# Patient Record
Sex: Female | Born: 1990 | Race: White | Hispanic: No | Marital: Single | Smoking: Former smoker
Health system: Southern US, Community
[De-identification: ages and names within clinical notes are randomized; demographics above are authoritative.]

## PROBLEM LIST (undated history)

## (undated) DIAGNOSIS — G43909 Migraine, unspecified, not intractable, without status migrainosus: Secondary | ICD-10-CM

## (undated) DIAGNOSIS — N939 Abnormal uterine and vaginal bleeding, unspecified: Secondary | ICD-10-CM

## (undated) DIAGNOSIS — R519 Headache, unspecified: Secondary | ICD-10-CM

## (undated) DIAGNOSIS — N200 Calculus of kidney: Secondary | ICD-10-CM

## (undated) DIAGNOSIS — F329 Major depressive disorder, single episode, unspecified: Secondary | ICD-10-CM

## (undated) DIAGNOSIS — G8929 Other chronic pain: Secondary | ICD-10-CM

## (undated) DIAGNOSIS — K219 Gastro-esophageal reflux disease without esophagitis: Secondary | ICD-10-CM

## (undated) DIAGNOSIS — F411 Generalized anxiety disorder: Secondary | ICD-10-CM

## (undated) DIAGNOSIS — K8020 Calculus of gallbladder without cholecystitis without obstruction: Secondary | ICD-10-CM

## (undated) DIAGNOSIS — F32A Depression, unspecified: Secondary | ICD-10-CM

## (undated) DIAGNOSIS — F419 Anxiety disorder, unspecified: Secondary | ICD-10-CM

## (undated) HISTORY — DX: Anxiety disorder, unspecified: F41.9

## (undated) HISTORY — DX: Other chronic pain: G89.29

## (undated) HISTORY — DX: Depression, unspecified: F32.A

## (undated) HISTORY — PX: CHOLECYSTECTOMY: SHX55

## (undated) HISTORY — DX: Calculus of kidney: N20.0

## (undated) HISTORY — DX: Headache, unspecified: R51.9

---

## 2015-02-21 HISTORY — PX: LAPAROSCOPIC CHOLECYSTECTOMY: SUR755

## 2015-02-21 NOTE — L&D Delivery Note (Signed)
Delivery Note At 9:00 PM a viable female was delivered via Vaginal, Spontaneous Delivery (Presentation: Right Occiput Anterior).  APGAR: 9, 9; weight  .   Placenta status: Intact, Spontaneous.  Cord: 3 vessels with the following complications: Short.  Cord pH: n/a  Anesthesia: Epidural  Episiotomy: None Lacerations: None Suture Repair: n/a Est. Blood Loss (mL): 50  Mom to postpartum.  Baby to Couplet care / Skin to Skin.  Catherine Villarreal 08/29/2015, 9:13 PM

## 2015-03-11 LAB — OB RESULTS CONSOLE ABO/RH: RH TYPE: POSITIVE

## 2015-03-11 LAB — OB RESULTS CONSOLE HGB/HCT, BLOOD
HCT: 35 %
Hemoglobin: 11.6 g/dL

## 2015-03-11 LAB — OB RESULTS CONSOLE GC/CHLAMYDIA
Chlamydia: NEGATIVE
Gonorrhea: NEGATIVE

## 2015-03-11 LAB — OB RESULTS CONSOLE RUBELLA ANTIBODY, IGM: Rubella: IMMUNE

## 2015-03-11 LAB — OB RESULTS CONSOLE ANTIBODY SCREEN: ANTIBODY SCREEN: NEGATIVE

## 2015-03-11 LAB — OB RESULTS CONSOLE HEPATITIS B SURFACE ANTIGEN: Hepatitis B Surface Ag: NEGATIVE

## 2015-03-11 LAB — OB RESULTS CONSOLE PLATELET COUNT: Platelets: 249 10*3/uL

## 2015-03-11 LAB — OB RESULTS CONSOLE RPR: RPR: REACTIVE

## 2015-03-11 LAB — OB RESULTS CONSOLE HIV ANTIBODY (ROUTINE TESTING): HIV: NONREACTIVE

## 2015-03-12 LAB — OB RESULTS CONSOLE GC/CHLAMYDIA
CHLAMYDIA, DNA PROBE: NEGATIVE
GC PROBE AMP, GENITAL: NEGATIVE

## 2015-03-12 LAB — OB RESULTS CONSOLE RPR: RPR: NONREACTIVE

## 2015-03-12 LAB — OB RESULTS CONSOLE RUBELLA ANTIBODY, IGM: RUBELLA: IMMUNE

## 2015-03-12 LAB — OB RESULTS CONSOLE HEPATITIS B SURFACE ANTIGEN: Hepatitis B Surface Ag: NEGATIVE

## 2015-03-12 LAB — OB RESULTS CONSOLE HIV ANTIBODY (ROUTINE TESTING): HIV: NONREACTIVE

## 2015-06-02 LAB — OB RESULTS CONSOLE HGB/HCT, BLOOD
HEMATOCRIT: 33 %
Hemoglobin: 10.6 g/dL

## 2015-06-02 LAB — OB RESULTS CONSOLE PLATELET COUNT: PLATELETS: 193 10*3/uL

## 2015-07-29 LAB — OB RESULTS CONSOLE GBS: GBS: NEGATIVE

## 2015-08-25 ENCOUNTER — Encounter (HOSPITAL_COMMUNITY): Payer: Self-pay | Admitting: *Deleted

## 2015-08-25 ENCOUNTER — Inpatient Hospital Stay (HOSPITAL_COMMUNITY)
Admission: AD | Admit: 2015-08-25 | Discharge: 2015-08-25 | Disposition: A | Discharge status: Home / Self Care | Payer: Medicaid Other | Source: Ambulatory Visit | Attending: Obstetrics and Gynecology | Admitting: Obstetrics and Gynecology

## 2015-08-25 DIAGNOSIS — N898 Other specified noninflammatory disorders of vagina: Secondary | ICD-10-CM | POA: Diagnosis not present

## 2015-08-25 DIAGNOSIS — O368130 Decreased fetal movements, third trimester, not applicable or unspecified: Secondary | ICD-10-CM

## 2015-08-25 DIAGNOSIS — O99333 Smoking (tobacco) complicating pregnancy, third trimester: Secondary | ICD-10-CM | POA: Diagnosis present

## 2015-08-25 DIAGNOSIS — O26893 Other specified pregnancy related conditions, third trimester: Secondary | ICD-10-CM | POA: Insufficient documentation

## 2015-08-25 HISTORY — DX: Calculus of gallbladder without cholecystitis without obstruction: K80.20

## 2015-08-25 HISTORY — DX: Migraine, unspecified, not intractable, without status migrainosus: G43.909

## 2015-08-25 NOTE — Discharge Instructions (Signed)
Fetal Movement Counts  Patient Name: __________________________________________________ Patient Due Date: ____________________  Performing a fetal movement count is highly recommended in high-risk pregnancies, but it is good for every pregnant woman to do. Your health care provider may ask you to start counting fetal movements at 28 weeks of the pregnancy. Fetal movements often increase:  · After eating a full meal.  · After physical activity.  · After eating or drinking something sweet or cold.  · At rest.  Pay attention to when you feel the baby is most active. This will help you notice a pattern of your baby's sleep and wake cycles and what factors contribute to an increase in fetal movement. It is important to perform a fetal movement count at the same time each day when your baby is normally most active.   HOW TO COUNT FETAL MOVEMENTS  1. Find a quiet and comfortable area to sit or lie down on your left side. Lying on your left side provides the best blood and oxygen circulation to your baby.  2. Write down the day and time on a sheet of paper or in a journal.  3. Start counting kicks, flutters, swishes, rolls, or jabs in a 2-hour period. You should feel at least 10 movements within 2 hours.  4. If you do not feel 10 movements in 2 hours, wait 2-3 hours and count again. Look for a change in the pattern or not enough counts in 2 hours.  SEEK MEDICAL CARE IF:  · You feel less than 10 counts in 2 hours, tried twice.  · There is no movement in over an hour.  · The pattern is changing or taking longer each day to reach 10 counts in 2 hours.  · You feel the baby is not moving as he or she usually does.  Date: ____________ Movements: ____________ Start time: ____________ Finish time: ____________   Date: ____________ Movements: ____________ Start time: ____________ Finish time: ____________  Date: ____________ Movements: ____________ Start time: ____________ Finish time: ____________  Date: ____________ Movements:  ____________ Start time: ____________ Finish time: ____________  Date: ____________ Movements: ____________ Start time: ____________ Finish time: ____________  Date: ____________ Movements: ____________ Start time: ____________ Finish time: ____________  Date: ____________ Movements: ____________ Start time: ____________ Finish time: ____________  Date: ____________ Movements: ____________ Start time: ____________ Finish time: ____________   Date: ____________ Movements: ____________ Start time: ____________ Finish time: ____________  Date: ____________ Movements: ____________ Start time: ____________ Finish time: ____________  Date: ____________ Movements: ____________ Start time: ____________ Finish time: ____________  Date: ____________ Movements: ____________ Start time: ____________ Finish time: ____________  Date: ____________ Movements: ____________ Start time: ____________ Finish time: ____________  Date: ____________ Movements: ____________ Start time: ____________ Finish time: ____________  Date: ____________ Movements: ____________ Start time: ____________ Finish time: ____________   Date: ____________ Movements: ____________ Start time: ____________ Finish time: ____________  Date: ____________ Movements: ____________ Start time: ____________ Finish time: ____________  Date: ____________ Movements: ____________ Start time: ____________ Finish time: ____________  Date: ____________ Movements: ____________ Start time: ____________ Finish time: ____________  Date: ____________ Movements: ____________ Start time: ____________ Finish time: ____________  Date: ____________ Movements: ____________ Start time: ____________ Finish time: ____________  Date: ____________ Movements: ____________ Start time: ____________ Finish time: ____________   Date: ____________ Movements: ____________ Start time: ____________ Finish time: ____________  Date: ____________ Movements: ____________ Start time: ____________ Finish  time: ____________  Date: ____________ Movements: ____________ Start time: ____________ Finish time: ____________  Date: ____________ Movements: ____________ Start time:   ____________ Finish time: ____________  Date: ____________ Movements: ____________ Start time: ____________ Finish time: ____________  Date: ____________ Movements: ____________ Start time: ____________ Finish time: ____________  Date: ____________ Movements: ____________ Start time: ____________ Finish time: ____________   Date: ____________ Movements: ____________ Start time: ____________ Finish time: ____________  Date: ____________ Movements: ____________ Start time: ____________ Finish time: ____________  Date: ____________ Movements: ____________ Start time: ____________ Finish time: ____________  Date: ____________ Movements: ____________ Start time: ____________ Finish time: ____________  Date: ____________ Movements: ____________ Start time: ____________ Finish time: ____________  Date: ____________ Movements: ____________ Start time: ____________ Finish time: ____________  Date: ____________ Movements: ____________ Start time: ____________ Finish time: ____________   Date: ____________ Movements: ____________ Start time: ____________ Finish time: ____________  Date: ____________ Movements: ____________ Start time: ____________ Finish time: ____________  Date: ____________ Movements: ____________ Start time: ____________ Finish time: ____________  Date: ____________ Movements: ____________ Start time: ____________ Finish time: ____________  Date: ____________ Movements: ____________ Start time: ____________ Finish time: ____________  Date: ____________ Movements: ____________ Start time: ____________ Finish time: ____________  Date: ____________ Movements: ____________ Start time: ____________ Finish time: ____________   Date: ____________ Movements: ____________ Start time: ____________ Finish time: ____________  Date: ____________  Movements: ____________ Start time: ____________ Finish time: ____________  Date: ____________ Movements: ____________ Start time: ____________ Finish time: ____________  Date: ____________ Movements: ____________ Start time: ____________ Finish time: ____________  Date: ____________ Movements: ____________ Start time: ____________ Finish time: ____________  Date: ____________ Movements: ____________ Start time: ____________ Finish time: ____________  Date: ____________ Movements: ____________ Start time: ____________ Finish time: ____________   Date: ____________ Movements: ____________ Start time: ____________ Finish time: ____________  Date: ____________ Movements: ____________ Start time: ____________ Finish time: ____________  Date: ____________ Movements: ____________ Start time: ____________ Finish time: ____________  Date: ____________ Movements: ____________ Start time: ____________ Finish time: ____________  Date: ____________ Movements: ____________ Start time: ____________ Finish time: ____________  Date: ____________ Movements: ____________ Start time: ____________ Finish time: ____________     This information is not intended to replace advice given to you by your health care provider. Make sure you discuss any questions you have with your health care provider.     Document Released: 03/08/2006 Document Revised: 02/27/2014 Document Reviewed: 12/04/2011  Elsevier Interactive Patient Education ©2016 [employer]

## 2015-08-25 NOTE — Progress Notes (Signed)
Prenatal records requested from [facility] in [place]

## 2015-08-25 NOTE — MAU Provider Note (Signed)
  History   Ms. Catherine Villarreal presented with concern for decreased fetal movement starting this morning. She has previously received care in [place], and just moved here. She is not sure whether she may have broken her water today. She denies any complications with this pregnancy. Reports a normal anatomy scan showing a female fetus and denies undergoing any genetic screening.  CSN: [id number]  Arrival date and time: 08/25/15 1657    No chief complaint on file.  HPI  OB History    Gravida Para Term Preterm AB TAB SAB Ectopic Multiple Living   1 0              Past Medical History  Diagnosis Date  . Migraine   . Gallstones     History reviewed. No pertinent past surgical history.  History reviewed. No pertinent family history.  Social History  Substance Use Topics  . Smoking status: Current Every Day Smoker -- 0.25 packs/day    Types: Cigarettes  . Smokeless tobacco: None  . Alcohol Use: No    Allergies: Not on File  No prescriptions prior to admission    Review of Systems  Eyes: Negative for blurred vision.  Respiratory: Negative for shortness of breath.   Neurological: Positive for headaches.   Physical Exam   Blood pressure 120/71, pulse 79, temperature 98.4 F (36.9 C), temperature source Oral, resp. rate 18, height 5\' 7"  (1.702 m), weight 69.854 kg (154 lb).  Physical Exam  Constitutional: She is oriented to person, place, and time. She appears well-nourished. No distress.  Cardiovascular: Normal rate and regular rhythm.   GI: Soft.  Genitourinary: Vagina normal.  Neurological: She is alert and oriented to person, place, and time. She has normal reflexes.    MAU Course  Procedures  MDM Pt presents for decreased FM, but NST is reactive. She reports FM since arrival. Pt reports an uncomplicated pregnancy. Will have her follow up in clinic on 08/31/15.   Assessment and Plan  1. Decreased fetal movement - NST reactive.   2. Concern for LOF - spec exam  negative for pooling, negative ferning.  Loni MuseKate Timberlake 08/25/2015, 5:41 PM   Midwife attestation:  I have seen and examined this patient; I agree with above documentation in the resident's note.   Catherine Villarreal is a 25 y.o. G1P0 reporting decreased FM and ?LOF.  Denies VB, contractions, & vaginal discharge.  PE: BP 120/71 mmHg  Pulse 79  Temp(Src) 98.4 F (36.9 C) (Oral)  Resp 18  Ht 5\' 7"  (1.702 m)  Wt 154 lb (69.854 kg)  BMI 24.11 kg/m2 Gen: calm comfortable, NAD Resp: normal effort, no distress Abd: gravid  ROS, labs, PMH reviewed NST reactive  A/P: [redacted] weeks gestation Reactive NST Leukorrhea Follow up [facility] on 08/31/15 Release signed today for records  Donette LarryMelanie Katara Griner, [place]  7:14 PM

## 2015-08-25 NOTE — Progress Notes (Signed)
Pt being triage for decreased fetal movement and uterine contractions last time that got progressively less painful around 0100--pt states she has had some thick white discharge and some watery discharge

## 2015-08-27 ENCOUNTER — Encounter: Payer: Self-pay | Admitting: *Deleted

## 2015-08-29 ENCOUNTER — Inpatient Hospital Stay (HOSPITAL_COMMUNITY)
Admission: AD | Admit: 2015-08-29 | Discharge: 2015-08-31 | DRG: 775 | Disposition: A | Discharge status: Home / Self Care | Payer: Medicaid Other | Source: Ambulatory Visit | Attending: Obstetrics & Gynecology | Admitting: Obstetrics & Gynecology

## 2015-08-29 ENCOUNTER — Inpatient Hospital Stay (HOSPITAL_COMMUNITY): Payer: Medicaid Other | Admitting: Anesthesiology

## 2015-08-29 ENCOUNTER — Encounter (HOSPITAL_COMMUNITY): Payer: Self-pay | Admitting: Certified Nurse Midwife

## 2015-08-29 DIAGNOSIS — F17210 Nicotine dependence, cigarettes, uncomplicated: Secondary | ICD-10-CM | POA: Diagnosis present

## 2015-08-29 DIAGNOSIS — M419 Scoliosis, unspecified: Secondary | ICD-10-CM | POA: Diagnosis present

## 2015-08-29 DIAGNOSIS — O9989 Other specified diseases and conditions complicating pregnancy, childbirth and the puerperium: Secondary | ICD-10-CM | POA: Diagnosis present

## 2015-08-29 DIAGNOSIS — O99334 Smoking (tobacco) complicating childbirth: Secondary | ICD-10-CM | POA: Diagnosis present

## 2015-08-29 DIAGNOSIS — K219 Gastro-esophageal reflux disease without esophagitis: Secondary | ICD-10-CM | POA: Diagnosis present

## 2015-08-29 DIAGNOSIS — O9962 Diseases of the digestive system complicating childbirth: Secondary | ICD-10-CM | POA: Diagnosis present

## 2015-08-29 DIAGNOSIS — IMO0001 Reserved for inherently not codable concepts without codable children: Secondary | ICD-10-CM

## 2015-08-29 DIAGNOSIS — Z3A40 40 weeks gestation of pregnancy: Secondary | ICD-10-CM | POA: Diagnosis not present

## 2015-08-29 DIAGNOSIS — H7293 Unspecified perforation of tympanic membrane, bilateral: Secondary | ICD-10-CM | POA: Diagnosis present

## 2015-08-29 DIAGNOSIS — O4202 Full-term premature rupture of membranes, onset of labor within 24 hours of rupture: Secondary | ICD-10-CM | POA: Diagnosis present

## 2015-08-29 DIAGNOSIS — Z8719 Personal history of other diseases of the digestive system: Secondary | ICD-10-CM

## 2015-08-29 LAB — CBC
HEMATOCRIT: 31.8 % — AB (ref 36.0–46.0)
Hemoglobin: 11.2 g/dL — ABNORMAL LOW (ref 12.0–15.0)
MCH: 30.5 pg (ref 26.0–34.0)
MCHC: 35.2 g/dL (ref 30.0–36.0)
MCV: 86.6 fL (ref 78.0–100.0)
Platelets: 203 10*3/uL (ref 150–400)
RBC: 3.67 MIL/uL — AB (ref 3.87–5.11)
RDW: 14.2 % (ref 11.5–15.5)
WBC: 12 10*3/uL — AB (ref 4.0–10.5)

## 2015-08-29 LAB — TYPE AND SCREEN
ABO/RH(D): O POS
ANTIBODY SCREEN: NEGATIVE

## 2015-08-29 LAB — ABO/RH: ABO/RH(D): O POS

## 2015-08-29 LAB — RAPID URINE DRUG SCREEN, HOSP PERFORMED
AMPHETAMINES: NOT DETECTED
BARBITURATES: NOT DETECTED
Benzodiazepines: NOT DETECTED
Cocaine: NOT DETECTED
OPIATES: NOT DETECTED
TETRAHYDROCANNABINOL: NOT DETECTED

## 2015-08-29 MED ORDER — ONDANSETRON HCL 4 MG/2ML IJ SOLN: 4.0000 mg | Freq: Four times a day (QID) | INTRAMUSCULAR | Status: DC | PRN

## 2015-08-29 MED ORDER — OXYCODONE-ACETAMINOPHEN 5-325 MG PO TABS: 2.0000 | ORAL_TABLET | ORAL | Status: DC | PRN

## 2015-08-29 MED ORDER — SOD CITRATE-CITRIC ACID 500-334 MG/5ML PO SOLN: 30.0000 mL | ORAL | Status: DC | PRN

## 2015-08-29 MED ORDER — FENTANYL CITRATE (PF) 100 MCG/2ML IJ SOLN: 50.0000 ug | INTRAMUSCULAR | Status: DC | PRN

## 2015-08-29 MED ORDER — EPHEDRINE 5 MG/ML INJ
10.0000 mg | INTRAVENOUS | Status: DC | PRN
  Filled 2015-08-29: qty 2

## 2015-08-29 MED ORDER — TERBUTALINE SULFATE 1 MG/ML IJ SOLN
0.2500 mg | Freq: Once | INTRAMUSCULAR | Status: DC | PRN
Start: 2015-08-29 — End: 2015-08-31
  Filled 2015-08-29: qty 1

## 2015-08-29 MED ORDER — BUTALBITAL-APAP-CAFFEINE 50-325-40 MG PO TABS
1.0000 | ORAL_TABLET | Freq: Three times a day (TID) | ORAL | Status: DC | PRN
  Administered 2015-08-29: 1 via ORAL
  Filled 2015-08-29: qty 1

## 2015-08-29 MED ORDER — LACTATED RINGERS IV SOLN: 500.0000 mL | INTRAVENOUS | Status: DC | PRN

## 2015-08-29 MED ORDER — BENZOCAINE-MENTHOL 20-0.5 % EX AERO
1.0000 | INHALATION_SPRAY | CUTANEOUS | Status: DC | PRN
Start: 2015-08-29 — End: 2015-08-31
  Administered 2015-08-30: 1 via TOPICAL
  Filled 2015-08-29: qty 56

## 2015-08-29 MED ORDER — ONDANSETRON HCL 4 MG/2ML IJ SOLN
4.0000 mg | Freq: Four times a day (QID) | INTRAMUSCULAR | Status: DC | PRN
  Administered 2015-08-29: 4 mg via INTRAVENOUS
  Filled 2015-08-29: qty 2

## 2015-08-29 MED ORDER — SODIUM CHLORIDE 0.9% FLUSH: 3.0000 mL | Freq: Two times a day (BID) | INTRAVENOUS | Status: DC

## 2015-08-29 MED ORDER — DIPHENHYDRAMINE HCL 25 MG PO CAPS: 25.0000 mg | ORAL_CAPSULE | Freq: Four times a day (QID) | ORAL | Status: DC | PRN

## 2015-08-29 MED ORDER — OXYTOCIN 40 UNITS IN LACTATED RINGERS INFUSION - SIMPLE MED: 2.5000 [IU]/h | INTRAVENOUS | Status: DC

## 2015-08-29 MED ORDER — LIDOCAINE HCL (PF) 1 % IJ SOLN
30.0000 mL | INTRAMUSCULAR | Status: DC | PRN
  Filled 2015-08-29: qty 30

## 2015-08-29 MED ORDER — LACTATED RINGERS IV SOLN: INTRAVENOUS | Status: DC

## 2015-08-29 MED ORDER — OXYCODONE-ACETAMINOPHEN 5-325 MG PO TABS: 1.0000 | ORAL_TABLET | ORAL | Status: DC | PRN

## 2015-08-29 MED ORDER — SIMETHICONE 80 MG PO CHEW: 80.0000 mg | CHEWABLE_TABLET | ORAL | Status: DC | PRN

## 2015-08-29 MED ORDER — PHENYLEPHRINE 40 MCG/ML (10ML) SYRINGE FOR IV PUSH (FOR BLOOD PRESSURE SUPPORT)
80.0000 ug | PREFILLED_SYRINGE | INTRAVENOUS | Status: DC | PRN
  Filled 2015-08-29: qty 5

## 2015-08-29 MED ORDER — OXYTOCIN 10 UNIT/ML IJ SOLN: 10.0000 [IU] | Freq: Once | INTRAMUSCULAR | Status: DC

## 2015-08-29 MED ORDER — ACETAMINOPHEN 325 MG PO TABS: 650.0000 mg | ORAL_TABLET | ORAL | Status: DC | PRN

## 2015-08-29 MED ORDER — TETANUS-DIPHTH-ACELL PERTUSSIS 5-2.5-18.5 LF-MCG/0.5 IM SUSP
0.5000 mL | Freq: Once | INTRAMUSCULAR | Status: AC
  Administered 2015-08-31: 0.5 mL via INTRAMUSCULAR
  Filled 2015-08-29: qty 0.5

## 2015-08-29 MED ORDER — LIDOCAINE HCL (PF) 1 % IJ SOLN: 30.0000 mL | INTRAMUSCULAR | Status: DC | PRN

## 2015-08-29 MED ORDER — OXYTOCIN BOLUS FROM INFUSION
500.0000 mL | INTRAVENOUS | Status: DC
  Administered 2015-08-29: 500 mL via INTRAVENOUS

## 2015-08-29 MED ORDER — COCONUT OIL OIL
1.0000 | TOPICAL_OIL | Status: DC | PRN
Start: 2015-08-29 — End: 2015-08-31
  Administered 2015-08-31: 1 via TOPICAL
  Filled 2015-08-29: qty 120

## 2015-08-29 MED ORDER — SODIUM CHLORIDE 0.9% FLUSH: 3.0000 mL | INTRAVENOUS | Status: DC | PRN

## 2015-08-29 MED ORDER — LACTATED RINGERS IV SOLN: 500.0000 mL | Freq: Once | INTRAVENOUS | Status: DC

## 2015-08-29 MED ORDER — ZOLPIDEM TARTRATE 5 MG PO TABS: 5.0000 mg | ORAL_TABLET | Freq: Every evening | ORAL | Status: DC | PRN

## 2015-08-29 MED ORDER — PRENATAL MULTIVITAMIN CH
1.0000 | ORAL_TABLET | Freq: Every day | ORAL | Status: DC
  Administered 2015-08-30 – 2015-08-31 (×2): 1 via ORAL
  Filled 2015-08-29 (×2): qty 1

## 2015-08-29 MED ORDER — SODIUM CHLORIDE 0.9 % IV SOLN: 250.0000 mL | INTRAVENOUS | Status: DC | PRN

## 2015-08-29 MED ORDER — LACTATED RINGERS IV SOLN
INTRAVENOUS | Status: DC
  Administered 2015-08-29: 12:00:00 via INTRAVENOUS

## 2015-08-29 MED ORDER — OXYTOCIN 40 UNITS IN LACTATED RINGERS INFUSION - SIMPLE MED
2.5000 [IU]/h | INTRAVENOUS | Status: DC
Start: 2015-08-29 — End: 2015-08-31

## 2015-08-29 MED ORDER — OXYTOCIN BOLUS FROM INFUSION: 500.0000 mL | INTRAVENOUS | Status: DC

## 2015-08-29 MED ORDER — ONDANSETRON HCL 4 MG/2ML IJ SOLN: 4.0000 mg | INTRAMUSCULAR | Status: DC | PRN

## 2015-08-29 MED ORDER — FENTANYL CITRATE (PF) 100 MCG/2ML IJ SOLN
100.0000 ug | INTRAMUSCULAR | Status: DC | PRN
  Administered 2015-08-29 (×2): 100 ug via INTRAVENOUS
  Filled 2015-08-29 (×2): qty 2

## 2015-08-29 MED ORDER — ACETAMINOPHEN 325 MG PO TABS
650.0000 mg | ORAL_TABLET | ORAL | Status: DC | PRN
  Administered 2015-08-31: 650 mg via ORAL
  Filled 2015-08-29: qty 2

## 2015-08-29 MED ORDER — OXYTOCIN 40 UNITS IN LACTATED RINGERS INFUSION - SIMPLE MED
1.0000 m[IU]/min | INTRAVENOUS | Status: DC
  Administered 2015-08-29: 2 m[IU]/min via INTRAVENOUS
  Filled 2015-08-29: qty 1000

## 2015-08-29 MED ORDER — SENNOSIDES-DOCUSATE SODIUM 8.6-50 MG PO TABS
2.0000 | ORAL_TABLET | ORAL | Status: DC
  Administered 2015-08-29 – 2015-08-30 (×2): 2 via ORAL
  Filled 2015-08-29 (×2): qty 2

## 2015-08-29 MED ORDER — HYDROXYZINE HCL 50 MG PO TABS
50.0000 mg | ORAL_TABLET | Freq: Four times a day (QID) | ORAL | Status: DC | PRN
  Filled 2015-08-29: qty 1

## 2015-08-29 MED ORDER — LIDOCAINE HCL (PF) 1 % IJ SOLN
INTRAMUSCULAR | Status: DC | PRN
  Administered 2015-08-29 (×2): 4 mL via EPIDURAL

## 2015-08-29 MED ORDER — ONDANSETRON HCL 4 MG PO TABS: 4.0000 mg | ORAL_TABLET | ORAL | Status: DC | PRN

## 2015-08-29 MED ORDER — PHENYLEPHRINE 40 MCG/ML (10ML) SYRINGE FOR IV PUSH (FOR BLOOD PRESSURE SUPPORT)
80.0000 ug | PREFILLED_SYRINGE | INTRAVENOUS | Status: DC | PRN
Start: 2015-08-29 — End: 2015-08-31
  Filled 2015-08-29: qty 10
  Filled 2015-08-29: qty 5

## 2015-08-29 MED ORDER — IBUPROFEN 600 MG PO TABS
600.0000 mg | ORAL_TABLET | Freq: Four times a day (QID) | ORAL | Status: DC
  Administered 2015-08-29 – 2015-08-31 (×6): 600 mg via ORAL
  Filled 2015-08-29 (×5): qty 1

## 2015-08-29 MED ORDER — DIPHENHYDRAMINE HCL 50 MG/ML IJ SOLN: 12.5000 mg | INTRAMUSCULAR | Status: DC | PRN

## 2015-08-29 MED ORDER — FENTANYL 2.5 MCG/ML BUPIVACAINE 1/10 % EPIDURAL INFUSION (WH - ANES)
14.0000 mL/h | INTRAMUSCULAR | Status: DC | PRN
  Administered 2015-08-29: 14 mL/h via EPIDURAL
  Filled 2015-08-29: qty 125

## 2015-08-29 MED ORDER — MEASLES, MUMPS & RUBELLA VAC ~~LOC~~ INJ
0.5000 mL | INJECTION | Freq: Once | SUBCUTANEOUS | Status: DC
  Filled 2015-08-29: qty 0.5

## 2015-08-29 NOTE — Anesthesia Procedure Notes (Signed)
Epidural Patient location during procedure: OB Start time: 08/29/2015 4:38 PM  Staffing Anesthesiologist: Mal AmabileFOSTER, Izela Altier Performed by: anesthesiologist   Preanesthetic Checklist Completed: patient identified, site marked, surgical consent, pre-op evaluation, timeout performed, IV checked, risks and benefits discussed and monitors and equipment checked  Epidural Patient position: sitting Prep: site prepped and draped and DuraPrep Patient monitoring: continuous pulse ox and blood pressure Approach: midline Location: L3-L4 Injection technique: LOR air  Needle:  Needle type: Tuohy  Needle gauge: 17 G Needle length: 9 cm and 9 Needle insertion depth: 4 cm Catheter type: closed end flexible Catheter size: 19 Gauge Catheter at skin depth: 9 cm Test dose: negative and Other  Assessment Events: blood not aspirated, injection not painful, no injection resistance, negative IV test and no paresthesia  Additional Notes Patient identified. Risks and benefits discussed including failed block, incomplete  Pain control, post dural puncture headache, nerve damage, paralysis, blood pressure Changes, nausea, vomiting, reactions to medications-both toxic and allergic and post Partum back pain. All questions were answered. Patient expressed understanding and wished to proceed. Sterile technique was used throughout procedure. Epidural site was Dressed with sterile barrier dressing. No paresthesias, signs of intravascular injection Or signs of intrathecal spread were encountered.  Patient was more comfortable after the epidural was dosed. Please see RN's note for documentation of vital signs and FHR which are stable.

## 2015-08-29 NOTE — H&P (Signed)
Catherine Villarreal is a 25 y.o. female presenting for SROM at 0730 fluid was clear. Benign medical and surgical history Maternal Medical History:  Reason for admission: Rupture of membranes.   Contractions: Onset was 3-5 hours ago.   Frequency: irregular.   Perceived severity is mild.    Fetal activity: Perceived fetal activity is normal.   Last perceived fetal movement was within the past hour.    Prenatal complications: no prenatal complications Prenatal Complications - Diabetes: none.    OB History    Gravida Para Term Preterm AB TAB SAB Ectopic Multiple Living   2 0             Past Medical History  Diagnosis Date  . Migraine   . Gallstones    History reviewed. No pertinent past surgical history. Family History: family history is not on file. Social History:  reports that she has been smoking Cigarettes.  She has been smoking about 0.25 packs per day. She does not have any smokeless tobacco history on file. She reports that she does not drink alcohol or use illicit drugs.   Prenatal Transfer Tool  Maternal Diabetes: No Genetic Screening: Normal Maternal Ultrasounds/Referrals: Normal Fetal Ultrasounds or other Referrals:  None Maternal Substance Abuse:  No Significant Maternal Medications:  None Significant Maternal Lab Results:  None Other Comments:  None  Review of Systems  Constitutional: Negative.   HENT: Negative.   Eyes: Negative.   Respiratory: Negative.   Cardiovascular: Negative.   Gastrointestinal: Positive for abdominal pain.  Genitourinary: Negative.   Musculoskeletal: Negative.   Skin: Negative.   Neurological: Negative.   Endo/Heme/Allergies: Negative.   Psychiatric/Behavioral: Negative.     Dilation: 1 Effacement (%): 90 Station: -2 Exam by:: A. Jones RN Blood pressure 114/68, pulse 79, resp. rate 18. Maternal Exam:  Uterine Assessment: Contraction strength is mild.  Contraction frequency is irregular.   Abdomen: Patient reports no abdominal  tenderness. Fetal presentation: vertex  Introitus: Normal vulva. Pelvis: adequate for delivery.   Cervix: Cervix evaluated by digital exam.     Fetal Exam Fetal Monitor Review: Mode: ultrasound.   Variability: moderate (6-25 bpm).   Pattern: accelerations present.    Fetal State Assessment: Category II - tracings are indeterminate.     Physical Exam  Constitutional: She is oriented to person, place, and time. She appears well-developed and well-nourished.  HENT:  Head: Normocephalic.  Eyes: Pupils are equal, round, and reactive to light.  Neck: Normal range of motion.  Cardiovascular: Normal rate, regular rhythm, normal heart sounds and intact distal pulses.   Respiratory: Effort normal and breath sounds normal.  GI: Soft. Bowel sounds are normal.  Genitourinary: Vagina normal and uterus normal.  Musculoskeletal: Normal range of motion.  Neurological: She is alert and oriented to person, place, and time. She has normal reflexes.  Skin: Skin is warm and dry.  Psychiatric: She has a normal mood and affect. Her behavior is normal. Judgment and thought content normal.    Prenatal labs: ABO, Rh:   Antibody:   Rubella:   RPR:    HBsAg:    HIV:    GBS: Negative (06/08 0000)   Assessment/Plan: SROM @ 40 wks, GBS neg, sparse prenatal care. Admit. SVE  1/90/-2  Catherine Villarreal 08/29/2015, 11:25 AM

## 2015-08-29 NOTE — Anesthesia Pain Management Evaluation Note (Signed)
  CRNA Pain Management Visit Note  Patient: Catherine Villarreal, 25 y.o., female  "Hello I am a member of the anesthesia team at [facility]. We have an anesthesia team available at all times to provide care throughout the hospital, including epidural management and anesthesia for C-section. I don't know your plan for the delivery whether it a natural birth, water birth, IV sedation, nitrous supplementation, doula or epidural, but we want to meet your pain goals."   1.Was your pain managed to your expectations on prior hospitalizations?     2.What is your expectation for pain management during this hospitalization?     3.How can we help you reach that goal?   Record the patient's initial score and the patient's pain goal.   Pain: 4  Pain Goal: 5 [facility] wants you to be able to say your pain was always managed very well.  Laban EmperorMalinova,Catherine Villarreal 08/29/2015

## 2015-08-29 NOTE — Anesthesia Preprocedure Evaluation (Addendum)
Anesthesia Evaluation  Patient identified by MRN, date of birth, ID band Patient awake    Reviewed: Allergy & Precautions, Patient's Chart, lab work & pertinent test results  Airway Mallampati: III  TM Distance: >3 FB Neck ROM: Full    Dental no notable dental hx. (+) Teeth Intact   Pulmonary Current Smoker,    Pulmonary exam normal breath sounds clear to auscultation       Cardiovascular negative cardio ROS Normal cardiovascular exam Rhythm:Regular Rate:Normal     Neuro/Psych  Headaches, negative psych ROS   GI/Hepatic Neg liver ROS, GERD  ,Cholelithiasis   Endo/Other  negative endocrine ROS  Renal/GU negative Renal ROS  negative genitourinary   Musculoskeletal Scoliosis   Abdominal   Peds  Hematology  (+) anemia ,   Anesthesia Other Findings   Reproductive/Obstetrics (+) Pregnancy                            Anesthesia Physical Anesthesia Plan  ASA: II  Anesthesia Plan: Epidural   Post-op Pain Management:    Induction:   Airway Management Planned: Natural Airway  Additional Equipment:   Intra-op Plan:   Post-operative Plan:   Informed Consent: I have reviewed the patients History and Physical, chart, labs and discussed the procedure including the risks, benefits and alternatives for the proposed anesthesia with the patient or authorized representative who has indicated his/her understanding and acceptance.   Dental advisory given  Plan Discussed with: Anesthesiologist  Anesthesia Plan Comments:         Anesthesia Quick Evaluation

## 2015-08-29 NOTE — MAU Note (Signed)
Water broke about 3 hours ago now having contractions

## 2015-08-30 DIAGNOSIS — Z590 Homelessness: Secondary | ICD-10-CM

## 2015-08-30 DIAGNOSIS — Z8719 Personal history of other diseases of the digestive system: Secondary | ICD-10-CM

## 2015-08-30 DIAGNOSIS — O99334 Smoking (tobacco) complicating childbirth: Secondary | ICD-10-CM

## 2015-08-30 DIAGNOSIS — Z3A33 33 weeks gestation of pregnancy: Secondary | ICD-10-CM

## 2015-08-30 DIAGNOSIS — O9962 Diseases of the digestive system complicating childbirth: Secondary | ICD-10-CM

## 2015-08-30 DIAGNOSIS — O9989 Other specified diseases and conditions complicating pregnancy, childbirth and the puerperium: Secondary | ICD-10-CM

## 2015-08-30 DIAGNOSIS — O4202 Full-term premature rupture of membranes, onset of labor within 24 hours of rupture: Secondary | ICD-10-CM

## 2015-08-30 LAB — RPR: RPR Ser Ql: NONREACTIVE

## 2015-08-30 MED ORDER — DIBUCAINE 1 % RE OINT
TOPICAL_OINTMENT | RECTAL | Status: DC | PRN
  Administered 2015-08-30: 21:00:00 via RECTAL
  Filled 2015-08-30: qty 28

## 2015-08-30 NOTE — Progress Notes (Signed)
Post Partum Day 1 Subjective: no complaints, up ad lib, voiding and tolerating PO  Objective: Blood pressure 103/53, pulse 84, temperature 98.5 F (36.9 C), temperature source Oral, resp. rate 18, height 5\' 7"  (1.702 m), weight 155 lb (70.308 kg), SpO2 100 %, unknown if currently breastfeeding.  Physical Exam:  General: alert, cooperative, appears stated age and no distress Lochia: appropriate Uterine Fundus: firm Incision: n/a DVT Evaluation: No evidence of DVT seen on physical exam. Negative Homan's sign. No cords or calf tenderness.   Recent Labs  08/29/15 1155  HGB 11.2*  HCT 31.8*    Assessment/Plan: Plan for discharge tomorrow   LOS: 1 day   Wyvonnia DuskyMarie Brady Plant 08/30/2015, 5:48 AM

## 2015-08-30 NOTE — Progress Notes (Signed)
UR chart review completed.  

## 2015-08-30 NOTE — Anesthesia Postprocedure Evaluation (Signed)
Anesthesia Post Note  Patient: Catherine Villarreal  Procedure(s) Performed: * No procedures listed *  Patient location during evaluation: Mother Baby Anesthesia Type: Epidural Level of consciousness: awake and alert Pain management: pain level controlled Vital Signs Assessment: post-procedure vital signs reviewed and stable Respiratory status: spontaneous breathing, nonlabored ventilation and respiratory function stable Cardiovascular status: stable Postop Assessment: no headache, no backache and epidural receding Anesthetic complications: no     Last Vitals:  Filed Vitals:   08/30/15 0340 08/30/15 0609  BP: 116/61 95/49  Pulse: 72 77  Temp: 36.8 C   Resp: 18 18    Last Pain:  Filed Vitals:   08/30/15 0703  PainSc: 3    Pain Goal: Patients Stated Pain Goal: 4 (08/30/15 0700)               Junious SilkGILBERT,Kahealani Yankovich

## 2015-08-30 NOTE — Progress Notes (Signed)
Pt has a large cluster of hemorrhoids on assessment. Pt reports discomfort from hemorrhoids unrelieved with use of dermaplast spray and tucks pad. Dr. Ottie GlazierGunadasa called and order for dibucaine obtained.

## 2015-08-30 NOTE — Lactation Note (Signed)
This note was copied from a baby's chart. Lactation Consultation Note New mom w/flat nipples, tiny nipples. Has full feeling breast, RN gave shells, hand expressed glistening of colostrum. RN fitted mom w/#16 NS. Good fit. Mom states she has put the baby to bare breast after wearing NS a few hours. Breast not compressible. Encouraged to wear NS, Referred to Baby and Me Book in Breastfeeding section Pg. 22-23 for position options and Proper latch demonstration. Mom encouraged to feed baby 8-12 times/24 hours and with feeding cues.  Educated about newborn behavior, STS, I&O, cluster feeding, supply and demand. Referred to Baby and Me Book in Breastfeeding section Pg. 22-23 for position options and Proper latch demonstration. Patient Name: Catherine Villarreal [id number]'s Date: 08/30/2015 Reason for consult: Initial assessment   Maternal Data Has patient been taught Hand Expression?: Yes Does the patient have breastfeeding experience prior to this delivery?: No  Feeding Feeding Type: Breast Fed Length of feed: 10 min  LATCH Score/Interventions       Type of Nipple: Flat Intervention(s): Shells  Comfort (Breast/Nipple): Soft / non-tender     Intervention(s): Breastfeeding basics reviewed;Support Pillows;Position options;Skin to skin     Lactation Tools Discussed/Used Tools: Shells;Nipple Dorris CarnesShields;Pump Nipple shield size: 16 Shell Type: Inverted WIC Program: Yes   Consult Status Consult Status: Follow-up Date: 08/31/15 Follow-up type: In-patient    Charyl DancerCARVER, Barak Bialecki G 08/30/2015, 7:35 AM

## 2015-08-31 ENCOUNTER — Encounter: Payer: Self-pay | Admitting: Advanced Practice Midwife

## 2015-08-31 MED ORDER — DOCUSATE SODIUM 100 MG PO CAPS: 100.0000 mg | ORAL_CAPSULE | Freq: Two times a day (BID) | ORAL | Status: DC

## 2015-08-31 MED ORDER — IBUPROFEN 600 MG PO TABS: 600.0000 mg | ORAL_TABLET | Freq: Four times a day (QID) | ORAL | Status: DC | PRN

## 2015-08-31 NOTE — Discharge Instructions (Signed)
Breastfeeding °Deciding to breastfeed is one of the best choices you can make for you and your baby. A change in hormones during pregnancy causes your breast tissue to grow and increases the number and size of your milk ducts. These hormones also allow proteins, sugars, and fats from your blood supply to make breast milk in your milk-producing glands. Hormones prevent breast milk from being released before your baby is born as well as prompt milk flow after birth. Once breastfeeding has begun, thoughts of your baby, as well as his or her sucking or crying, can stimulate the release of milk from your milk-producing glands.  °BENEFITS OF BREASTFEEDING °For Your Baby °· Your first milk (colostrum) helps your baby's digestive system function better. °· There are antibodies in your milk that help your baby fight off infections. °· Your baby has a lower incidence of asthma, allergies, and sudden infant death syndrome. °· The nutrients in breast milk are better for your baby than infant formulas and are designed uniquely for your baby's needs. °· Breast milk improves your baby's brain development. °· Your baby is less likely to develop other conditions, such as childhood obesity, asthma, or type 2 diabetes mellitus. °For You °· Breastfeeding helps to create a very special bond between you and your baby. °· Breastfeeding is convenient. Breast milk is always available at the correct temperature and costs nothing. °· Breastfeeding helps to burn calories and helps you lose the weight gained during pregnancy. °· Breastfeeding makes your uterus contract to its prepregnancy size faster and slows bleeding (lochia) after you give birth.   °· Breastfeeding helps to lower your risk of developing type 2 diabetes mellitus, osteoporosis, and breast or ovarian cancer later in life. °SIGNS THAT YOUR BABY IS HUNGRY °Early Signs of Hunger °· Increased alertness or activity. °· Stretching. °· Movement of the head from side to  side. °· Movement of the head and opening of the mouth when the corner of the mouth or cheek is stroked (rooting). °· Increased sucking sounds, smacking lips, cooing, sighing, or squeaking. °· Hand-to-mouth movements. °· Increased sucking of fingers or hands. °Late Signs of Hunger °· Fussing. °· Intermittent crying. °Extreme Signs of Hunger °Signs of extreme hunger will require calming and consoling before your baby will be able to breastfeed successfully. Do not wait for the following signs of extreme hunger to occur before you initiate breastfeeding: °· Restlessness. °· A loud, strong cry. °· Screaming. °BREASTFEEDING BASICS °Breastfeeding Initiation °· Find a comfortable place to sit or lie down, with your neck and back well supported. °· Place a pillow or rolled up blanket under your baby to bring him or her to the level of your breast (if you are seated). Nursing pillows are specially designed to help support your arms and your baby while you breastfeed. °· Make sure that your baby's abdomen is facing your abdomen. °· Gently massage your breast. With your fingertips, massage from your chest wall toward your nipple in a circular motion. This encourages milk flow. You may need to continue this action during the feeding if your milk flows slowly. °· Support your breast with 4 fingers underneath and your thumb above your nipple. Make sure your fingers are well away from your nipple and your baby's mouth. °· Stroke your baby's lips gently with your finger or nipple. °· When your baby's mouth is open wide enough, quickly bring your baby to your breast, placing your entire nipple and as much of the colored area around your nipple (  areola) as possible into your baby's mouth. °¨ More areola should be visible above your baby's upper lip than below the lower lip. °¨ Your baby's tongue should be between his or her lower gum and your breast. °· Ensure that your baby's mouth is correctly positioned around your nipple  (latched). Your baby's lips should create a seal on your breast and be turned out (everted). °· It is common for your baby to suck about 2-3 minutes in order to start the flow of breast milk. °Latching °Teaching your baby how to latch on to your breast properly is very important. An improper latch can cause nipple pain and decreased milk supply for you and poor weight gain in your baby. Also, if your baby is not latched onto your nipple properly, he or she may swallow some air during feeding. This can make your baby fussy. Burping your baby when you switch breasts during the feeding can help to get rid of the air. However, teaching your baby to latch on properly is still the best way to prevent fussiness from swallowing air while breastfeeding. °Signs that your baby has successfully latched on to your nipple: °· Silent tugging or silent sucking, without causing you pain. °· Swallowing heard between every 3-4 sucks. °· Muscle movement above and in front of his or her ears while sucking. °Signs that your baby has not successfully latched on to nipple: °· Sucking sounds or smacking sounds from your baby while breastfeeding. °· Nipple pain. °If you think your baby has not latched on correctly, slip your finger into the corner of your baby's mouth to break the suction and place it between your baby's gums. Attempt breastfeeding initiation again. °Signs of Successful Breastfeeding °Signs from your baby: °· A gradual decrease in the number of sucks or complete cessation of sucking. °· Falling asleep. °· Relaxation of his or her body. °· Retention of a small amount of milk in his or her mouth. °· Letting go of your breast by himself or herself. °Signs from you: °· Breasts that have increased in firmness, weight, and size 1-3 hours after feeding. °· Breasts that are softer immediately after breastfeeding. °· Increased milk volume, as well as a change in milk consistency and color by the fifth day of breastfeeding. °· Nipples  that are not sore, cracked, or bleeding. °Signs That Your Baby is Getting Enough Milk °· Wetting at least 3 diapers in a 24-hour period. The urine should be clear and pale yellow by age 5 days. °· At least 3 stools in a 24-hour period by age 5 days. The stool should be soft and yellow. °· At least 3 stools in a 24-hour period by age 7 days. The stool should be seedy and yellow. °· No loss of weight greater than 10% of birth weight during the first 3 days of age. °· Average weight gain of 4-7 ounces (113-198 g) per week after age 4 days. °· Consistent daily weight gain by age 5 days, without weight loss after the age of 2 weeks. °After a feeding, your baby may spit up a small amount. This is common. °BREASTFEEDING FREQUENCY AND DURATION °Frequent feeding will help you make more milk and can prevent sore nipples and breast engorgement. Breastfeed when you feel the need to reduce the fullness of your breasts or when your baby shows signs of hunger. This is called "breastfeeding on demand." Avoid introducing a pacifier to your baby while you are working to establish breastfeeding (the first 4-6 weeks   after your baby is born). After this time you may choose to use a pacifier. Research has shown that pacifier use during the first year of a baby's life decreases the risk of sudden infant death syndrome (SIDS). °Allow your baby to feed on each breast as long as he or she wants. Breastfeed until your baby is finished feeding. When your baby unlatches or falls asleep while feeding from the first breast, offer the second breast. Because newborns are often sleepy in the first few weeks of life, you may need to awaken your baby to get him or her to feed. °Breastfeeding times will vary from baby to baby. However, the following rules can serve as a guide to help you ensure that your baby is properly fed: °· Newborns (babies 4 weeks of age or younger) may breastfeed every 1-3 hours. °· Newborns should not go longer than 3 hours  during the day or 5 hours during the night without breastfeeding. °· You should breastfeed your baby a minimum of 8 times in a 24-hour period until you begin to introduce solid foods to your baby at around 6 months of age. °BREAST MILK PUMPING °Pumping and storing breast milk allows you to ensure that your baby is exclusively fed your breast milk, even at times when you are unable to breastfeed. This is especially important if you are going back to work while you are still breastfeeding or when you are not able to be present during feedings. Your lactation consultant can give you guidelines on how long it is safe to store breast milk. °A breast pump is a machine that allows you to pump milk from your breast into a sterile bottle. The pumped breast milk can then be stored in a refrigerator or freezer. Some breast pumps are operated by hand, while others use electricity. Ask your lactation consultant which type will work best for you. Breast pumps can be purchased, but some hospitals and breastfeeding support groups lease breast pumps on a monthly basis. A lactation consultant can teach you how to hand express breast milk, if you prefer not to use a pump. °CARING FOR YOUR BREASTS WHILE YOU BREASTFEED °Nipples can become dry, cracked, and sore while breastfeeding. The following recommendations can help keep your breasts moisturized and healthy: °· Avoid using soap on your nipples. °· Wear a supportive bra. Although not required, special nursing bras and tank tops are designed to allow access to your breasts for breastfeeding without taking off your entire bra or top. Avoid wearing underwire-style bras or extremely tight bras. °· Air dry your nipples for 3-4 minutes after each feeding. °· Use only cotton bra pads to absorb leaked breast milk. Leaking of breast milk between feedings is normal. °· Use lanolin on your nipples after breastfeeding. Lanolin helps to maintain your skin's normal moisture barrier. If you use  pure lanolin, you do not need to wash it off before feeding your baby again. Pure lanolin is not toxic to your baby. You may also hand express a few drops of breast milk and gently massage that milk into your nipples and allow the milk to air dry. °In the first few weeks after giving birth, some women experience extremely full breasts (engorgement). Engorgement can make your breasts feel heavy, warm, and tender to the touch. Engorgement peaks within 3-5 days after you give birth. The following recommendations can help ease engorgement: °· Completely empty your breasts while breastfeeding or pumping. You may want to start by applying warm, moist heat (in   the shower or with warm water-soaked hand towels) just before feeding or pumping. This increases circulation and helps the milk flow. If your baby does not completely empty your breasts while breastfeeding, pump any extra milk after he or she is finished. °· Wear a snug bra (nursing or regular) or tank top for 1-2 days to signal your body to slightly decrease milk production. °· Apply ice packs to your breasts, unless this is too uncomfortable for you. °· Make sure that your baby is latched on and positioned properly while breastfeeding. °If engorgement persists after 48 hours of following these recommendations, contact your health care provider or a lactation consultant. °OVERALL HEALTH CARE RECOMMENDATIONS WHILE BREASTFEEDING °· Eat healthy foods. Alternate between meals and snacks, eating 3 of each per day. Because what you eat affects your breast milk, some of the foods may make your baby more irritable than usual. Avoid eating these foods if you are sure that they are negatively affecting your baby. °· Drink milk, fruit juice, and water to satisfy your thirst (about 10 glasses a day). °· Rest often, relax, and continue to take your prenatal vitamins to prevent fatigue, stress, and anemia. °· Continue breast self-awareness checks. °· Avoid chewing and smoking  tobacco. Chemicals from cigarettes that pass into breast milk and exposure to secondhand smoke may harm your baby. °· Avoid alcohol and drug use, including marijuana. °Some medicines that may be harmful to your baby can pass through breast milk. It is important to ask your health care provider before taking any medicine, including all over-the-counter and prescription medicine as well as vitamin and herbal supplements. °It is possible to become pregnant while breastfeeding. If birth control is desired, ask your health care provider about options that will be safe for your baby. °SEEK MEDICAL CARE IF: °· You feel like you want to stop breastfeeding or have become frustrated with breastfeeding. °· You have painful breasts or nipples. °· Your nipples are cracked or bleeding. °· Your breasts are red, tender, or warm. °· You have a swollen area on either breast. °· You have a fever or chills. °· You have nausea or vomiting. °· You have drainage other than breast milk from your nipples. °· Your breasts do not become full before feedings by the fifth day after you give birth. °· You feel sad and depressed. °· Your baby is too sleepy to eat well. °· Your baby is having trouble sleeping.   °· Your baby is wetting less than 3 diapers in a 24-hour period. °· Your baby has less than 3 stools in a 24-hour period. °· Your baby's skin or the white part of his or her eyes becomes yellow.   °· Your baby is not gaining weight by 5 days of age. °SEEK IMMEDIATE MEDICAL CARE IF: °· Your baby is overly tired (lethargic) and does not want to wake up and feed. °· Your baby develops an unexplained fever. °  °This information is not intended to replace advice given to you by your health care provider. Make sure you discuss any questions you have with your health care provider. °  °Document Released: 02/06/2005 Document Revised: 10/28/2014 Document Reviewed: 07/31/2012 °Elsevier Interactive Patient Education ©2016 [employer] °Vaginal  Delivery, Care After °Refer to this sheet in the next few weeks. These discharge instructions provide you with information on caring for yourself after delivery. Your health care provider may also give you specific instructions. Your treatment has been planned according to the most current medical practices available, but   problems sometimes occur. Call your health care provider if you have any problems or questions after you go home. °HOME CARE INSTRUCTIONS °· Take over-the-counter or prescription medicines only as directed by your health care provider or pharmacist. °· Do not drink alcohol, especially if you are breastfeeding or taking medicine to relieve pain. °· Do not chew or smoke tobacco. °· Do not use illegal drugs. °· Continue to use good perineal care. Good perineal care includes: °· Wiping your perineum from front to back. °· Keeping your perineum clean. °· Do not use tampons or douche until your health care provider says it is okay. °· Shower, wash your hair, and take tub baths as directed by your health care provider. °· Wear a well-fitting bra that provides breast support. °· Eat healthy foods. °· Drink enough fluids to keep your urine clear or pale yellow. °· Eat high-fiber foods such as whole grain cereals and breads, brown rice, beans, and fresh fruits and vegetables every day. These foods may help prevent or relieve constipation. °· Follow your health care provider's recommendations regarding resumption of activities such as climbing stairs, driving, lifting, exercising, or traveling. °· Talk to your health care provider about resuming sexual activities. Resumption of sexual activities is dependent upon your risk of infection, your rate of healing, and your comfort and desire to resume sexual activity. °· Try to have someone help you with your household activities and your newborn for at least a few days after you leave the hospital. °· Rest as much as possible. Try to rest or take a nap when your  newborn is sleeping. °· Increase your activities gradually. °· Keep all of your scheduled postpartum appointments. It is very important to keep your scheduled follow-up appointments. At these appointments, your health care provider will be checking to make sure that you are healing physically and emotionally. °SEEK MEDICAL CARE IF:  °· You are passing large clots from your vagina. Save any clots to show your health care provider. °· You have a foul smelling discharge from your vagina. °· You have trouble urinating. °· You are urinating frequently. °· You have pain when you urinate. °· You have a change in your bowel movements. °· You have increasing redness, pain, or swelling near your vaginal incision (episiotomy) or vaginal tear. °· You have pus draining from your episiotomy or vaginal tear. °· Your episiotomy or vaginal tear is separating. °· You have painful, hard, or reddened breasts. °· You have a severe headache. °· You have blurred vision or see spots. °· You feel sad or depressed. °· You have thoughts of hurting yourself or your newborn. °· You have questions about your care, the care of your newborn, or medicines. °· You are dizzy or light-headed. °· You have a rash. °· You have nausea or vomiting. °· You were breastfeeding and have not had a menstrual period within 12 weeks after you stopped breastfeeding. °· You are not breastfeeding and have not had a menstrual period by the 12th week after delivery. °· You have a fever. °SEEK IMMEDIATE MEDICAL CARE IF:  °· You have persistent pain. °· You have chest pain. °· You have shortness of breath. °· You faint. °· You have leg pain. °· You have stomach pain. °· Your vaginal bleeding saturates two or more sanitary pads in 1 hour. °  °This information is not intended to replace advice given to you by your health care provider. Make sure you discuss any questions you have with your health   care provider. °  °Document Released: 02/04/2000 Document Revised: 10/28/2014  Document Reviewed: 10/04/2011 °Elsevier Interactive Patient Education ©2016 [employer] ° °

## 2015-08-31 NOTE — Discharge Summary (Signed)
OB Discharge Summary     Patient Name: Catherine Villarreal DOB: [birth year] MRN: [id number]  Date of admission: 08/29/2015 Delivering MD: Wyvonnia DuskyLAWSON, MARIE D   Date of discharge: 08/31/2015  Admitting diagnosis: 39 WEEKS TODAY IS HER DUE DATE, WATER BROKE Intrauterine pregnancy: 4054w0d     Secondary diagnosis:  Active Problems:   Rupture of both tympanic membranes   Active labor  Additional problems: none     Discharge diagnosis: Term Pregnancy Delivered                                                                                                Post partum procedures:none  Augmentation: none  Complications: None  Hospital course:  Onset of Labor With Vaginal Delivery     25 y.o. yo G2P1001 at 7554w0d was admitted in Active Labor on 08/29/2015. Patient had an uncomplicated labor course as follows:  Membrane Rupture Time/Date: 7:00 AM ,08/29/2015   Intrapartum Procedures: Episiotomy: None [1]                                         Lacerations:  None [1]  Patient had a delivery of a Viable infant. 08/29/2015  Information for the patient's newborn:  Quinn PlowmanDavis, Boy Tannia [[id number]][[id number]]  Delivery Method: Vaginal, Spontaneous Delivery (Filed from Delivery Summary)    Pateint had an uncomplicated postpartum course.  She is ambulating, tolerating a regular diet, passing flatus, and urinating well. Patient is discharged home in stable condition on 08/31/2015.    Physical exam  Filed Vitals:   08/30/15 0609 08/30/15 0841 08/30/15 1709 08/31/15 0545  BP: 95/49 101/61 100/56 110/74  Pulse: 77 59 73 69  Temp:  98.8 F (37.1 C) 98.6 F (37 C) 98.3 F (36.8 C)  TempSrc:  Oral Oral   Resp: 18 18 17 18   Height:      Weight:      SpO2:       General: alert, cooperative and no distress Lochia: appropriate Uterine Fundus: firm Incision: N/A DVT Evaluation: No evidence of DVT seen on physical exam. No significant calf/ankle edema. Labs: Lab Results  Component Value Date   WBC 12.0* 08/29/2015   HGB  11.2* 08/29/2015   HCT 31.8* 08/29/2015   MCV 86.6 08/29/2015   PLT 203 08/29/2015   No flowsheet data found.  Discharge instruction: per After Visit Summary and "Baby and Me Booklet".  After visit meds:    Medication List    ASK your doctor about these medications        acetaminophen 500 MG tablet  Commonly known as:  TYLENOL  Take 1,000 mg by mouth every 6 (six) hours as needed for mild pain.     butalbital-aspirin-caffeine 50-325-40 MG capsule  Commonly known as:  FIORINAL  Take 1 capsule by mouth every 6 (six) hours as needed for headache.     calcium carbonate 500 MG chewable tablet  Commonly known as:  TUMS - dosed in mg elemental calcium  Chew 2 tablets by  mouth 3 (three) times daily as needed for indigestion or heartburn.        Diet: routine diet  Activity: Advance as tolerated. Pelvic rest for 6 weeks.   Outpatient follow up:6 weeks Follow up Appt:No future appointments. Follow up Visit:No Follow-up on file.  Postpartum contraception: Nexplanon  Newborn Data: Live born female  Birth Weight: 7 lb 13.8 oz (3565 g) APGAR: 9, 9  Baby Feeding: Breast Disposition:home with mother   08/31/2015 Loni Muse, MD  OB FELLOW DISCHARGE ATTESTATION  I have seen and examined this patient and agree with above documentation in the resident's note.   Silvano Bilis, MD 3:55 PM

## 2015-08-31 NOTE — Lactation Note (Signed)
This note was copied from a baby's chart. Lactation Consultation Note  Patient Name: Catherine Villarreal: 08/31/2015    Follow up visit with mom prior to discharge.  Spoke to [employer] RN and she feels 16 mm nipple shield may be too tight.  Mom c/o sore nipples.  Small positional stripe noted on left nipple.  Baby has been cluster feeding this AM.  Parents also started formula supplementation so baby would be more satisfied.  Discussed milk coming to volume and cautioned on the possibility of formula decreasing supply.  Baby is currently showing feeding cues but mom does not want to put baby to breast due to soreness.  FOB will supplement baby.  20 mm nipple shield left to try with next breastfeeding.  Comfort gels given with instructions.  Lactation outpatient services and support reviewed and encouraged.   Maternal Data    Feeding Feeding Type: Breast Fed Length of feed: 15 min  LATCH Score/Interventions                      Lactation Tools Discussed/Used     Consult Status      Huston FoleyMOULDEN, Labrisha Wuellner S 08/31/2015, 9:42 AM

## 2015-09-01 ENCOUNTER — Encounter: Payer: Self-pay | Admitting: *Deleted

## 2015-10-13 ENCOUNTER — Ambulatory Visit (INDEPENDENT_AMBULATORY_CARE_PROVIDER_SITE_OTHER): Payer: Medicaid Other | Admitting: Advanced Practice Midwife

## 2015-10-13 ENCOUNTER — Encounter: Payer: Self-pay | Admitting: Advanced Practice Midwife

## 2015-10-13 DIAGNOSIS — Z3009 Encounter for other general counseling and advice on contraception: Secondary | ICD-10-CM

## 2015-10-13 DIAGNOSIS — Z309 Encounter for contraceptive management, unspecified: Secondary | ICD-10-CM

## 2015-10-13 DIAGNOSIS — K8020 Calculus of gallbladder without cholecystitis without obstruction: Secondary | ICD-10-CM

## 2015-10-13 NOTE — Patient Instructions (Addendum)
[facility]  [phone] [address] [place], [place]  Etonogestrel implant What is this medicine? ETONOGESTREL (et oh noe JES trel) is a contraceptive (birth control) device. It is used to prevent pregnancy. It can be used for up to 3 years. This medicine may be used for other purposes; ask your health care provider or pharmacist if you have questions. What should I tell my health care provider before I take this medicine? They need to know if you have any of these conditions: -abnormal vaginal bleeding -blood vessel disease or blood clots -cancer of the breast, cervix, or liver -depression -diabetes -gallbladder disease -headaches -heart disease or recent heart attack -high blood pressure -high cholesterol -kidney disease -liver disease -renal disease -seizures -tobacco smoker -an unusual or allergic reaction to etonogestrel, other hormones, anesthetics or antiseptics, medicines, foods, dyes, or preservatives -pregnant or trying to get pregnant -breast-feeding How should I use this medicine? This device is inserted just under the skin on the inner side of your upper arm by a health care professional. Talk to your pediatrician regarding the use of this medicine in children. Special care may be needed. Overdosage: If you think you have taken too much of this medicine contact a poison control center or emergency room at once. NOTE: This medicine is only for you. Do not share this medicine with others. What if I miss a dose? This does not apply. What may interact with this medicine? Do not take this medicine with any of the following medications: -amprenavir -bosentan -fosamprenavir This medicine may also interact with the following medications: -barbiturate medicines for inducing sleep or treating seizures -certain medicines for fungal infections like ketoconazole and itraconazole -griseofulvin -medicines to treat seizures like carbamazepine,  felbamate, oxcarbazepine, phenytoin, topiramate -modafinil -phenylbutazone -rifampin -some medicines to treat HIV infection like atazanavir, indinavir, lopinavir, nelfinavir, tipranavir, ritonavir -St. John's wort This list may not describe all possible interactions. Give your health care provider a list of all the medicines, herbs, non-prescription drugs, or dietary supplements you use. Also tell them if you smoke, drink alcohol, or use illegal drugs. Some items may interact with your medicine. What should I watch for while using this medicine? This product does not protect you against HIV infection (AIDS) or other sexually transmitted diseases. You should be able to feel the implant by pressing your fingertips over the skin where it was inserted. Contact your doctor if you cannot feel the implant, and use a non-hormonal birth control method (such as condoms) until your doctor confirms that the implant is in place. If you feel that the implant may have broken or become bent while in your arm, contact your healthcare provider. What side effects may I notice from receiving this medicine? Side effects that you should report to your doctor or health care professional as soon as possible: -allergic reactions like skin rash, itching or hives, swelling of the face, lips, or tongue -breast lumps -changes in emotions or moods -depressed mood -heavy or prolonged menstrual bleeding -pain, irritation, swelling, or bruising at the insertion site -scar at site of insertion -signs of infection at the insertion site such as fever, and skin redness, pain or discharge -signs of pregnancy -signs and symptoms of a blood clot such as breathing problems; changes in vision; chest pain; severe, sudden headache; pain, swelling, warmth in the leg; trouble speaking; sudden numbness or weakness of the face, arm or leg -signs and symptoms of liver injury like dark yellow or brown urine; general ill  feeling or flu-like  symptoms; light-colored stools; loss of appetite; nausea; right upper belly pain; unusually weak or tired; yellowing of the eyes or skin -unusual vaginal bleeding, discharge -signs and symptoms of a stroke like changes in vision; confusion; trouble speaking or understanding; severe headaches; sudden numbness or weakness of the face, arm or leg; trouble walking; dizziness; loss of balance or coordination Side effects that usually do not require medical attention (Report these to your doctor or health care professional if they continue or are bothersome.): -acne -back pain -breast pain -changes in weight -dizziness -general ill feeling or flu-like symptoms -headache -irregular menstrual bleeding -nausea -sore throat -vaginal irritation or inflammation This list may not describe all possible side effects. Call your doctor for medical advice about side effects. You may report side effects to FDA at 1-800-FDA-1088. Where should I keep my medicine? This drug is given in a hospital or clinic and will not be stored at home. NOTE: This sheet is a summary. It may not cover all possible information. If you have questions about this medicine, talk to your doctor, pharmacist, or health care provider.    2016, Elsevier/Gold Standard. (2013-11-21 14:07:06)

## 2015-10-13 NOTE — Progress Notes (Signed)
Subjective:     Catherine Villarreal is a 25 y.o. female who presents for a postpartum visit. She is 6 weeks postpartum following a spontaneous vaginal delivery. I have fully reviewed the prenatal and intrapartum course. The delivery was at 40 gestational weeks. Outcome: spontaneous vaginal delivery. Anesthesia: epidural. Postpartum course has been unremarkable. Baby's course has been unremarkable. Baby is feeding by bottle - Similac Advance. Bleeding staining only. Bowel function is normal. Bladder function is normal. Patient is sexually active. Contraception method is condoms. Last IC last night. Did not use condoms. Patient is interested in OCPs & Nexplanon. Postpartum depression screening: negative.  The following portions of the patient's history were reviewed and updated as appropriate: allergies, current medications, past family history, past medical history, past social history, past surgical history and problem list.  Last pap 02/2015 normal.  Hx symptomatic Gall Stones. Wants Nexplanon, but read that it can exacerbate Gall bladder problems. CNM did not find same info. Informed pt, but she still wants to wait until after Cholecystectomy.   Review of Systems Pertinent items are noted in HPI.   Objective:    Breastfeeding? Unknown   General:  alert, cooperative, appears stated age and no distress   Breasts:  declined  Lungs: clear to auscultation bilaterally  Heart:  regular rate and rhythm, S1, S2 normal, no murmur, click, rub or gallop  Abdomen: soft, non-tender; bowel sounds normal; no masses,  no organomegaly   Pelvic:  declined  Rectal Exam: Not performed.        Assessment:     Normal postpartum exam. Pap smear not done at today's visit.   Plan:    1. Contraception: wants OCPs and then Nexplanon after Cholecystectomy 2. Abstain or condoms for next 2 weeks. UTP in 2 weeks. Rx OCPs if neg.

## 2015-10-16 ENCOUNTER — Encounter (HOSPITAL_COMMUNITY): Payer: Self-pay | Admitting: Emergency Medicine

## 2015-10-16 ENCOUNTER — Emergency Department (HOSPITAL_COMMUNITY): Payer: Medicaid Other

## 2015-10-16 ENCOUNTER — Emergency Department (HOSPITAL_COMMUNITY)
Admission: EM | Admit: 2015-10-16 | Discharge: 2015-10-16 | Disposition: A | Discharge status: Home / Self Care | Payer: Medicaid Other | Attending: Emergency Medicine | Admitting: Emergency Medicine

## 2015-10-16 DIAGNOSIS — F17210 Nicotine dependence, cigarettes, uncomplicated: Secondary | ICD-10-CM | POA: Insufficient documentation

## 2015-10-16 DIAGNOSIS — Y939 Activity, unspecified: Secondary | ICD-10-CM | POA: Insufficient documentation

## 2015-10-16 DIAGNOSIS — S99911A Unspecified injury of right ankle, initial encounter: Secondary | ICD-10-CM | POA: Diagnosis present

## 2015-10-16 DIAGNOSIS — S93401A Sprain of unspecified ligament of right ankle, initial encounter: Secondary | ICD-10-CM | POA: Diagnosis not present

## 2015-10-16 DIAGNOSIS — Y999 Unspecified external cause status: Secondary | ICD-10-CM | POA: Insufficient documentation

## 2015-10-16 DIAGNOSIS — Y929 Unspecified place or not applicable: Secondary | ICD-10-CM | POA: Diagnosis not present

## 2015-10-16 DIAGNOSIS — W228XXA Striking against or struck by other objects, initial encounter: Secondary | ICD-10-CM | POA: Diagnosis not present

## 2015-10-16 NOTE — ED Notes (Signed)
Pt returned from xray

## 2015-10-16 NOTE — Discharge Instructions (Signed)
Elevate and apply ice packs on/off to your ankle.  Minimal walking and standing.  Ibuprofen 600 mg 3 times a day with food.  Follow-up with the orthopedic doctor listed in one week if not improving.

## 2015-10-16 NOTE — ED Provider Notes (Signed)
[facility] DEPT Provider Note   CSN: [id number] Arrival date & time: 10/16/15  1309     History   Chief Complaint Chief Complaint  Patient presents with  . Ankle Pain    HPI Zariana Strub is a 25 y.o. female.  HPI   Kathie Posa is a 25 y.o. female who presents to the Emergency Department complaining of right ankle pain and swelling for 3 days.  She reports a twisting injury to her ankle after stepping off a concrete pad.  She reports increased swelling and bruising to her lateral ankle that is worse with weight bearing.  She denies calf pain, numbness and open wounds. She has tried wrapping her ankle and OTC pain relievers without improvement.     Past Medical History:  Diagnosis Date  . Gallstones   . Migraine     There are no active problems to display for this patient.   History reviewed. No pertinent surgical history.  OB History    Gravida Para Term Preterm AB Living   3 1 1     1    SAB TAB Ectopic Multiple Live Births         0 1       Home Medications    Prior to Admission medications   Medication Sig Start Date End Date Taking? Authorizing Provider  butalbital-acetaminophen-caffeine (FIORICET, ESGIC) 50-325-40 MG tablet Take 1 tablet by mouth 2 (two) times daily as needed for headache.    Historical Provider, MD  oxyCODONE-acetaminophen (PERCOCET/ROXICET) 5-325 MG tablet Take 1 tablet by mouth every 4 (four) hours as needed for severe pain.    Historical Provider, MD    Family History History reviewed. No pertinent family history.  Social History Social History  Substance Use Topics  . Smoking status: Current Every Day Smoker    Packs/day: 0.25    Types: Cigarettes  . Smokeless tobacco: Never Used  . Alcohol use No     Allergies   Review of patient's allergies indicates no known allergies.   Review of Systems Review of Systems  Constitutional: Negative for chills and fever.  Gastrointestinal: Negative for nausea and vomiting.    Genitourinary: Negative for difficulty urinating and dysuria.  Musculoskeletal: Positive for arthralgias (right ankle pain and swelling) and joint swelling.  Skin: Negative for wound.  Neurological: Negative for weakness and numbness.  All other systems reviewed and are negative.    Physical Exam Updated Vital Signs BP 110/84 (BP Location: Left Arm)   Pulse (!) 55   Temp 98.5 F (36.9 C) (Oral)   Resp 14   Ht 5\' 7"  (1.702 m)   Wt 62.6 kg   LMP 09/29/2015 (Within Days)   SpO2 99%   Breastfeeding? No   BMI 21.61 kg/m   Physical Exam  Constitutional: She is oriented to person, place, and time. She appears well-developed and well-nourished. No distress.  HENT:  Head: Normocephalic and atraumatic.  Cardiovascular: Normal rate, regular rhythm, normal heart sounds and intact distal pulses.   Pulmonary/Chest: Effort normal and breath sounds normal.  Musculoskeletal: She exhibits edema and tenderness. She exhibits no deformity.  ttp of the lateral right ankle with moderate bruising and mild edema.  DP pulse is brisk,distal sensation intact.  No erythema, abrasion, or bony deformity.  No proximal tenderness. Compartments soft.  Neurological: She is alert and oriented to person, place, and time. She exhibits normal muscle tone. Coordination normal.  Skin: Skin is warm and dry.  Nursing note and vitals reviewed.  ED Treatments / Results  Labs (all labs ordered are listed, but only abnormal results are displayed) Labs Reviewed - No data to display  EKG  EKG Interpretation None       Radiology Dg Ankle Complete Right  Result Date: 10/16/2015 CLINICAL DATA:  Twisted right ankle Thursday night with ankle swelling and pain. EXAM: RIGHT ANKLE - COMPLETE 3+ VIEW COMPARISON:  None. FINDINGS: There is no evidence of fracture, dislocation, or joint effusion. There is no evidence of arthropathy or other focal bone abnormality. Soft tissues are unremarkable. IMPRESSION: No acute  fracture or dislocation. Electronically Signed   By: Sherian ReinWei-Chen  Lin M.D.   On: 10/16/2015 13:40    Procedures Procedures (including critical care time)  Medications Ordered in ED Medications - No data to display   Initial Impression / Assessment and Plan / ED Course  I have reviewed the triage vital signs and the nursing notes.  Pertinent labs & imaging results that were available during my care of the patient were reviewed by me and considered in my medical decision making (see chart for details).  Clinical Course    Pt with ankle sprain.  NV intact. Compartments soft.    ASO applied, pain improved. Agrees to RICE therapy, ibuprofen and ortho follow-up in one week if not improving  Final Clinical Impressions(s) / ED Diagnoses   Final diagnoses:  Ankle sprain, right, initial encounter    New Prescriptions New Prescriptions   No medications on file     Pauline Ausammy Elaynah [place], Cordelia Poche-C 10/16/15 1428    Marily MemosJason Mesner, MD 10/16/15 1432

## 2015-10-16 NOTE — ED Triage Notes (Signed)
Pt comes in to ED w/ c/o R. Sided ankle pain. Per pt "stepped off of concrete pad in backyard" x 3 days ago states she thought she rolled it but Pain and redness have become progressively worse. Confirms numbness/tingling. Pt has tried OTC pain reliever, compression, ice and elevation w/ no relief. Pain 2/10.

## 2015-10-16 NOTE — ED Notes (Signed)
Patient transported to X-ray 

## 2015-10-16 NOTE — ED Notes (Signed)
Swelling and bruising noted to right ankle since she twisted it about three days ago, pt has been taking ibuprofen and tylenol at home.

## 2015-10-26 ENCOUNTER — Ambulatory Visit (INDEPENDENT_AMBULATORY_CARE_PROVIDER_SITE_OTHER): Payer: Medicaid Other | Admitting: General Practice

## 2015-10-26 DIAGNOSIS — Z30011 Encounter for initial prescription of contraceptive pills: Secondary | ICD-10-CM

## 2015-10-26 DIAGNOSIS — Z3202 Encounter for pregnancy test, result negative: Secondary | ICD-10-CM | POA: Diagnosis present

## 2015-10-26 LAB — POCT PREGNANCY, URINE: PREG TEST UR: NEGATIVE

## 2015-10-26 MED ORDER — NORGESTIMATE-ETH ESTRADIOL 0.25-35 MG-MCG PO TABS: 1.0000 | ORAL_TABLET | Freq: Every day | ORAL | 11 refills | Status: DC

## 2015-10-26 NOTE — Progress Notes (Signed)
Patient here for upt to obtain OCPs. UPT -. Will send OCPs to pharmacy per [place]'s note. Instructed patient to take the medication same time every day and to use back up protection for 2 weeks following. Patient verbalized understanding.

## 2015-11-24 ENCOUNTER — Telehealth: Payer: Self-pay | Admitting: General Practice

## 2015-11-24 NOTE — Telephone Encounter (Signed)
Patient called and left message requesting call back in regards to a referral to the surgical center & she also has a question about her birth control.

## 2015-12-02 NOTE — Telephone Encounter (Signed)
Called patient, no answer- left message to call us back at the office if you still need assistance.

## 2015-12-20 ENCOUNTER — Telehealth: Payer: Self-pay | Admitting: General Practice

## 2015-12-20 DIAGNOSIS — Z30011 Encounter for initial prescription of contraceptive pills: Secondary | ICD-10-CM

## 2015-12-20 MED ORDER — NORGESTIMATE-ETH ESTRADIOL 0.25-35 MG-MCG PO TABS: 1.0000 | ORAL_TABLET | Freq: Every day | ORAL | 11 refills | Status: DC

## 2015-12-20 NOTE — Telephone Encounter (Signed)
Patient called and left message stating she needs another provider to write her prescription because the one that did is not covered by medicaid. Patient also wants to check on the status of her referral to [facility]. Per chart review, no record of confirmed gallstones- just self report from patient. Reordered med per Judeth HornErin Lawrence. Also discussed referral. Per Denny PeonErin, patient needs primary care and then referral to gen surgeon if appropriate. Called patient, no answer- left message stating we are trying to reach you to return your phone call. We did receive your message and sent the prescription in. If you have other questions you may call us back

## 2016-01-10 ENCOUNTER — Telehealth: Payer: Self-pay | Admitting: *Deleted

## 2016-01-10 DIAGNOSIS — K8020 Calculus of gallbladder without cholecystitis without obstruction: Secondary | ICD-10-CM

## 2016-01-10 NOTE — Telephone Encounter (Signed)
I called Huntley DecSara and she states all the issues she had called about previously have been addressed except still needs referral to [facility]. States she called them and they had not gotten the referral . She states [place], CNM ordered it- veriied this per chart review of Virginia's notes. I informed her I will put in referral order and follow [facility].   I placed the order and called [facility]. They requested demographics be sent.

## 2016-01-10 NOTE — Telephone Encounter (Signed)
Huntley DecSara left a message 12/07/15 am - message was unclear due to poor reception.

## 2016-01-17 ENCOUNTER — Encounter (HOSPITAL_COMMUNITY): Payer: Self-pay | Admitting: Emergency Medicine

## 2016-01-17 ENCOUNTER — Emergency Department (HOSPITAL_COMMUNITY)
Admission: EM | Admit: 2016-01-17 | Discharge: 2016-01-17 | Disposition: A | Discharge status: Home / Self Care | Payer: Medicaid Other | Attending: Emergency Medicine | Admitting: Emergency Medicine

## 2016-01-17 ENCOUNTER — Emergency Department (HOSPITAL_COMMUNITY): Payer: Medicaid Other

## 2016-01-17 DIAGNOSIS — F17210 Nicotine dependence, cigarettes, uncomplicated: Secondary | ICD-10-CM | POA: Diagnosis not present

## 2016-01-17 DIAGNOSIS — Z79899 Other long term (current) drug therapy: Secondary | ICD-10-CM | POA: Diagnosis not present

## 2016-01-17 DIAGNOSIS — R1013 Epigastric pain: Secondary | ICD-10-CM | POA: Insufficient documentation

## 2016-01-17 DIAGNOSIS — R110 Nausea: Secondary | ICD-10-CM | POA: Diagnosis not present

## 2016-01-17 LAB — CBC
HEMATOCRIT: 41.7 % (ref 36.0–46.0)
Hemoglobin: 14.5 g/dL (ref 12.0–15.0)
MCH: 30.8 pg (ref 26.0–34.0)
MCHC: 34.8 g/dL (ref 30.0–36.0)
MCV: 88.5 fL (ref 78.0–100.0)
Platelets: 316 10*3/uL (ref 150–400)
RBC: 4.71 MIL/uL (ref 3.87–5.11)
RDW: 13.1 % (ref 11.5–15.5)
WBC: 21.7 10*3/uL — AB (ref 4.0–10.5)

## 2016-01-17 LAB — COMPREHENSIVE METABOLIC PANEL
ALBUMIN: 4.4 g/dL (ref 3.5–5.0)
ALT: 18 U/L (ref 14–54)
AST: 55 U/L — AB (ref 15–41)
Alkaline Phosphatase: 75 U/L (ref 38–126)
Anion gap: 9 (ref 5–15)
BUN: 16 mg/dL (ref 6–20)
CHLORIDE: 102 mmol/L (ref 101–111)
CO2: 26 mmol/L (ref 22–32)
CREATININE: 0.79 mg/dL (ref 0.44–1.00)
Calcium: 9.5 mg/dL (ref 8.9–10.3)
GFR calc Af Amer: >60 mL/min (ref >60)
Glucose, Bld: 135 mg/dL — ABNORMAL HIGH (ref 65–99)
POTASSIUM: 3.3 mmol/L — AB (ref 3.5–5.1)
SODIUM: 137 mmol/L (ref 135–145)
Total Bilirubin: 1 mg/dL (ref 0.3–1.2)
Total Protein: 7.9 g/dL (ref 6.5–8.1)

## 2016-01-17 LAB — URINALYSIS, ROUTINE W REFLEX MICROSCOPIC
Bilirubin Urine: NEGATIVE
GLUCOSE, UA: NEGATIVE mg/dL
HGB URINE DIPSTICK: NEGATIVE
LEUKOCYTES UA: NEGATIVE
Nitrite: NEGATIVE
PH: 6.5 (ref 5.0–8.0)
Protein, ur: NEGATIVE mg/dL
Specific Gravity, Urine: 1.01 (ref 1.005–1.030)

## 2016-01-17 LAB — LIPASE, BLOOD: LIPASE: 59 U/L — AB (ref 11–51)

## 2016-01-17 LAB — PREGNANCY, URINE: Preg Test, Ur: NEGATIVE

## 2016-01-17 MED ORDER — ONDANSETRON HCL 4 MG/2ML IJ SOLN
4.0000 mg | Freq: Once | INTRAMUSCULAR | Status: AC
  Administered 2016-01-17: 4 mg via INTRAVENOUS
  Filled 2016-01-17: qty 2

## 2016-01-17 MED ORDER — HYDROCODONE-ACETAMINOPHEN 5-325 MG PO TABS: 1.0000 | ORAL_TABLET | Freq: Four times a day (QID) | ORAL | 0 refills | Status: DC | PRN

## 2016-01-17 MED ORDER — SODIUM CHLORIDE 0.9 % IV SOLN
INTRAVENOUS | Status: DC
  Administered 2016-01-17: 19:00:00 via INTRAVENOUS

## 2016-01-17 MED ORDER — SODIUM CHLORIDE 0.9 % IV BOLUS (SEPSIS)
500.0000 mL | Freq: Once | INTRAVENOUS | Status: AC
  Administered 2016-01-17: 500 mL via INTRAVENOUS

## 2016-01-17 MED ORDER — IOPAMIDOL (ISOVUE-300) INJECTION 61%
100.0000 mL | Freq: Once | INTRAVENOUS | Status: AC | PRN
  Administered 2016-01-17: 100 mL via INTRAVENOUS

## 2016-01-17 MED ORDER — IOPAMIDOL (ISOVUE-370) INJECTION 76%
INTRAVENOUS | Status: AC
  Filled 2016-01-17: qty 50

## 2016-01-17 MED ORDER — HYDROMORPHONE HCL 1 MG/ML IJ SOLN
1.0000 mg | Freq: Once | INTRAMUSCULAR | Status: AC
  Administered 2016-01-17: 1 mg via INTRAVENOUS
  Filled 2016-01-17: qty 1

## 2016-01-17 MED ORDER — IOPAMIDOL (ISOVUE-300) INJECTION 61%
INTRAVENOUS | Status: AC
  Administered 2016-01-17: 30 mL via ORAL
  Filled 2016-01-17: qty 30

## 2016-01-17 MED ORDER — PROMETHAZINE HCL 25 MG PO TABS: 25.0000 mg | ORAL_TABLET | Freq: Four times a day (QID) | ORAL | 1 refills | Status: DC | PRN

## 2016-01-17 NOTE — ED Provider Notes (Signed)
[facility] Provider Note   CSN: [id number] Arrival date & time: 01/17/16  1510     History   Chief Complaint Chief Complaint  Patient presents with  . Abdominal Pain    HPI Catherine Villarreal is a 25 y.o. female.  Patient with 2 year history of gallbladder problems and history of gallstones. Patient has follow-up with [facility]. Patient with recent pregnancy and delivery so further evaluation for definitive treatment for the gallstones was delayed. Today at 2:00 in the afternoon acute onset of epigastric abdominal pain more right-sided than left. Pain was 7 out of 10. Associated with nausea but no vomiting no diarrhea no fevers. Reminds patient of her past attacks related to the gallbladder.      Past Medical History:  Diagnosis Date  . Gallstones   . Migraine     There are no active problems to display for this patient.   History reviewed. No pertinent surgical history.  OB History    Gravida Para Term Preterm AB Living   3 1 1     1    SAB TAB Ectopic Multiple Live Births         0 1       Home Medications    Prior to Admission medications   Medication Sig Start Date End Date Taking? Authorizing Provider  acetaminophen (TYLENOL) 500 MG tablet Take 500 mg by mouth every 6 (six) hours as needed for mild pain or moderate pain.   Yes Historical Provider, MD  butalbital-acetaminophen-caffeine (FIORICET, ESGIC) 50-325-40 MG tablet Take 1 tablet by mouth 2 (two) times daily as needed for headache.   Yes Historical Provider, MD  norgestimate-ethinyl estradiol (ORTHO-CYCLEN,SPRINTEC,PREVIFEM) 0.25-35 MG-MCG tablet Take 1 tablet by mouth daily. 12/20/15  Yes Judeth HornErin Lawrence, NP  HYDROcodone-acetaminophen (NORCO/VICODIN) 5-325 MG tablet Take 1-2 tablets by mouth every 6 (six) hours as needed. 01/17/16   Vanetta MuldersScott Tanay Massiah, MD  promethazine (PHENERGAN) 25 MG tablet Take 1 tablet (25 mg total) by mouth every 6 (six) hours as needed. 01/17/16   Vanetta MuldersScott Edward Trevino, MD      Family History Family History  Problem Relation Age of Onset  . Cancer Mother     Social History Social History  Substance Use Topics  . Smoking status: Current Every Day Smoker    Packs/day: 0.25    Types: Cigarettes  . Smokeless tobacco: Never Used  . Alcohol use Yes     Comment: occas     Allergies   Patient has no known allergies.   Review of Systems Review of Systems  Constitutional: Negative for fever.  HENT: Negative for congestion.   Eyes: Negative for redness.  Respiratory: Negative for shortness of breath.   Cardiovascular: Negative for chest pain.  Gastrointestinal: Positive for abdominal pain and nausea. Negative for diarrhea and vomiting.  Genitourinary: Negative for dysuria.  Musculoskeletal: Negative for back pain.  Skin: Negative for rash.  Neurological: Negative for headaches.  Hematological: Does not bruise/bleed easily.  Psychiatric/Behavioral: Negative for confusion.     Physical Exam Updated Vital Signs BP 109/60 (BP Location: Left Arm)   Pulse 63   Temp 97.7 F (36.5 C) (Oral)   Resp 17   Ht 5\' 7"  (1.702 m)   Wt 62.6 kg   LMP 01/10/2016   SpO2 100%   BMI 21.61 kg/m   Physical Exam  Constitutional: She is oriented to person, place, and time. She appears well-developed and well-nourished. No distress.  HENT:  Head: Normocephalic and atraumatic.  Mouth/Throat:  Oropharynx is clear and moist.  Eyes: EOM are normal. Pupils are equal, round, and reactive to light.  Neck: Normal range of motion. Neck supple.  Cardiovascular: Normal rate, regular rhythm and normal heart sounds.   Pulmonary/Chest: Effort normal and breath sounds normal. No respiratory distress.  Abdominal: Soft. Bowel sounds are normal. There is tenderness. There is no guarding.  Tenderness epigastric right upper quadrant  Musculoskeletal: Normal range of motion. She exhibits no edema.  Neurological: She is alert and oriented to person, place, and time. No cranial  nerve deficit or sensory deficit. She exhibits normal muscle tone. Coordination normal.  Skin: Skin is warm. No rash noted.     ED Treatments / Results  Labs (all labs ordered are listed, but only abnormal results are displayed) Labs Reviewed  LIPASE, BLOOD - Abnormal; Notable for the following:       Result Value   Lipase 59 (*)    All other components within normal limits  COMPREHENSIVE METABOLIC PANEL - Abnormal; Notable for the following:    Potassium 3.3 (*)    Glucose, Bld 135 (*)    AST 55 (*)    All other components within normal limits  CBC - Abnormal; Notable for the following:    WBC 21.7 (*)    All other components within normal limits  URINALYSIS, ROUTINE W REFLEX MICROSCOPIC (NOT AT [facility]) - Abnormal; Notable for the following:    Ketones, ur TRACE (*)    All other components within normal limits  PREGNANCY, URINE    EKG  EKG Interpretation None       Radiology Ct Abdomen Pelvis W Contrast  Result Date: 01/17/2016 CLINICAL DATA:  Right upper quadrant pain. History of cholelithiasis. EXAM: CT ABDOMEN AND PELVIS WITH CONTRAST TECHNIQUE: Multidetector CT imaging of the abdomen and pelvis was performed using the standard protocol following bolus administration of intravenous contrast. CONTRAST:  30 mL Isovue-300 IV COMPARISON:  None. FINDINGS: Lower chest: No pulmonary nodules. No visible pleural or pericardial effusion. Hepatobiliary: Normal hepatic size and contours without focal liver lesion. No perihepatic ascites. No intra- or extrahepatic biliary dilatation. Normal gallbladder. Pancreas: Normal pancreatic contours and enhancement. No peripancreatic fluid collection or pancreatic ductal dilatation. Spleen: Normal. Adrenals/Urinary Tract: Normal adrenal glands. No hydronephrosis or solid renal mass. Stomach/Bowel: No abnormal bowel dilatation. No bowel wall thickening or adjacent fat stranding to indicate acute inflammation. No abdominal fluid collection. Normal  appendix. Vascular/Lymphatic: Normal course and caliber of the major abdominal vessels. No abdominal or pelvic adenopathy. Reproductive: Normal uterus and ovaries. Musculoskeletal: No lytic or blastic osseous lesion. Normal visualized extrathoracic and extraperitoneal soft tissues. Other: No contributory non-categorized findings. IMPRESSION: No acute abnormality of the abdomen or pelvis. No radiopaque gallstones or other evidence of acute cholecystitis. Electronically Signed   By: Deatra Robinson M.D.   On: 01/17/2016 22:14    Procedures Procedures (including critical care time)  Medications Ordered in ED Medications  0.9 %  sodium chloride infusion ( Intravenous New Bag/Given 01/17/16 1832)  sodium chloride 0.9 % bolus 500 mL (0 mLs Intravenous Stopped 01/17/16 2010)  ondansetron (ZOFRAN) injection 4 mg (4 mg Intravenous Given 01/17/16 1834)  HYDROmorphone (DILAUDID) injection 1 mg (1 mg Intravenous Given 01/17/16 1834)  iopamidol (ISOVUE-300) 61 % injection (30 mLs Oral Contrast Given 01/17/16 2015)  ondansetron (ZOFRAN) injection 4 mg (4 mg Intravenous Given 01/17/16 2026)  iopamidol (ISOVUE-300) 61 % injection 100 mL (100 mLs Intravenous Contrast Given 01/17/16 2143)     Initial Impression /  Assessment and Plan / ED Course  I have reviewed the triage vital signs and the nursing notes.  Pertinent labs & imaging results that were available during my care of the patient were reviewed by me and considered in my medical decision making (see chart for details).  Clinical Course     Patient reportedly with history of gallstones for 2 years. Recent pregnancy so nothing was done during the pregnancy. Patient now has follow-up arranged with [facility]. Patient with acute onset of epigastric abdominal pain. Occurred at 2:00 this afternoon. Associated with nausea but no vomiting.  Workup lab-wise liver function tests without any significant abnormalities. Marked leukocytosis and mild  elevation in the lipase suggestive maybe of pancreatitis. Based on this CT of the abdomen was done. Without any acute findings. Patient is now nontender in the epigastric and right upper quadrant area. Symptoms could be consistent with mild pancreatitis. Recommend clear liquid diet. Keep her appointment with [facility]. We'll treat her with the pain medication and Phenergan for nausea and vomiting. Patient will return for any new or worse symptoms. Patient overall feels much better.  Final Clinical Impressions(s) / ED Diagnoses   Final diagnoses:  Epigastric pain    New Prescriptions New Prescriptions   HYDROCODONE-ACETAMINOPHEN (NORCO/VICODIN) 5-325 MG TABLET    Take 1-2 tablets by mouth every 6 (six) hours as needed.   PROMETHAZINE (PHENERGAN) 25 MG TABLET    Take 1 tablet (25 mg total) by mouth every 6 (six) hours as needed.     Vanetta MuldersScott Savilla Turbyfill, MD 01/17/16 2234

## 2016-01-17 NOTE — Discharge Instructions (Signed)
Keep your appointment with [facility]. Today's CT did not show any significant problems around the gallbladder. Your lipase which is excreted by the pancreas was slightly elevated. This may be some mild pancreatitis. Recommend clear liquid diet for 24 hours. Take the pain medicine as needed take the Phenergan as needed for nausea and vomiting. Return for any new or worse symptoms which include worse pain or fever or persistent vomiting. Suspected [facility] will one another ultrasound.

## 2016-01-17 NOTE — ED Triage Notes (Signed)
Pt reports hx of gallstones, was unable to have sx due to recent childbirth. Pt states she started having upper abdominal and RUQ pain approx 1 hour ago.

## 2016-01-24 ENCOUNTER — Ambulatory Visit: Payer: Self-pay | Admitting: Surgery

## 2016-01-24 NOTE — H&P (Signed)
Catherine Villarreal 01/24/2016 8:46 AM Location: [facility] Patient #: [id number] DOB: 30-Jan-1991 Separated / Language: [place] / Race: White Female  History of Present Illness Catherine Sportsman MD; 01/24/2016 10:53 AM) The patient is a 25 year old female who presents for evaluation of gall stones. Note for "Gall stones": Patient sent for surgical consultation at the request of [place], CNM. Abdominal pain and history of gallstones.  Pleasant young female. Has had intermittent episodes of epigastric pain most intensely on right side and right back with nausea and bloating. Not gunmetal point of emesis. Years ago when she lived in [place] outside of [place]. Is getting an ultrasound that showed gallstones. Told that because she did not have emesis, surgery was not needed. Recently she had worsening attacks with the most recent pregnancy. Had a vaginal delivery month ago. She smokes about half pack a day. Usually moves her bowels about every other day. She's never had any abdominal surgery. With the attacks, over-the-counter medication sometimes are not enough. Not consistent with heartburn or reflux. Is not having any food allergies. She's had to go to the emergency room a few times requiring chronic pain control. Worse attack was always with heavy greasy foods. She can go months without attack. Worse wound was after having a couple bites of a [place] cheese steak.  No personal nor family history of GI/colon cancer, inflammatory bowel disease, irritable bowel syndrome, allergy such as Celiac Sprue, dietary/dairy problems, colitis, ulcers nor gastritis. No recent sick contacts/gastroenteritis. No travel outside the country. No changes in diet. No dysphagia to solids or liquids. No significant heartburn or reflux. No hematochezia, hematemesis, coffee ground emesis. No evidence of prior gastric/peptic ulceration.   Other Problems Catherine Clack, RN, BSN; 01/24/2016  8:46 AM) Back Pain Chest pain Cholelithiasis Gastroesophageal Reflux Disease Hemorrhoids Migraine Headache  Past Surgical History Catherine Clack, RN, BSN; 01/24/2016 8:46 AM) Oral Surgery  Diagnostic Studies History (Catherine Clack, RN, BSN; 01/24/2016 8:46 AM) Colonoscopy never Mammogram never Pap Smear 1-5 years ago  Allergies Catherine Clack, RN, BSN; 01/24/2016 8:47 AM) No Known Drug Allergies 01/24/2016  Medication History (Catherine Clack, RN, BSN; 01/24/2016 8:48 AM) Hydrocodone-Acetaminophen (5-325MG  Tablet, Oral as needed) Active. Sprintec 28 (0.25-35MG -MCG Tablet, Oral daily) Active. Tylenol (325MG  Tablet, Oral as needed) Active. Medications Reconciled  Social History (Catherine Clack, RN, BSN; 01/24/2016 8:46 AM) Alcohol use Occasional alcohol use. Caffeine use Carbonated beverages, Coffee. No drug use Tobacco use Current every day smoker.  Family History (Catherine Clack, RN, BSN; 01/24/2016 8:46 AM) Alcohol Abuse Family Members In General. Arthritis Family Members In General. Breast Cancer Mother. Cerebrovascular Accident Father. Colon Cancer Family Members In General. Diabetes Mellitus Family Members In General. Heart Disease Family Members In General. Hypertension Family Members In General, Mother. Migraine Headache Mother. Thyroid problems Family Members In General.  Pregnancy / Birth History (Catherine Clack, RN, BSN; 01/24/2016 8:46 AM) Age at menarche 12 years. Contraceptive History Oral contraceptives. Gravida 2 Irregular periods Maternal age 36-20 Para 1     Review of Systems Glass blower/designer, BSN; 01/24/2016 8:46 AM) General Not Present- Appetite Loss, Chills, Fatigue, Fever, Night Sweats, Weight Gain and Weight Loss. Skin Present- Dryness. Not Present- Change in Wart/Mole, Hives, Jaundice, New Lesions, Non-Healing Wounds, Rash and Ulcer. HEENT Not Present- Earache, Hearing Loss, Hoarseness, Nose Bleed,  Oral Ulcers, Ringing in the Ears, Seasonal Allergies, Sinus Pain, Sore Throat, Visual Disturbances, Wears glasses/contact lenses and Yellow Eyes. Respiratory Not Present- Bloody sputum, Chronic Cough, Difficulty Breathing, Snoring and Wheezing.  Breast Not Present- Breast Mass, Breast Pain, Nipple Discharge and Skin Changes. Cardiovascular Not Present- Chest Pain, Difficulty Breathing Lying Down, Leg Cramps, Palpitations, Rapid Heart Rate, Shortness of Breath and Swelling of Extremities. Gastrointestinal Present- Abdominal Pain, Bloating, Hemorrhoids and Indigestion. Not Present- Bloody Stool, Change in Bowel Habits, Chronic diarrhea, Constipation, Difficulty Swallowing, Excessive gas, Gets full quickly at meals, Nausea, Rectal Pain and Vomiting. Female Genitourinary Not Present- Frequency, Nocturia, Painful Urination, Pelvic Pain and Urgency. Musculoskeletal Present- Back Pain. Not Present- Joint Pain, Joint Stiffness, Muscle Pain, Muscle Weakness and Swelling of Extremities. Neurological Present- Headaches. Not Present- Decreased Memory, Fainting, Numbness, Seizures, Tingling, Tremor, Trouble walking and Weakness. Psychiatric Not Present- Anxiety, Bipolar, Change in Sleep Pattern, Depression, Fearful and Frequent crying. Endocrine Not Present- Cold Intolerance, Excessive Hunger, Hair Changes, Heat Intolerance, Hot flashes and New Diabetes. Hematology Not Present- Blood Thinners, Easy Bruising, Excessive bleeding, Gland problems, HIV and Persistent Infections.  Vitals Glass blower/designer, BSN; 01/24/2016 8:47 AM) 01/24/2016 8:46 AM Weight: 138 lb Height: 67in Body Surface Area: 1.73 m Body Mass Index: 21.61 kg/m  Temp.: 98.76F(Oral)  Pulse: 76 (Regular)  BP: 100/64 (Sitting, Left Arm, Standard)      Physical Exam Catherine Sportsman MD; 01/24/2016 10:53 AM)  General Mental Status-Alert. General Appearance-Not in acute distress, Not Sickly. Orientation-Oriented  X3. Hydration-Well hydrated. Voice-Normal.  Integumentary Global Assessment Upon inspection and palpation of skin surfaces of the - Axillae: non-tender, no inflammation or ulceration, no drainage. and Distribution of scalp and body hair is normal. General Characteristics Temperature - normal warmth is noted.  Head and Neck Head-normocephalic, atraumatic with no lesions or palpable masses. Face Global Assessment - atraumatic, no absence of expression. Neck Global Assessment - no abnormal movements, no bruit auscultated on the right, no bruit auscultated on the left, no decreased range of motion, non-tender. Trachea-midline. Thyroid Gland Characteristics - non-tender.  Eye Eyeball - Left-Extraocular movements intact, No Nystagmus. Eyeball - Right-Extraocular movements intact, No Nystagmus. Cornea - Left-No Hazy. Cornea - Right-No Hazy. Sclera/Conjunctiva - Left-No scleral icterus, No Discharge. Sclera/Conjunctiva - Right-No scleral icterus, No Discharge. Pupil - Left-Direct reaction to light normal. Pupil - Right-Direct reaction to light normal.  ENMT Ears Pinna - Left - no drainage observed, no generalized tenderness observed. Right - no drainage observed, no generalized tenderness observed. Nose and Sinuses External Inspection of the Nose - no destructive lesion observed. Inspection of the nares - Left - quiet respiration. Right - quiet respiration. Mouth and Throat Lips - Upper Lip - no fissures observed, no pallor noted. Lower Lip - no fissures observed, no pallor noted. Nasopharynx - no discharge present. Oral Cavity/Oropharynx - Tongue - no dryness observed. Oral Mucosa - no cyanosis observed. Hypopharynx - no evidence of airway distress observed.  Chest and Lung Exam Inspection Movements - Normal and Symmetrical. Accessory muscles - No use of accessory muscles in breathing. Palpation Palpation of the chest reveals -  Non-tender. Auscultation Breath sounds - Normal and Clear.  Cardiovascular Auscultation Rhythm - Regular. Murmurs & Other Heart Sounds - Auscultation of the heart reveals - No Murmurs and No Systolic Clicks.  Abdomen Inspection Inspection of the abdomen reveals - No Visible peristalsis and No Abnormal pulsations. Umbilicus - No Bleeding, No Urine drainage. Palpation/Percussion Palpation and Percussion of the abdomen reveal - Soft, Non Tender, No Rebound tenderness, No Rigidity (guarding) and No Cutaneous hyperesthesia. Note: Mild epigastric and right upper quadrant discomfort but no Murphy sign. No diastases. Abdomen soft. Otherwise nontender, nondistended. No guarding. No  umbilical no other hernias  Female Genitourinary Sexual Maturity Tanner 5 - Adult hair pattern. Note: No vaginal bleeding nor discharge  Peripheral Vascular Upper Extremity Inspection - Left - No Cyanotic nailbeds, Not Ischemic. Right - No Cyanotic nailbeds, Not Ischemic.  Neurologic Neurologic evaluation reveals -normal attention span and ability to concentrate, able to name objects and repeat phrases. Appropriate fund of knowledge , normal sensation and normal coordination. Mental Status Affect - not angry, not paranoid. Cranial Nerves-Normal Bilaterally. Gait-Normal.  Neuropsychiatric Mental status exam performed with findings of-able to articulate well with normal speech/language, rate, volume and coherence, thought content normal with ability to perform basic computations and apply abstract reasoning and no evidence of hallucinations, delusions, obsessions or homicidal/suicidal ideation.  Musculoskeletal Global Assessment Spine, Ribs and Pelvis - no instability, subluxation or laxity. Right Upper Extremity - no instability, subluxation or laxity.  Lymphatic Head & Neck  General Head & Neck Lymphatics: Bilateral - Description - No Localized lymphadenopathy. Axillary  General Axillary  Region: Bilateral - Description - No Localized lymphadenopathy. Femoral & Inguinal  Generalized Femoral & Inguinal Lymphatics: Left - Description - No Localized lymphadenopathy. Right - Description - No Localized lymphadenopathy.    Assessment & Plan Catherine Sportsman MD; 01/24/2016 10:53 AM)  CHRONIC CHOLECYSTITIS WITH CALCULUS (K80.10) Impression: Classic story biliary colic with rest of the differential diagnosis seems unlikely.  I would like to make sure that she can bring in the ultrasound report. She thinks it was done just outside of [place], [place] ([place] when she lived up there. She thinks she has records at home. If that confirms gallstones, proceed with elective cholecystectomy to break the cycle attacks. If she cannot find a reporter is negative, consider ultrasound and a HIDA scan to document objectively an abnormal gallbladder.  Quit smoking  Current Plans You are being scheduled for surgery - Our schedulers will call you.  You should hear from our office's scheduling department within 5 working days about the location, date, and time of surgery. We try to make accommodations for patient's preferences in scheduling surgery, but sometimes the OR schedule or the surgeon's schedule prevents [place] from making those accommodations.  If you have not heard from our office [phone]) in 5 working days, call the office and ask for your surgeon's nurse.  If you have other questions about your diagnosis, plan, or surgery, call the office and ask for your surgeon's nurse.  The anatomy & physiology of hepatobiliary & pancreatic function was discussed. The pathophysiology of gallbladder dysfunction was discussed. Natural history risks without surgery was discussed. I feel the risks of no intervention will lead to serious problems that outweigh the operative risks; therefore, I recommended cholecystectomy to remove the pathology. I explained laparoscopic techniques with possible  need for an open approach. Probable cholangiogram to evaluate the bilary tract was explained as well.  Risks such as bleeding, infection, abscess, leak, injury to other organs, need for further treatment, heart attack, death, and other risks were discussed. I noted a good likelihood this will help address the problem. Possibility that this will not correct all abdominal symptoms was explained. Goals of post-operative recovery were discussed as well. We will work to minimize complications. An educational handout further explaining the pathology and treatment options was given as well. Questions were answered. The patient expresses understanding & wishes to proceed with surgery.  Pt Education - Pamphlet Given - Laparoscopic Gallbladder Surgery: discussed with patient and provided information. Written instructions provided Pt Education - [facility] Laparosopic Post  Op HCI (Dewaun Kinzler) Pt Education - [facility] Good Bowel Health (Ellean Firman) Pt Education - Laparoscopic Cholecystectomy: gallbladder TOBACCO ABUSE (Z72.0)  Current Plans Pt Education - [facility] STOP SMOKING!  Catherine Villarreal, M.D., F.A.C.S. Gastrointestinal and Minimally Invasive Surgery [facility] [address], [address] [place], [place] [address] [phone] Main / Paging

## 2016-02-21 HISTORY — PX: CHOLECYSTECTOMY: SHX55

## 2016-05-03 ENCOUNTER — Ambulatory Visit: Payer: Medicaid Other | Admitting: Advanced Practice Midwife

## 2016-05-31 ENCOUNTER — Encounter: Payer: Self-pay | Admitting: Obstetrics & Gynecology

## 2016-05-31 ENCOUNTER — Ambulatory Visit (INDEPENDENT_AMBULATORY_CARE_PROVIDER_SITE_OTHER): Payer: Medicaid Other | Admitting: Obstetrics & Gynecology

## 2016-05-31 VITALS — BP 126/73 | HR 81 | Wt 140.0 lb

## 2016-05-31 DIAGNOSIS — Z3202 Encounter for pregnancy test, result negative: Secondary | ICD-10-CM | POA: Diagnosis not present

## 2016-05-31 DIAGNOSIS — Z30013 Encounter for initial prescription of injectable contraceptive: Secondary | ICD-10-CM

## 2016-05-31 LAB — POCT PREGNANCY, URINE: PREG TEST UR: NEGATIVE

## 2016-05-31 NOTE — Progress Notes (Signed)
   Subjective:    Patient ID: Catherine Villarreal, female    DOB: February 13, 1991, 26 y.o.   MRN: [id number]  HPI 26 yo SW P69 (56 month old son) here today to discuss contraception. She has used OCPs in the past, started 10/17 and stopped 2/18 due to irregular bleeding. She is bottle feeding. She is interested in Nexplanon. She is not using any contraception now and has unprotected IC in the last 2 weeks.   Review of Systems She reports a normal pap smear in [place] within the last year.    Objective:   Physical Exam WNWHWFNAD Breathing, conversing, and ambulating normally Abd- benign       Assessment & Plan:  Contraception- discussed options. She has been made aware that irregular bleeding, even daily for weeks or months with Nexplanon is common and not a reason to remove it. She declines the IUD, feels that it is risky. She is considering the depo provera shot. RTC prn

## 2016-06-16 ENCOUNTER — Telehealth: Payer: Self-pay | Admitting: *Deleted

## 2016-06-16 ENCOUNTER — Ambulatory Visit: Payer: Medicaid Other | Admitting: Obstetrics & Gynecology

## 2016-06-16 NOTE — Telephone Encounter (Signed)
Pt husband came to office door at 11:30 He stated that his wife had an appointment today at 11:30. Advised him that the appt was at 10:40 and all the md's are gone. He said that he has a paper at home that proves the appt is at 11:40. I gave my apologies and offered to call them back to reschedule, he stated no thank you and left.

## 2016-07-27 ENCOUNTER — Ambulatory Visit (INDEPENDENT_AMBULATORY_CARE_PROVIDER_SITE_OTHER): Payer: Medicaid Other | Admitting: Obstetrics & Gynecology

## 2016-07-27 ENCOUNTER — Encounter: Payer: Self-pay | Admitting: Obstetrics & Gynecology

## 2016-07-27 VITALS — BP 112/76 | HR 67 | Wt 145.5 lb

## 2016-07-27 DIAGNOSIS — Z3049 Encounter for surveillance of other contraceptives: Secondary | ICD-10-CM

## 2016-07-27 DIAGNOSIS — Z3202 Encounter for pregnancy test, result negative: Secondary | ICD-10-CM

## 2016-07-27 DIAGNOSIS — Z30017 Encounter for initial prescription of implantable subdermal contraceptive: Secondary | ICD-10-CM

## 2016-07-27 LAB — POCT PREGNANCY, URINE: Preg Test, Ur: NEGATIVE

## 2016-07-27 MED ORDER — ETONOGESTREL 68 MG ~~LOC~~ IMPL
68.0000 mg | DRUG_IMPLANT | Freq: Once | SUBCUTANEOUS | Status: AC
  Administered 2016-07-27: 68 mg via SUBCUTANEOUS

## 2016-07-27 NOTE — Progress Notes (Signed)
   Subjective:    Patient ID: Catherine Villarreal, female    DOB: April 08, 1990, 26 y.o.   MRN: [id number]  HPI 26 yo SW G0 here for Nexplanon insertion. She has an 7811 month old at home along with her fiance's 3 son's, all under the age of 108. She is aware that irregular bleeding is a side effect and NOT a reason for removal.  Review of Systems     Objective:   Physical Exam WNWHWFNAD Breathing, conversing, and ambulating normally UPT was negative. Consent was signed. Time out procedure was done. Her left arm was prepped with betadine and infiltrated with 3 cc of 1% lidocaine. After adequate anesthesia was assured, the Nexplanon device was placed according to standard of care. Her arm was hemostatic and was bandaged. She tolerated the procedure well.        Assessment & Plan:  Contraception- Nexplanon Rec back up for 2 weeks RTC 3 months for follow up and annual exam

## 2016-07-27 NOTE — Addendum Note (Signed)
Addended by: Cheree DittoGRAHAM, Cassandra Mcmanaman on: 07/27/2016 09:30 AM   Modules accepted: Orders

## 2016-08-21 ENCOUNTER — Telehealth: Payer: Self-pay | Admitting: Lab

## 2016-08-21 NOTE — Telephone Encounter (Signed)
Patient called today regarding her Nexplanon that she had inserted 07/27/2016 by Dr. Marice Potterove. Patient states she has been having the side affects that she read on the internet  chest pain, headaches,loss appetite,stomach pains,nausea diarrhea, mood swings. I advised the patient to immediately go to the Emergency room if the chest pain worsen. Also I told her I will have one of our secretaries call her to schedule an a appointment to get her in here as soon as possible with Dr.Dove.

## 2016-08-30 ENCOUNTER — Encounter: Payer: Self-pay | Admitting: Obstetrics and Gynecology

## 2016-08-30 ENCOUNTER — Ambulatory Visit (INDEPENDENT_AMBULATORY_CARE_PROVIDER_SITE_OTHER): Payer: Medicaid Other | Admitting: Obstetrics and Gynecology

## 2016-08-30 VITALS — BP 122/69 | HR 83 | Wt 135.0 lb

## 2016-08-30 DIAGNOSIS — Z3049 Encounter for surveillance of other contraceptives: Secondary | ICD-10-CM | POA: Diagnosis present

## 2016-08-30 DIAGNOSIS — Z30011 Encounter for initial prescription of contraceptive pills: Secondary | ICD-10-CM | POA: Diagnosis not present

## 2016-08-30 DIAGNOSIS — Z3046 Encounter for surveillance of implantable subdermal contraceptive: Secondary | ICD-10-CM

## 2016-08-30 MED ORDER — NORGESTIMATE-ETH ESTRADIOL 0.25-35 MG-MCG PO TABS: 1.0000 | ORAL_TABLET | Freq: Every day | ORAL | 3 refills | Status: DC

## 2016-08-30 NOTE — Procedures (Signed)
Nexplanon Removal Procedure Note Prior to the procedure being performed, the patient (or guardian) was asked to state their full name, date of birth, type of procedure being performed and the exact location of the operative site. This information was then checked against the documentation in the patient's chart. Prior to the procedure being performed, a "time out" was performed by the physician that confirmed the correct patient, procedure and site.  After informed consent was obtained, the LUE was examined and the nexplanon was easily palpable at the old insertion site. The area was swabbed with alcohol and then injected with 3mL lidocaine with epi. It was then cleaned with betadine and sterile gloves put on. An 11 blade made an incision at the old site and the nexplanon was easily brought up and removed. A steri strip and 2x2 gauze and bandage was then applied.   The patient tolerated the procedure well.  Cornelia Copaharlie Aithan Farrelly, Jr MD Attending [facility] Midwife(Faculty Practice)

## 2016-08-30 NOTE — Progress Notes (Signed)
Obstetrics and Gynecology Return Patient Evaluation  Appointment Date: 08/30/2016  OB[facility]N Clinic: [facility]  Primary Care Provider: Patient, No Pcp Per  Chief Complaint:  Chief Complaint  Patient presents with  . Contraception    History of Present Illness: Catherine Villarreal is a 26 y.o. Caucasian G3P1 (No LMP recorded. Patient has had an implant.), seen for the above chief complaint.   Since getting the Nexplanon a month ago she states she has occasional chest pain, moodiness, diarrhea, constipation and HAs. She denies a h/o migraines, even though it is in her PMHx. She states she was doing fine on OCPs but wanted to switch to a LARC b/c it lasts longer, isn't a qday med, and if she ever lost insurance coverage; her note when it was placed by Dr. Marice Potterove states it was due to AUB.  Patient states she doesn't have a PCP. She is unable to really characterize the chest pain but isn't having it currently. No VB currently  Review of Systems: as noted in the History of Present Illness.   Past Medical History:  Past Medical History:  Diagnosis Date  . Gallstones   . Migraine     Past Surgical History:  No past surgical history on file.  Past Obstetrical History:  OB History  Gravida Para Term Preterm AB Living  3 1 1     1   SAB TAB Ectopic Multiple Live Births        0 1    # Outcome Date GA Lbr Len/2nd Weight Sex Delivery Anes PTL Lv  3 Gravida           2 Term 08/29/15 [id number] 11:03 / 02:12 7 lb 13.8 oz (3.565 kg) M Vag-Spont EPI  LIV  1 Gravida               Past Gynecological History: As per HPI.  Social History:  Social History   Social History  . Marital status: Legally Separated    Spouse name: N/A  . Number of children: N/A  . Years of education: N/A   Occupational History  . Not on file.   Social History Main Topics  . Smoking status: Current Every Day Smoker    Packs/day: 0.25    Types: Cigarettes  . Smokeless tobacco: Never Used  .  Alcohol use Yes     Comment: occas  . Drug use: No  . Sexual activity: Yes    Birth control/ protection: Pill   Other Topics Concern  . Not on file   Social History Narrative  . No narrative on file    Family History:  Family History  Problem Relation Age of Onset  . Cancer Mother    Medications Catherine Villarreal had no medications administered during this visit. Current Outpatient Prescriptions  Medication Sig Dispense Refill  . acetaminophen (TYLENOL) 500 MG tablet Take 500 mg by mouth every 6 (six) hours as needed for mild pain or moderate pain.    Marland Kitchen. etonogestrel (NEXPLANON) 68 MG IMPL implant 1 each by Subdermal route once.     No current facility-administered medications for this visit.     Allergies Patient has no known allergies.   Physical Exam:  BP 122/69   Pulse 83   Wt 135 lb (61.2 kg)   BMI 21.14 kg/m  Body mass index is 21.14 kg/m. General appearance: Well nourished, well developed female in no acute distress.   Laboratory: none  Radiology: none  Assessment: pt doing well s/p  nexplanon removal   Plan:  D/w pt that SE could be nexplanon but is always possible when starting a new BC method. I strongly encouraged her to try it for another month and establish care with a PCP but she strongly wants it out.  Pt told to make a PC appointment or come to the hospital if s/s persist after nexplanon is removed, with all of the hormone should be out of her system in about  3-7d. Doesn't want hormone break and would like to start sprintec she was on before. Told pt to overlap x 7d and if s/s persist to call us and let us know.   RTC PRN  Cornelia Copa MD Attending [facility] Midwife)

## 2017-01-02 ENCOUNTER — Other Ambulatory Visit: Payer: Self-pay

## 2017-01-02 ENCOUNTER — Emergency Department (HOSPITAL_COMMUNITY)
Admission: EM | Admit: 2017-01-02 | Discharge: 2017-01-02 | Disposition: A | Discharge status: Home / Self Care | Payer: Medicaid Other | Attending: Emergency Medicine | Admitting: Emergency Medicine

## 2017-01-02 ENCOUNTER — Encounter (HOSPITAL_COMMUNITY): Payer: Self-pay | Admitting: *Deleted

## 2017-01-02 ENCOUNTER — Emergency Department (HOSPITAL_COMMUNITY): Payer: Medicaid Other

## 2017-01-02 DIAGNOSIS — F17210 Nicotine dependence, cigarettes, uncomplicated: Secondary | ICD-10-CM | POA: Diagnosis not present

## 2017-01-02 DIAGNOSIS — R1011 Right upper quadrant pain: Secondary | ICD-10-CM | POA: Diagnosis present

## 2017-01-02 DIAGNOSIS — J189 Pneumonia, unspecified organism: Secondary | ICD-10-CM

## 2017-01-02 DIAGNOSIS — J181 Lobar pneumonia, unspecified organism: Secondary | ICD-10-CM | POA: Diagnosis not present

## 2017-01-02 DIAGNOSIS — Z79899 Other long term (current) drug therapy: Secondary | ICD-10-CM | POA: Diagnosis not present

## 2017-01-02 LAB — COMPREHENSIVE METABOLIC PANEL
ALBUMIN: 3.9 g/dL (ref 3.5–5.0)
ALK PHOS: 77 U/L (ref 38–126)
ALT: 10 U/L — ABNORMAL LOW (ref 14–54)
ANION GAP: 10 (ref 5–15)
AST: 20 U/L (ref 15–41)
BUN: 8 mg/dL (ref 6–20)
CO2: 26 mmol/L (ref 22–32)
Calcium: 9 mg/dL (ref 8.9–10.3)
Chloride: 98 mmol/L — ABNORMAL LOW (ref 101–111)
Creatinine, Ser: 0.75 mg/dL (ref 0.44–1.00)
GFR calc Af Amer: >60 mL/min (ref >60)
GFR calc non Af Amer: >60 mL/min (ref >60)
GLUCOSE: 106 mg/dL — AB (ref 65–99)
POTASSIUM: 3.6 mmol/L (ref 3.5–5.1)
Sodium: 134 mmol/L — ABNORMAL LOW (ref 135–145)
Total Bilirubin: 1.1 mg/dL (ref 0.3–1.2)
Total Protein: 7.3 g/dL (ref 6.5–8.1)

## 2017-01-02 LAB — CBC WITH DIFFERENTIAL/PLATELET
BASOS ABS: 0 10*3/uL (ref 0.0–0.1)
BASOS PCT: 0 %
EOS ABS: 0 10*3/uL (ref 0.0–0.7)
Eosinophils Relative: 0 %
HCT: 42 % (ref 36.0–46.0)
HEMOGLOBIN: 14.2 g/dL (ref 12.0–15.0)
Lymphocytes Relative: 5 %
Lymphs Abs: 1 10*3/uL (ref 0.7–4.0)
MCH: 30.2 pg (ref 26.0–34.0)
MCHC: 33.8 g/dL (ref 30.0–36.0)
MCV: 89.4 fL (ref 78.0–100.0)
MONOS PCT: 8 %
Monocytes Absolute: 1.7 10*3/uL — ABNORMAL HIGH (ref 0.1–1.0)
NEUTROS ABS: 19.7 10*3/uL — AB (ref 1.7–7.7)
NEUTROS PCT: 87 %
Platelets: 232 10*3/uL (ref 150–400)
RBC: 4.7 MIL/uL (ref 3.87–5.11)
RDW: 12.2 % (ref 11.5–15.5)
WBC: 22.5 10*3/uL — ABNORMAL HIGH (ref 4.0–10.5)

## 2017-01-02 LAB — URINALYSIS, ROUTINE W REFLEX MICROSCOPIC
BILIRUBIN URINE: NEGATIVE
Glucose, UA: NEGATIVE mg/dL
KETONES UR: 20 mg/dL — AB
NITRITE: POSITIVE — AB
PROTEIN: 30 mg/dL — AB
Specific Gravity, Urine: 1.025 (ref 1.005–1.030)
pH: 6 (ref 5.0–8.0)

## 2017-01-02 LAB — LIPASE, BLOOD: Lipase: 18 U/L (ref 11–51)

## 2017-01-02 MED ORDER — SODIUM CHLORIDE 0.9 % IV BOLUS (SEPSIS)
1000.0000 mL | Freq: Once | INTRAVENOUS | Status: AC
  Administered 2017-01-02: 1000 mL via INTRAVENOUS

## 2017-01-02 MED ORDER — AZITHROMYCIN 250 MG PO TABS: ORAL_TABLET | ORAL | 0 refills | Status: DC

## 2017-01-02 MED ORDER — AZITHROMYCIN 500 MG IV SOLR
500.0000 mg | INTRAVENOUS | Status: DC
  Administered 2017-01-02: 500 mg via INTRAVENOUS
  Filled 2017-01-02: qty 500

## 2017-01-02 MED ORDER — ACETAMINOPHEN 500 MG PO TABS
1000.0000 mg | ORAL_TABLET | Freq: Once | ORAL | Status: AC
  Administered 2017-01-02: 1000 mg via ORAL
  Filled 2017-01-02: qty 2

## 2017-01-02 MED ORDER — DEXTROSE 5 % IV SOLN
1.0000 g | Freq: Once | INTRAVENOUS | Status: AC
  Administered 2017-01-02: 1 g via INTRAVENOUS
  Filled 2017-01-02: qty 10

## 2017-01-02 MED ORDER — KETOROLAC TROMETHAMINE 30 MG/ML IJ SOLN
30.0000 mg | Freq: Once | INTRAMUSCULAR | Status: AC
  Administered 2017-01-02: 30 mg via INTRAVENOUS
  Filled 2017-01-02: qty 1

## 2017-01-02 MED ORDER — FENTANYL CITRATE (PF) 100 MCG/2ML IJ SOLN
50.0000 ug | Freq: Once | INTRAMUSCULAR | Status: AC
  Administered 2017-01-02: 50 ug via INTRAVENOUS
  Filled 2017-01-02: qty 2

## 2017-01-02 MED ORDER — HYDROCODONE-ACETAMINOPHEN 5-325 MG PO TABS: 1.0000 | ORAL_TABLET | ORAL | 0 refills | Status: DC | PRN

## 2017-01-02 NOTE — ED Notes (Signed)
Patient back from  X-ray 

## 2017-01-02 NOTE — ED Notes (Signed)
Patient transported to X-ray 

## 2017-01-02 NOTE — ED Provider Notes (Signed)
[facility] EMERGENCY DEPARTMENT Provider Note   CSN: [id number] Arrival date & time: 01/02/17  [id number]     History   Chief Complaint Chief Complaint  Patient presents with  . Abdominal Pain    HPI Catherine Villarreal is a 26 y.o. female.  Cough, right upper quadrant pain with radiation to the back for the past 24 hours.  Recent upper respiratory infection with associated yellow-green sputum.  Status post cholecystectomy earlier this year.  No fever, sweats, chills, dyspnea, rusty sputum.  Pain is worse with cough.  She is normally healthy.  Severity is moderate.      Past Medical History:  Diagnosis Date  . Gallstones   . Migraine     There are no active problems to display for this patient.   Past Surgical History:  Procedure Laterality Date  . CHOLECYSTECTOMY      OB History    Gravida Para Term Preterm AB Living   3 1 1     1    SAB TAB Ectopic Multiple Live Births         0 1       Home Medications    Prior to Admission medications   Medication Sig Start Date End Date Taking? Authorizing Provider  acetaminophen (TYLENOL) 500 MG tablet Take 500 mg by mouth every 6 (six) hours as needed for mild pain or moderate pain.   Yes [provider]  norgestimate-ethinyl estradiol (ORTHO-CYCLEN,SPRINTEC,PREVIFEM) 0.25-35 MG-MCG tablet Take 1 tablet by mouth daily. 08/30/16  Yes Almena BingPickens, Charlie, MD  azithromycin (ZITHROMAX Z-PAK) 250 MG tablet As prescribed 01/02/17   Donnetta Hutchingook, Emonni Depasquale, MD  HYDROcodone-acetaminophen (NORCO/VICODIN) 5-325 MG tablet Take 1 tablet every 4 (four) hours as needed by mouth. 01/02/17   Donnetta Hutchingook, Aluel Schwarz, MD    Family History Family History  Problem Relation Age of Onset  . Cancer Mother     Social History Social History   Tobacco Use  . Smoking status: Current Every Day Smoker    Packs/day: 0.25    Types: Cigarettes  . Smokeless tobacco: Never Used  Substance Use Topics  . Alcohol use: Yes    Comment: occas  . Drug use: No      Allergies   Dilaudid [hydromorphone hcl] and Adhesive [tape]   Review of Systems Review of Systems  All other systems reviewed and are negative.    Physical Exam Updated Vital Signs BP (!) 85/49   Pulse (!) 106   Temp 98.3 F (36.8 C) (Oral)   Resp 19   Ht 5\' 7"  (1.702 m)   Wt 62.6 kg (138 lb)   LMP 12/21/2016   SpO2 99%   BMI 21.61 kg/m   Physical Exam  Constitutional: She is oriented to person, place, and time. She appears well-developed and well-nourished.  HENT:  Head: Normocephalic and atraumatic.  Eyes: Conjunctivae are normal.  Neck: Neck supple.  Cardiovascular: Normal rate and regular rhythm.  Pulmonary/Chest: Effort normal and breath sounds normal.  Abdominal: Soft. Bowel sounds are normal.  Tender right upper quadrant.  Musculoskeletal: Normal range of motion.  Neurological: She is alert and oriented to person, place, and time.  Skin: Skin is warm and dry.  Psychiatric: She has a normal mood and affect. Her behavior is normal.  Nursing note and vitals reviewed.    ED Treatments / Results  Labs (all labs ordered are listed, but only abnormal results are displayed) Labs Reviewed  CBC WITH DIFFERENTIAL/PLATELET - Abnormal; Notable for the following components:  Result Value   WBC 22.5 (*)    Neutro Abs 19.7 (*)    Monocytes Absolute 1.7 (*)    All other components within normal limits  COMPREHENSIVE METABOLIC PANEL - Abnormal; Notable for the following components:   Sodium 134 (*)    Chloride 98 (*)    Glucose, Bld 106 (*)    ALT 10 (*)    All other components within normal limits  URINALYSIS, ROUTINE W REFLEX MICROSCOPIC - Abnormal; Notable for the following components:   Color, Urine AMBER (*)    APPearance CLOUDY (*)    Hgb urine dipstick MODERATE (*)    Ketones, ur 20 (*)    Protein, ur 30 (*)    Nitrite POSITIVE (*)    Leukocytes, UA TRACE (*)    Bacteria, UA FEW (*)    Squamous Epithelial / LPF 6-30 (*)    All other  components within normal limits  LIPASE, BLOOD    EKG  EKG Interpretation  Date/Time:  Tuesday January 02 2017 09:30:00 EST Ventricular Rate:  113 PR Interval:    QRS Duration: 63 QT Interval:  295 QTC Calculation: 405 R Axis:   102 Text Interpretation:  Right and left arm electrode reversal, interpretation assumes no reversal Sinus tachycardia Consider left atrial enlargement Borderline right axis deviation Confirmed by Donnetta Hutching ([id number]) on 01/02/2017 10:05:06 AM       Radiology Dg Chest 2 View  Result Date: 01/02/2017 CLINICAL DATA:  Right upper quadrant pain EXAM: CHEST  2 VIEW COMPARISON:  None. FINDINGS: Airspace opacity in the right lung base concerning for pneumonia. No effusions. Left lung is clear. Heart is normal size. IMPRESSION: Right basilar airspace disease concerning for pneumonia. Electronically Signed   By: Charlett Nose M.D.   On: 01/02/2017 10:43    Procedures Procedures (including critical care time)  Medications Ordered in ED Medications  azithromycin (ZITHROMAX) 500 mg in dextrose 5 % 250 mL IVPB (0 mg Intravenous Stopped 01/02/17 1332)  sodium chloride 0.9 % bolus 1,000 mL (0 mLs Intravenous Stopped 01/02/17 1139)  fentaNYL (SUBLIMAZE) injection 50 mcg (50 mcg Intravenous Given 01/02/17 1018)  sodium chloride 0.9 % bolus 1,000 mL (0 mLs Intravenous Stopped 01/02/17 1335)  cefTRIAXone (ROCEPHIN) 1 g in dextrose 5 % 50 mL IVPB (0 g Intravenous Stopped 01/02/17 1232)  acetaminophen (TYLENOL) tablet 1,000 mg (1,000 mg Oral Given 01/02/17 1138)  ketorolac (TORADOL) 30 MG/ML injection 30 mg (30 mg Intravenous Given 01/02/17 1317)  sodium chloride 0.9 % bolus 1,000 mL (1,000 mLs Intravenous New Bag/Given 01/02/17 1414)     Initial Impression / Assessment and Plan / ED Course  I have reviewed the triage vital signs and the nursing notes.  Pertinent labs & imaging results that were available during my care of the patient were reviewed by me and considered  in my medical decision making (see chart for details).     Patient presents with right upper quadrant pain and cough.  She is slightly hypotensive and tachycardic.  White count 22K.  Chest x-ray reveals right basilar airspace disease.  Patient was given 3 L of IV fluids, IV Rocephin, IV Zithromax.  She is still slightly hypotensive, but her pulse is improved.  She feels stable to go home.  Discussed findings with the patient and her husband.  Discharge medications Zithromax and Vicodin.  Final Clinical Impressions(s) / ED Diagnoses   Final diagnoses:  Community acquired pneumonia of right lower lobe of lung [facility])    ED  Discharge Orders        Ordered    azithromycin (ZITHROMAX Z-PAK) 250 MG tablet     01/02/17 1455    HYDROcodone-acetaminophen (NORCO/VICODIN) 5-325 MG tablet  Every 4 hours PRN     01/02/17 1455       Donnetta Hutchingook, Ladasia Sircy, MD 01/02/17 1500

## 2017-01-02 NOTE — Discharge Instructions (Signed)
You have pneumonia in the base of your right lung.  Increase fluids.  Tylenol or ibuprofen for pain.  I have given you a prescription for pain medicine, but be careful.  Prescription for antibiotic to start tomorrow.  Return if worse.

## 2017-01-02 NOTE — ED Notes (Signed)
ED Provider at bedside. 

## 2017-01-02 NOTE — ED Triage Notes (Signed)
Pt c/o right side pain that radiates around to mid abdomen and also radiates to back. Denies injury. Pt reports vomiting last night, only nausea this morning. Denies diarrhea and urinary symptoms. Pain worse with movement and deep breathing. Pt reports recent cold symptoms such as yellow/green productive cough and nasal drainage x month and a half.

## 2017-06-30 ENCOUNTER — Other Ambulatory Visit: Payer: Self-pay | Admitting: Student

## 2017-06-30 DIAGNOSIS — Z30011 Encounter for initial prescription of contraceptive pills: Secondary | ICD-10-CM

## 2017-07-02 ENCOUNTER — Telehealth: Payer: Self-pay | Admitting: *Deleted

## 2017-07-02 NOTE — Telephone Encounter (Signed)
Asha Grumbine called front desk and states she has ran out of her bcps 2 days ago and needs a refill. Per chart should have years worth. Called [place] and she states pharmacy states she does not have any refills .  States she ran out , now would be 3 days. Has had unprotected intercourse 3 days ago. Discussed with Steward Drone, CNM - informed patient she must abstain from intercourse or must use condoms and come in 2 weeks to get upt- if negative we can refill 2 months of pills to last until July- needs annual exam in July. She agreed to come in 5/24 /19 for upt. She voices understanding.

## 2017-07-13 ENCOUNTER — Ambulatory Visit (INDEPENDENT_AMBULATORY_CARE_PROVIDER_SITE_OTHER): Payer: Medicaid Other

## 2017-07-13 VITALS — Wt 139.0 lb

## 2017-07-13 DIAGNOSIS — Z3202 Encounter for pregnancy test, result negative: Secondary | ICD-10-CM

## 2017-07-13 DIAGNOSIS — Z30011 Encounter for initial prescription of contraceptive pills: Secondary | ICD-10-CM

## 2017-07-13 LAB — POCT URINE PREGNANCY: PREG TEST UR: NEGATIVE

## 2017-07-13 MED ORDER — NORGESTIMATE-ETH ESTRADIOL 0.25-35 MG-MCG PO TABS: 1.0000 | ORAL_TABLET | Freq: Every day | ORAL | 0 refills | Status: DC

## 2017-07-13 NOTE — Progress Notes (Signed)
Patient here for birth control refill.  She has missed some pills so urine pregnancy test done today.  Test negative.  Sprintec sent to pharmacy.  Patient aware.  She is going to make an appointment to do discuss other birth control options.

## 2017-07-13 NOTE — Patient Instructions (Signed)
Contraception Choices Contraception, also called birth control, refers to methods or devices that prevent pregnancy. Hormonal methods Contraceptive implant A contraceptive implant is a thin, plastic tube that contains a hormone. It is inserted into the upper part of the arm. It can remain in place for up to 3 years. Progestin-only injections Progestin-only injections are injections of progestin, a synthetic form of the hormone progesterone. They are given every 3 months by a health care provider. Birth control pills Birth control pills are pills that contain hormones that prevent pregnancy. They must be taken once a day, preferably at the same time each day. Birth control patch The birth control patch contains hormones that prevent pregnancy. It is placed on the skin and must be changed once a week for three weeks and removed on the fourth week. A prescription is needed to use this method of contraception. Vaginal ring A vaginal ring contains hormones that prevent pregnancy. It is placed in the vagina for three weeks and removed on the fourth week. After that, the process is repeated with a new ring. A prescription is needed to use this method of contraception. Emergency contraceptive Emergency contraceptives prevent pregnancy after unprotected sex. They come in pill form and can be taken up to 5 days after sex. They work best the sooner they are taken after having sex. Most emergency contraceptives are available without a prescription. This method should not be used as your only form of birth control. Barrier methods Female condom A female condom is a thin sheath that is worn over the penis during sex. Condoms keep sperm from going inside a woman's body. They can be used with a spermicide to increase their effectiveness. They should be disposed after a single use. Female condom A female condom is a soft, loose-fitting sheath that is put into the vagina before sex. The condom keeps sperm from going  inside a woman's body. They should be disposed after a single use. Diaphragm A diaphragm is a soft, dome-shaped barrier. It is inserted into the vagina before sex, along with a spermicide. The diaphragm blocks sperm from entering the uterus, and the spermicide kills sperm. A diaphragm should be left in the vagina for 6-8 hours after sex and removed within 24 hours. A diaphragm is prescribed and fitted by a health care provider. A diaphragm should be replaced every 1-2 years, after giving birth, after gaining more than 15 lb (6.8 kg), and after pelvic surgery. Cervical cap A cervical cap is a round, soft latex or plastic cup that fits over the cervix. It is inserted into the vagina before sex, along with spermicide. It blocks sperm from entering the uterus. The cap should be left in place for 6-8 hours after sex and removed within 48 hours. A cervical cap must be prescribed and fitted by a health care provider. It should be replaced every 2 years. Sponge A sponge is a soft, circular piece of polyurethane foam with spermicide on it. The sponge helps block sperm from entering the uterus, and the spermicide kills sperm. To use it, you make it wet and then insert it into the vagina. It should be inserted before sex, left in for at least 6 hours after sex, and removed and thrown away within 30 hours. Spermicides Spermicides are chemicals that kill or block sperm from entering the cervix and uterus. They can come as a cream, jelly, suppository, foam, or tablet. A spermicide should be inserted into the vagina with an applicator at least 10-15 minutes before   sex to allow time for it to work. The process must be repeated every time you have sex. Spermicides do not require a prescription. Intrauterine contraception Intrauterine device (IUD) An IUD is a T-shaped device that is put in a woman's uterus. There are two types:  Hormone IUD.This type contains progestin, a synthetic form of the hormone progesterone. This  type can stay in place for 3-5 years.  Copper IUD.This type is wrapped in copper wire. It can stay in place for 10 years.  Permanent methods of contraception Female tubal ligation In this method, a woman's fallopian tubes are sealed, tied, or blocked during surgery to prevent eggs from traveling to the uterus. Hysteroscopic sterilization In this method, a small, flexible insert is placed into each fallopian tube. The inserts cause scar tissue to form in the fallopian tubes and block them, so sperm cannot reach an egg. The procedure takes about 3 months to be effective. Another form of birth control must be used during those 3 months. Female sterilization This is a procedure to tie off the tubes that carry sperm (vasectomy). After the procedure, the man can still ejaculate fluid (semen). Natural planning methods Natural family planning In this method, a couple does not have sex on days when the woman could become pregnant. Calendar method This means keeping track of the length of each menstrual cycle, identifying the days when pregnancy can happen, and not having sex on those days. Ovulation method In this method, a couple avoids sex during ovulation. Symptothermal method This method involves not having sex during ovulation. The woman typically checks for ovulation by watching changes in her temperature and in the consistency of cervical mucus. Post-ovulation method In this method, a couple waits to have sex until after ovulation. Summary  Contraception, also called birth control, means methods or devices that prevent pregnancy.  Hormonal methods of contraception include implants, injections, pills, patches, vaginal rings, and emergency contraceptives.  Barrier methods of contraception can include female condoms, female condoms, diaphragms, cervical caps, sponges, and spermicides.  There are two types of IUDs (intrauterine devices). An IUD can be put in a woman's uterus to prevent pregnancy  for 3-5 years.  Permanent sterilization can be done through a procedure for males, females, or both.  Natural family planning methods involve not having sex on days when the woman could become pregnant. This information is not intended to replace advice given to you by your health care provider. Make sure you discuss any questions you have with your health care provider. Document Released: 02/06/2005 Document Revised: 03/11/2016 Document Reviewed: 03/11/2016 Elsevier Interactive Patient Education  2018 [employer]  

## 2017-07-19 DIAGNOSIS — J018 Other acute sinusitis: Secondary | ICD-10-CM | POA: Diagnosis not present

## 2017-08-17 ENCOUNTER — Other Ambulatory Visit (HOSPITAL_COMMUNITY)
Admission: RE | Admit: 2017-08-17 | Discharge: 2017-08-17 | Disposition: A | Discharge status: Home / Self Care | Payer: Medicaid Other | Source: Ambulatory Visit | Attending: Family Medicine | Admitting: Family Medicine

## 2017-08-17 ENCOUNTER — Encounter: Payer: Self-pay | Admitting: Nurse Practitioner

## 2017-08-17 ENCOUNTER — Ambulatory Visit (INDEPENDENT_AMBULATORY_CARE_PROVIDER_SITE_OTHER): Payer: Medicaid Other | Admitting: Nurse Practitioner

## 2017-08-17 VITALS — BP 104/75 | HR 80 | Wt 141.3 lb

## 2017-08-17 DIAGNOSIS — Z30430 Encounter for insertion of intrauterine contraceptive device: Secondary | ICD-10-CM | POA: Diagnosis not present

## 2017-08-17 DIAGNOSIS — Z3202 Encounter for pregnancy test, result negative: Secondary | ICD-10-CM | POA: Diagnosis not present

## 2017-08-17 DIAGNOSIS — Z01419 Encounter for gynecological examination (general) (routine) without abnormal findings: Secondary | ICD-10-CM | POA: Insufficient documentation

## 2017-08-17 LAB — POCT PREGNANCY, URINE: Preg Test, Ur: NEGATIVE

## 2017-08-17 MED ORDER — IBUPROFEN 200 MG PO TABS
600.0000 mg | ORAL_TABLET | Freq: Once | ORAL | Status: AC
  Administered 2017-08-17: 600 mg via ORAL

## 2017-08-17 MED ORDER — PARAGARD INTRAUTERINE COPPER IU IUD
1.0000 | INTRAUTERINE_SYSTEM | Freq: Once | INTRAUTERINE | Status: AC
  Administered 2017-08-17: 1 via INTRAUTERINE

## 2017-08-17 NOTE — Progress Notes (Signed)
Here for annual exam and would like to get paragard. Is on sprintec now and on antibiotics .

## 2017-08-17 NOTE — Patient Instructions (Signed)
Call if you are having any problems

## 2017-08-17 NOTE — Progress Notes (Signed)
GYNECOLOGY ANNUAL PREVENTATIVE CARE ENCOUNTER NOTE  Subjective:   Catherine Villarreal is a 27 y.o. G24P1001 female here for a routine annual gynecologic exam.  Current complaints: None.   Denies abnormal vaginal bleeding, discharge, pelvic pain, problems with intercourse or other gynecologic concerns. Currently on birth control pills and taking them daily.  Wants paragard IUD as she has difficulty with hormones.  When on Nexplanon had severe mood issues and does not want any problems with emotional instability.  Normal periods are 7 days with cramping.  Discussed with her the usual course of bleeding with a Paragard IUD would be heavier and longer bleeding with more cramping.  Client tearful as she has researched contraceptive methods and is convinced that she would be better without hormones.  She does not want to be pregnant.  Made a plan with her to use ibuprofen around the clock for the next couple of days and with her menses to avert problems with cramping.  Additionally will use birth control pills as back up for the next 4 weeks.   Gynecologic History Patient's last menstrual period was 08/03/2017. Contraception: OCP (estrogen/progesterone)  Obstetric History OB History  Gravida Para Term Preterm AB Living  3 1 1     1   SAB TAB Ectopic Multiple Live Births        0 1    # Outcome Date GA Lbr Len/2nd Weight Sex Delivery Anes PTL Lv  3 Gravida           2 Term 08/29/15 [redacted]w[redacted]d 11:03 / 02:12 7 lb 13.8 oz (3.565 kg) M Vag-Spont EPI  LIV  1 Gravida             Past Medical History:  Diagnosis Date  . Gallstones   . Migraine     Past Surgical History:  Procedure Laterality Date  . CHOLECYSTECTOMY      Current Outpatient Medications on File Prior to Visit  Medication Sig Dispense Refill  . acetaminophen-codeine (TYLENOL #3) 300-30 MG tablet TAKE 1TAB EVERY 6 TO 8 HOURS AS NEEDED  0  . amoxicillin (AMOXIL) 500 MG tablet TAKE 1 TABLET BY MOUTH FOUR TIMES A DAY UNTIL GONE  0  .  ibuprofen (ADVIL,MOTRIN) 800 MG tablet Take 800 mg by mouth every 8 (eight) hours as needed.  0  . norgestimate-ethinyl estradiol (ORTHO-CYCLEN,SPRINTEC,PREVIFEM) 0.25-35 MG-MCG tablet Take 1 tablet by mouth daily. 3 Package 0  . norgestimate-ethinyl estradiol (SPRINTEC 28) 0.25-35 MG-MCG tablet Take by mouth.     No current facility-administered medications on file prior to visit.     Allergies  Allergen Reactions  . Dilaudid [Hydromorphone Hcl]     migraine  . Adhesive [Tape] Rash    Social History   Socioeconomic History  . Marital status: Divorced    Spouse name: Not on file  . Number of children: Not on file  . Years of education: Not on file  . Highest education level: Not on file  Occupational History  . Not on file  Social Needs  . Financial resource strain: Not on file  . Food insecurity:    Worry: Not on file    Inability: Not on file  . Transportation needs:    Medical: Not on file    Non-medical: Not on file  Tobacco Use  . Smoking status: Current Every Day Smoker    Packs/day: 0.50    Types: Cigarettes  . Smokeless tobacco: Never Used  Substance and Sexual Activity  . Alcohol  use: Yes    Comment: occas  . Drug use: No  . Sexual activity: Yes    Birth control/protection: Pill  Lifestyle  . Physical activity:    Days per week: Not on file    Minutes per session: Not on file  . Stress: Not on file  Relationships  . Social connections:    Talks on phone: Not on file    Gets together: Not on file    Attends religious service: Not on file    Active member of club or organization: Not on file    Attends meetings of clubs or organizations: Not on file    Relationship status: Not on file  . Intimate partner violence:    Fear of current or ex partner: Not on file    Emotionally abused: Not on file    Physically abused: Not on file    Forced sexual activity: Not on file  Other Topics Concern  . Not on file  Social History Narrative  . Not on file     Family History  Problem Relation Age of Onset  . Cancer Mother   . Breast cancer Mother   . Thyroid cancer Mother     The following portions of the patient's history were reviewed and updated as appropriate: allergies, current medications, past family history, past medical history, past social history, past surgical history and problem list.  Review of Systems Pertinent items noted in HPI and remainder of comprehensive ROS otherwise negative.   Objective:  BP 104/75   Pulse 80   Wt 141 lb 4.8 oz (64.1 kg)   LMP 08/03/2017   BMI 22.13 kg/m  CONSTITUTIONAL: Well-developed, well-nourished female in no acute distress.  HENT:  Normocephalic, atraumatic, External right and left ear normal. EYES: Conjunctivae and EOM are normal. Pupils are equal, round. No scleral icterus.  NECK: Normal range of motion, supple, no masses.  Normal thyroid.  SKIN: Skin is warm and dry. No rash noted. Not diaphoretic. No erythema. No pallor. NEUROLOGIC: Alert and oriented to person, place, and time. Normal reflexes, muscle tone coordination. No cranial nerve deficit noted. PSYCHIATRIC: Normal mood and affect. Normal behavior. Normal judgment and thought content. CARDIOVASCULAR: Normal heart rate noted, regular rhythm RESPIRATORY: Clear to auscultation bilaterally. Effort and breath sounds normal, no problems with respiration noted. BREASTS: Symmetric in size. No masses, skin changes, nipple drainage, or lymphadenopathy. ABDOMEN: Soft, no distention noted.  No tenderness, rebound or guarding.  PELVIC: Normal appearing external genitalia; normal appearing vaginal mucosa and cervix.  No abnormal discharge noted.  Pap smear obtained.  Normal uterine size, no other palpable masses, no uterine or adnexal tenderness. MUSCULOSKELETAL: Normal range of motion. No tenderness.  No cyanosis, clubbing, or edema.  2+ distal pulses.  IUD Insertion Procedure Note Patient identified, informed consent performed, consent  signed.   Discussed risks of irregular bleeding, cramping, infection, malpositioning or misplacement of the IUD outside the uterus which may require further procedure such as laparoscopy. Time out was performed.  Urine pregnancy test negative.  Speculum placed in the vagina.  Cervix visualized.  Cleaned with Betadine x 2.  Hurricaine spray used.  Grasped anteriorly with a single tooth tenaculum.  Uterus sounded to 7.5 cm.  Paragard IUD placed per manufacturer's recommendations.  Strings trimmed to 3 cm. Tenaculum was removed, good hemostasis noted.  Patient tolerated procedure well.   Patient was given post-procedure instructions.  She was advised to have backup contraception for one week.  Patient was also asked  to check IUD strings periodically and follow up in 4 weeks for IUD check.    Assessment and Plan:  1. Well woman exam - Cytology - PAP Encouraged to continue the process for smoking cessation.  Client aware she needs to stop smoking and has attempted in the past but started again due to stressful social situation.  Did not yet, but would advise, meeting with [facility] here in clinic for learning additional coping strategies for stress relief.  2. Encounter for IUD insertion Paragard inserted Continue birth control pills for one-two months Prescription for ibuprofen to relieve cramping  Will follow up results of pap smear and manage accordingly. Routine preventative health maintenance measures emphasized. Please refer to After Visit Summary for other counseling recommendations.    Nolene Bernheim, RN, MSN, NP-BC Nurse Practitioner, [facility] for [facility]

## 2017-08-18 ENCOUNTER — Encounter: Payer: Self-pay | Admitting: Nurse Practitioner

## 2017-08-18 DIAGNOSIS — Z30430 Encounter for insertion of intrauterine contraceptive device: Secondary | ICD-10-CM | POA: Insufficient documentation

## 2017-08-21 ENCOUNTER — Encounter: Payer: Self-pay | Admitting: Nurse Practitioner

## 2017-08-21 DIAGNOSIS — R87615 Unsatisfactory cytologic smear of cervix: Secondary | ICD-10-CM | POA: Insufficient documentation

## 2017-08-21 LAB — CYTOLOGY - PAP
Adequacy: ABSENT
DIAGNOSIS: NEGATIVE

## 2017-08-27 ENCOUNTER — Encounter: Payer: Self-pay | Admitting: *Deleted

## 2017-09-13 DIAGNOSIS — M79631 Pain in right forearm: Secondary | ICD-10-CM | POA: Diagnosis not present

## 2017-09-13 DIAGNOSIS — S59911A Unspecified injury of right forearm, initial encounter: Secondary | ICD-10-CM | POA: Diagnosis not present

## 2017-09-13 DIAGNOSIS — S5011XA Contusion of right forearm, initial encounter: Secondary | ICD-10-CM | POA: Diagnosis not present

## 2017-09-18 ENCOUNTER — Ambulatory Visit (INDEPENDENT_AMBULATORY_CARE_PROVIDER_SITE_OTHER): Payer: Medicaid Other | Admitting: Student

## 2017-09-18 ENCOUNTER — Encounter: Payer: Self-pay | Admitting: Student

## 2017-09-18 VITALS — BP 96/55 | HR 62 | Ht 67.0 in | Wt 136.0 lb

## 2017-09-18 DIAGNOSIS — Z30431 Encounter for routine checking of intrauterine contraceptive device: Secondary | ICD-10-CM | POA: Diagnosis not present

## 2017-09-18 NOTE — Patient Instructions (Signed)
Preventing Cervical Cancer Cervical cancer is cancer that grows on the cervix. The cervix is at the bottom of the uterus. It connects the uterus to the vagina. The uterus is where a baby develops during pregnancy. Cancer occurs when cells become abnormal and start to grow out of control. Cervical cancer grows slowly and may not cause any symptoms at first. Over time, the cancer can grow deep into the cervix tissue and spread to other areas. If it is found early, cervical cancer can be treated effectively. You can also take steps to prevent this type of cancer. Most cases of cervical cancer are caused by an STI (sexually transmitted infection) called human papillomavirus (HPV). One way to reduce your risk of cervical cancer is to avoid infection with the HPV virus. You can do this by practicing safe sex and by getting the HPV vaccine. Getting regular Pap tests is also important because this can help identify changes in cells that could lead to cancer. Your chances of getting this disease can also be reduced by making certain lifestyle changes. How can I protect myself from cervical cancer? Preventing HPV infection  Ask your health care provider about getting the HPV vaccine. If you are 26 years old or younger, you may need to get this vaccine, which is given in three doses over 6 months. This vaccine protects against the types of HPV that could cause cancer.  Limit the number of people you have sex with. Also avoid having sex with people who have had many sex partners.  Use a latex condom during sex. Getting Pap tests  Get Pap tests regularly, starting at age 21. Talk with your health care provider about how often you need these tests. ? Most women who are 21?26 years of age should have a Pap test every 3 years. ? Most women who are 30?26 years of age should have a Pap test in combination with an HPV test every 5 years. ? Women with a higher risk of cervical cancer, such as those with a weakened  immune system or those who have been exposed to the drug diethylstilbestrol (DES), may need more frequent testing. Making other lifestyle changes  Do not use any products that contain nicotine or tobacco, such as cigarettes and e-cigarettes. If you need help quitting, ask your health care provider.  Eat at least 5 servings of fruits and vegetables every day.  Lose weight if you are overweight. Why are these changes important?  These changes and screening tests are designed to address the factors that are known to increase the risk of cervical cancer. Taking these steps is the best way to reduce your risk.  Having regular Pap tests will help identify changes in cells that could lead to cancer. Steps can then be taken to prevent cancer from developing.  These changes will also help find cervical cancer early. This type of cancer can be treated effectively if it is found early. It can be more dangerous and difficult to treat if cancer has grown deep into your cervix or has spread.  In addition to making you less likely to get cervical cancer, these changes will also provide other health benefits, such as the following: ? Practicing safe sex is important for preventing STIs and unplanned pregnancies. ? Avoiding tobacco can reduce your risk for other cancers and health issues. ? Eating a healthy diet and maintaining a healthy weight are good for your overall health. What can happen if changes are not made? In the   early stages, cervical cancer might not have any symptoms. It can take many years for the cancer to grow and get deep into the cervix tissue. This may be happening without you knowing about it. If you develop any symptoms, such as pelvic pain or unusual discharge or bleeding from your vagina, you should see your health care provider right away. If cervical cancer is not found early, you might need treatments such as radiation, chemotherapy, or surgery. In some cases, surgery may mean that  you will not be able to get pregnant or carry a pregnancy to term. Where to find support: Talk with your health care provider, school nurse, or local health department for guidance about screening and vaccination. Some children and teens may be able to get the HPV vaccine free of charge through the U.[place] government's Vaccines for Children (VFC) program. Other places that provide vaccinations include:  Public health clinics. Check with your local health department.  Federally Qualified Health Centers, where you would pay only what you can afford. To find one near you, check this website: www.fqhc.org/find-an-fqhc/  [facility]ics. These are part of a program for Medicare and Medicaid patients who live in rural areas.  The National Breast and Cervical Cancer Early Detection Program also provides breast and cervical cancer screenings and diagnostic services to low-income, uninsured, and underinsured women. Cervical cancer can be passed down through families. Talk with your health care provider or genetic counselor to learn more about genetic testing for cancer. Where to find more information: Learn more about cervical cancer from:  American College of Gynecology: www.acog.org/Patients/FAQs/Cervical-Cancer  American Cancer Society: www.cancer.org/cancer/cervicalcancer/  U.S. Centers for Disease Control and Prevention: www.cdc.gov/cancer/cervical/  Summary  Talk with your health care provider about getting the HPV vaccine.  Be sure to get regular Pap tests as recommended by your health care provider.  See your health care provider right away if you have any pelvic pain or unusual discharge or bleeding from your vagina. This information is not intended to replace advice given to you by your health care provider. Make sure you discuss any questions you have with your health care provider. Document Released: 02/21/2015 Document Revised: 10/05/2015 Document Reviewed: 10/05/2015 Elsevier  Interactive Patient Education  2018 Elsevier Inc.  

## 2017-09-18 NOTE — Progress Notes (Signed)
History:  Catherine Villarreal is a 27 y.o. G3P1001 who presents to clinic today for IUD insertion check-up. Her IUD was inserted on 08/17/17.  She reports mild intermittent spotting and abdominal cramping, but states that it is tolerable and less severe than her normal menstrual cramps and bleeding. She reports her LMP was 1 - 2 weeks ago. She denies other abdominal pain, heavy bleeding, vaginal discharge, dyspareunia or fever.   Patient was counseled on common symptoms, use of ibuprofen for cramping, and timeline for relief of symptoms and 10-year timeline for IUD replacement.  Counseled to return for routine PAP in 1 year.  The following portions of the patient's history were reviewed and updated as appropriate: allergies, current medications, family history, past medical history, social history, past surgical history and problem list.  Review of Systems:  Review of Systems  Constitutional: Negative for chills, fever and malaise/fatigue.  Respiratory: Negative for cough, shortness of breath and wheezing.   Cardiovascular: Negative for leg swelling.  Gastrointestinal: Negative for abdominal pain, nausea and vomiting.      Objective:  Physical Exam BP (!) 96/55   Pulse 62   Ht 5\' 7"  (1.702 m)   Wt 136 lb (61.7 kg)   BMI 21.30 kg/m  Physical Exam  Constitutional: She is oriented to person, place, and time. She appears well-developed and well-nourished.  HENT:  Head: Normocephalic and atraumatic.  Abdominal: Soft.  Genitourinary: Vagina normal and uterus normal. Cervix exhibits no motion tenderness, no discharge and no friability. No erythema, tenderness or bleeding in the vagina. No signs of injury around the vagina. No vaginal discharge found.    Neurological: She is alert and oriented to person, place, and time.  Skin: Skin is warm and dry.  Psychiatric: She has a normal mood and affect. Her behavior is normal.     Labs and Imaging No results found for this or any previous  visit (from the past 24 hour(s)).  No results found.   Assessment & Plan:  1. Encounter for routine checking of intrauterine contraceptive device  - Counseled on common symptoms, use of ibuprofen for cramping, and timeline for relief of symptoms and 10-year timeline for IUD replacement.  - Instructed to return for onset of worsened bleeding or cramping. -Return to clinic in 1 year for repeat Pap.  Margarita RanaRose, Harrison D, Student-PA 09/18/2017 10:08 AM     I confirm that I have verified the information documented in the physician assistant's note and that I have also personally performed the physical exam and all medical decision making activities.  Luna KitchensKathryn Toyia Jelinek

## 2017-12-01 IMAGING — DX DG ANKLE COMPLETE 3+V*R*
3 series · 3 of 3 positions shown · non-contrast
Comparison: None.

CLINICAL DATA: Twisted right ankle [REDACTED] night with ankle
swelling and pain.

EXAM:
RIGHT ANKLE - COMPLETE 3+ VIEW

[ankle ap]
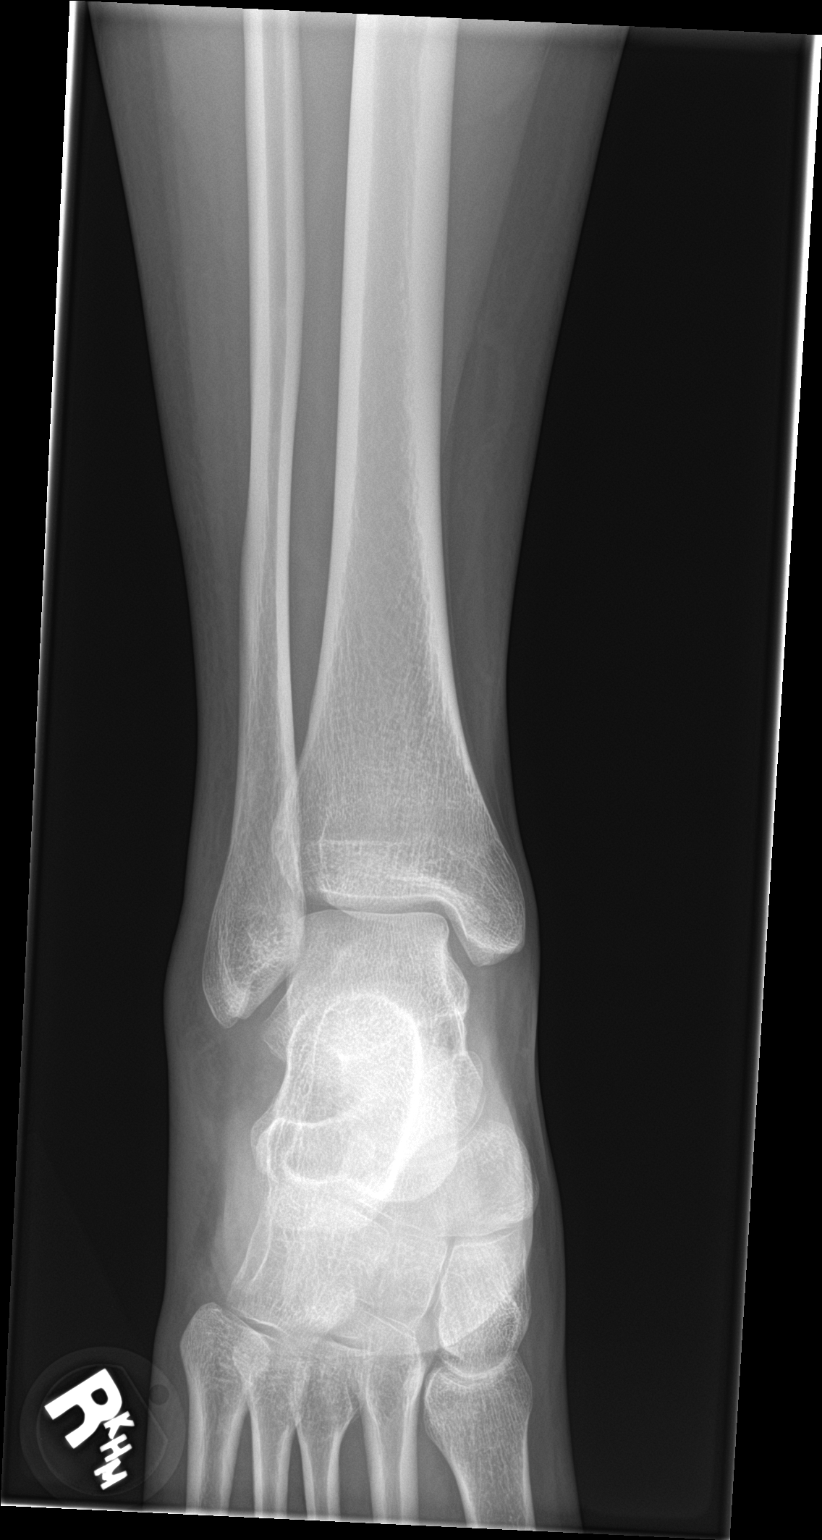

[ankle obl]
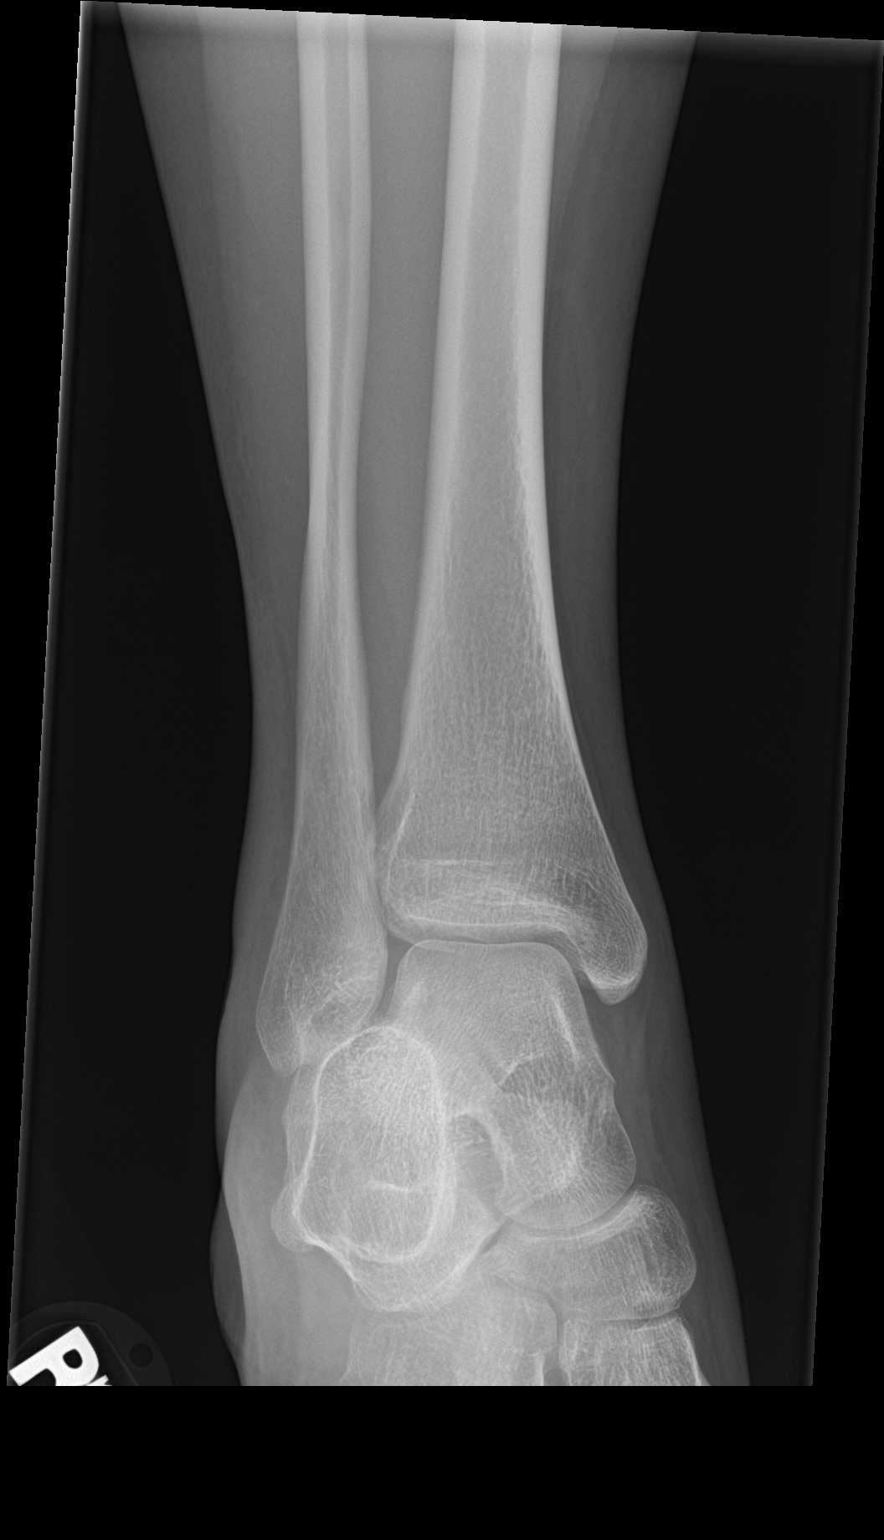

[ankle lat]
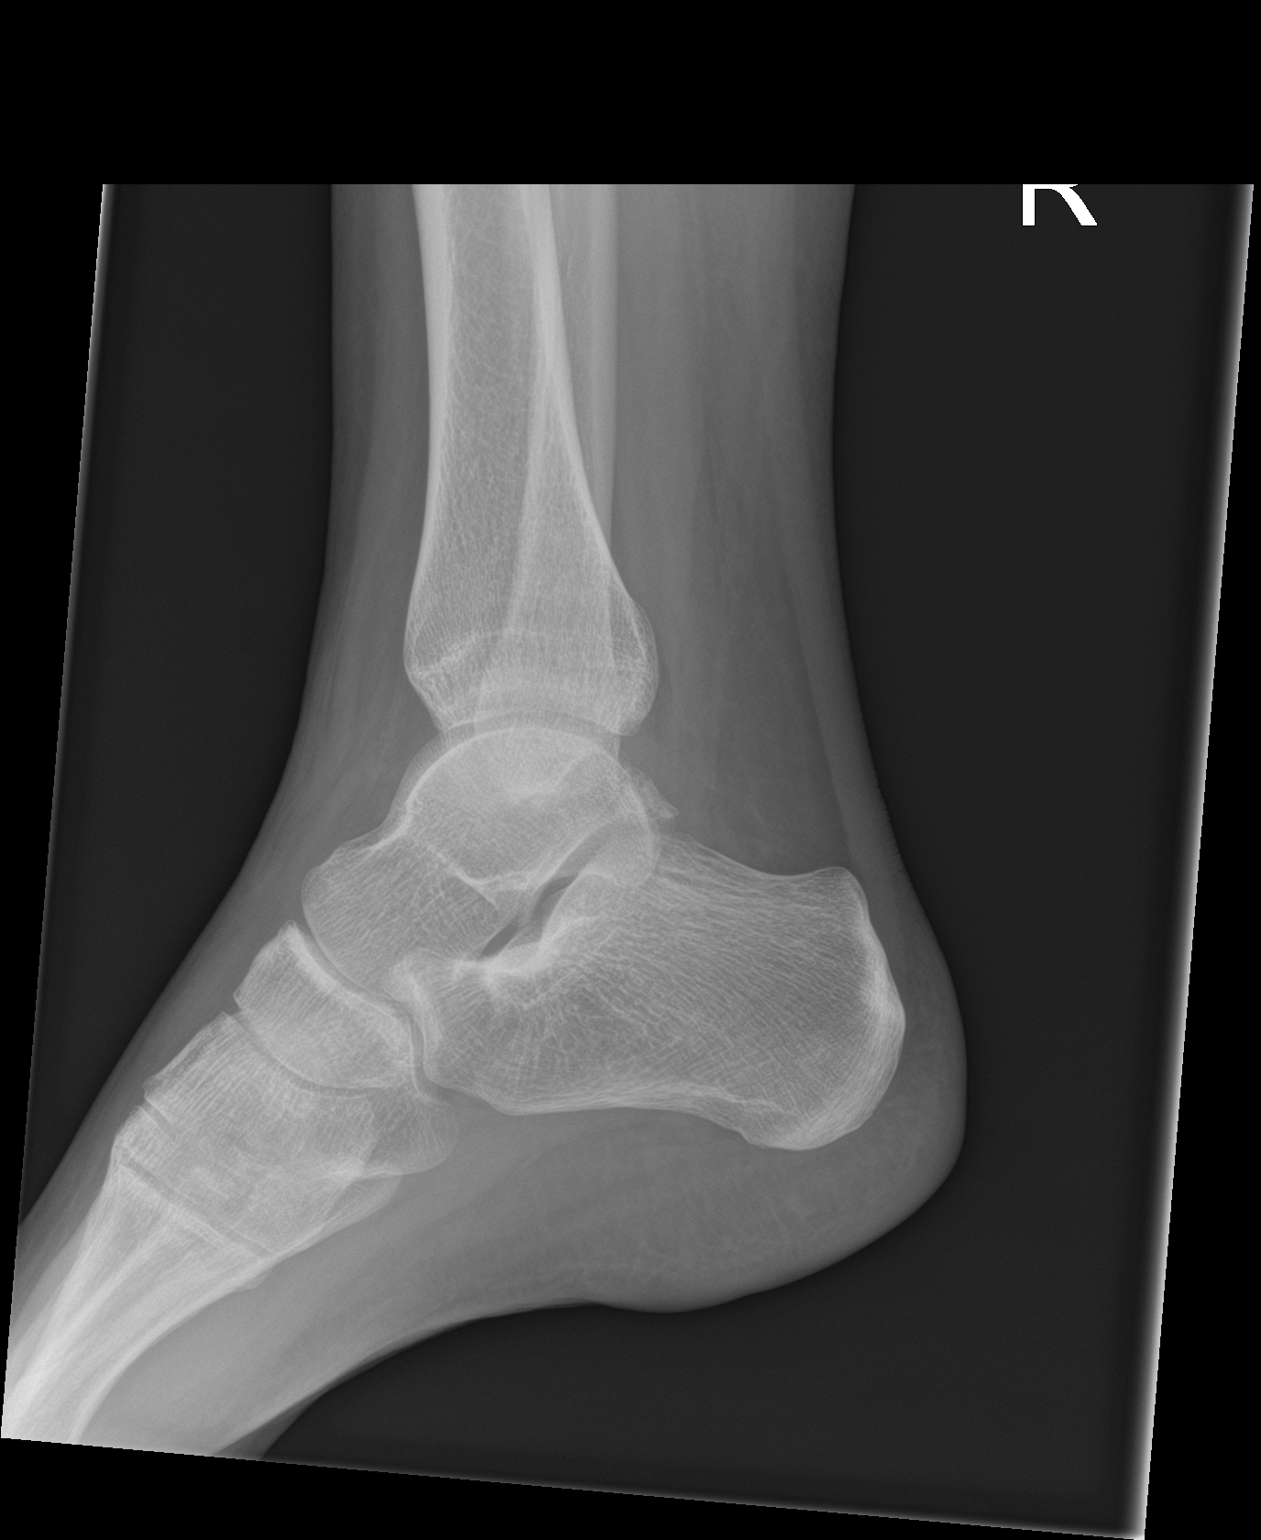

[3 of 3 positions shown; findings below may reference images not displayed]

FINDINGS: There is no evidence of fracture, dislocation, or joint effusion.
There is no evidence of arthropathy or other focal bone abnormality.
Soft tissues are unremarkable.
IMPRESSION: No acute fracture or dislocation.

## 2017-12-12 DIAGNOSIS — S71111A Laceration without foreign body, right thigh, initial encounter: Secondary | ICD-10-CM | POA: Diagnosis not present

## 2018-01-28 DIAGNOSIS — J019 Acute sinusitis, unspecified: Secondary | ICD-10-CM | POA: Diagnosis not present

## 2018-04-25 ENCOUNTER — Other Ambulatory Visit: Payer: Self-pay

## 2018-04-25 ENCOUNTER — Encounter: Payer: Self-pay | Admitting: Nurse Practitioner

## 2018-04-25 ENCOUNTER — Ambulatory Visit (INDEPENDENT_AMBULATORY_CARE_PROVIDER_SITE_OTHER): Payer: Medicaid Other | Admitting: Nurse Practitioner

## 2018-04-25 ENCOUNTER — Other Ambulatory Visit (HOSPITAL_COMMUNITY)
Admission: RE | Admit: 2018-04-25 | Discharge: 2018-04-25 | Disposition: A | Discharge status: Home / Self Care | Payer: Medicaid Other | Source: Ambulatory Visit | Attending: Nurse Practitioner | Admitting: Nurse Practitioner

## 2018-04-25 VITALS — BP 122/75 | HR 96 | Ht 67.0 in | Wt 144.0 lb

## 2018-04-25 DIAGNOSIS — N939 Abnormal uterine and vaginal bleeding, unspecified: Secondary | ICD-10-CM

## 2018-04-25 DIAGNOSIS — R87615 Unsatisfactory cytologic smear of cervix: Secondary | ICD-10-CM | POA: Diagnosis not present

## 2018-04-25 DIAGNOSIS — Z975 Presence of (intrauterine) contraceptive device: Secondary | ICD-10-CM | POA: Insufficient documentation

## 2018-04-25 DIAGNOSIS — Z30431 Encounter for routine checking of intrauterine contraceptive device: Secondary | ICD-10-CM

## 2018-04-25 NOTE — Progress Notes (Signed)
   GYNECOLOGY OFFICE VISIT NOTE   History:  28 y.o. G2P1011 here today for irregular bleeding from paragard IUD - thinks it may have come out - could only feel one thread.. She denies any abnormal vaginal discharge, pelvic pain or other concerns.   Reviewed her records - has Paragard IUD as she does not tolerate hormones - has violent mood swings from previous episodes of hormones.  Has had a history of additional sex partners in past couple of months and is worried she has a STI.  Past Medical History:  Diagnosis Date  . Gallstones   . Migraine     Past Surgical History:  Procedure Laterality Date  . CHOLECYSTECTOMY      The following portions of the patient's history were reviewed and updated as appropriate: allergies, current medications, past family history, past medical history, past social history, past surgical history and problem list.   Health Maintenance:  Normal pap a in 2019.    Review of Systems:  Pertinent items noted in HPI and remainder of comprehensive ROS otherwise negative.  Objective:  Physical Exam BP 122/75   Pulse 96   Ht 5\' 7"  (1.702 m)   Wt 144 lb (65.3 kg)   BMI 22.55 kg/m  CONSTITUTIONAL: Well-developed, well-nourished female in no acute distress.  HENT:  Normocephalic, atraumatic. External right and left ear normal.  EYES: Conjunctivae and EOM are normal. Pupils are equal, round.  No scleral icterus.  NECK: Normal range of motion, supple, no masses SKIN: Skin is warm and dry. No rash noted. Not diaphoretic. No erythema. No pallor. NEUROLOGIC: Alert and oriented to person, place, and time. Normal muscle tone coordination. No cranial nerve deficit noted. PSYCHIATRIC: Normal mood and affect. Normal behavior. Normal judgment and thought content. ABDOMEN: Soft, no distention noted.   PELVIC: IUD strings seen, no part of IUD seen in cervix, no bleeding, no abnormal discharge seen, bimanual - no part of IUD palpated. MUSCULOSKELETAL: Normal range of  motion. No edema noted.  Labs and Imaging No results found.  Assessment & Plan:  1. Abnormal vaginal bleeding Started only in the last couple of months and client suspects it may be due to a STI.  2. IUD check up Partner accompanies her today and if the abnormal bleeding is not due to infection, he reports he may seek a vasectomy.   Routine preventative health maintenance measures emphasized. Please refer to After Visit Summary for other counseling recommendations.   No follow-ups on file.   Total face-to-face time with patient: 15 minutes.  Over 50% of encounter was spent on counseling and coordination of care.  Nolene Bernheim, RN, MSN, NP-BC Nurse Practitioner, [facility] for [employer], [facility] 04/25/2018 6:51 PM

## 2018-04-25 NOTE — Progress Notes (Signed)
States IUD in almost a year . C/o last 2 months bleeding for 2 weeks, then stop, then spot then start bleeding again. Also reports cannot feel both strings for about a month. Wants to be tested for trich.

## 2018-04-29 LAB — CERVICOVAGINAL ANCILLARY ONLY
BACTERIAL VAGINITIS: POSITIVE — AB
CANDIDA VAGINITIS: NEGATIVE
Chlamydia: NEGATIVE
Neisseria Gonorrhea: NEGATIVE
Trichomonas: NEGATIVE

## 2018-04-30 ENCOUNTER — Telehealth: Payer: Self-pay | Admitting: *Deleted

## 2018-04-30 MED ORDER — METRONIDAZOLE 500 MG PO TABS: 500.0000 mg | ORAL_TABLET | Freq: Two times a day (BID) | ORAL | 0 refills | Status: DC

## 2018-04-30 NOTE — Telephone Encounter (Signed)
Called pt and informed her of test result showing +BV. The condition was explained in detail and she was informed that this condition is not a STI. Rx has been sent to her pharmacy.  Pt voiced understanding of information and medication instructions given.

## 2018-04-30 NOTE — Addendum Note (Signed)
Addended by: Currie Paris on: 04/30/2018 01:25 PM   Modules accepted: Orders

## 2018-04-30 NOTE — Progress Notes (Signed)
Client has BV.  Medication sent to her pharmacy and message sent to staff to call her.  Nolene Bernheim, RN, MSN, NP-BC Nurse Practitioner, [facility] for [employer], [facility] 04/30/2018 1:19 PM

## 2018-07-30 DIAGNOSIS — F329 Major depressive disorder, single episode, unspecified: Secondary | ICD-10-CM | POA: Diagnosis not present

## 2018-09-03 DIAGNOSIS — Z1331 Encounter for screening for depression: Secondary | ICD-10-CM | POA: Diagnosis not present

## 2018-09-03 DIAGNOSIS — R51 Headache: Secondary | ICD-10-CM | POA: Diagnosis not present

## 2018-09-03 DIAGNOSIS — F329 Major depressive disorder, single episode, unspecified: Secondary | ICD-10-CM | POA: Diagnosis not present

## 2018-10-01 DIAGNOSIS — R51 Headache: Secondary | ICD-10-CM | POA: Diagnosis not present

## 2018-10-01 DIAGNOSIS — F329 Major depressive disorder, single episode, unspecified: Secondary | ICD-10-CM | POA: Diagnosis not present

## 2018-10-02 DIAGNOSIS — S81812A Laceration without foreign body, left lower leg, initial encounter: Secondary | ICD-10-CM | POA: Diagnosis not present

## 2018-10-09 DIAGNOSIS — S81812A Laceration without foreign body, left lower leg, initial encounter: Secondary | ICD-10-CM | POA: Diagnosis not present

## 2018-10-09 DIAGNOSIS — Z6822 Body mass index (BMI) 22.0-22.9, adult: Secondary | ICD-10-CM | POA: Diagnosis not present

## 2018-10-09 DIAGNOSIS — R5383 Other fatigue: Secondary | ICD-10-CM | POA: Diagnosis not present

## 2018-11-11 DIAGNOSIS — H6091 Unspecified otitis externa, right ear: Secondary | ICD-10-CM | POA: Diagnosis not present

## 2018-12-20 DIAGNOSIS — F329 Major depressive disorder, single episode, unspecified: Secondary | ICD-10-CM | POA: Diagnosis not present

## 2019-01-02 DIAGNOSIS — R519 Headache, unspecified: Secondary | ICD-10-CM | POA: Diagnosis not present

## 2019-01-27 DIAGNOSIS — F4311 Post-traumatic stress disorder, acute: Secondary | ICD-10-CM | POA: Diagnosis not present

## 2019-02-03 DIAGNOSIS — F4311 Post-traumatic stress disorder, acute: Secondary | ICD-10-CM | POA: Diagnosis not present

## 2019-02-10 DIAGNOSIS — F4311 Post-traumatic stress disorder, acute: Secondary | ICD-10-CM | POA: Diagnosis not present

## 2019-02-18 DIAGNOSIS — F4311 Post-traumatic stress disorder, acute: Secondary | ICD-10-CM | POA: Diagnosis not present

## 2019-02-25 DIAGNOSIS — F4311 Post-traumatic stress disorder, acute: Secondary | ICD-10-CM | POA: Diagnosis not present

## 2019-02-26 DIAGNOSIS — R079 Chest pain, unspecified: Secondary | ICD-10-CM | POA: Diagnosis not present

## 2019-02-26 DIAGNOSIS — Z5321 Procedure and treatment not carried out due to patient leaving prior to being seen by health care provider: Secondary | ICD-10-CM | POA: Diagnosis not present

## 2019-02-27 DIAGNOSIS — F4311 Post-traumatic stress disorder, acute: Secondary | ICD-10-CM | POA: Diagnosis not present

## 2019-02-28 DIAGNOSIS — R079 Chest pain, unspecified: Secondary | ICD-10-CM | POA: Diagnosis not present

## 2019-02-28 DIAGNOSIS — F3181 Bipolar II disorder: Secondary | ICD-10-CM | POA: Diagnosis not present

## 2019-03-03 DIAGNOSIS — F4311 Post-traumatic stress disorder, acute: Secondary | ICD-10-CM | POA: Diagnosis not present

## 2019-03-10 DIAGNOSIS — F4311 Post-traumatic stress disorder, acute: Secondary | ICD-10-CM | POA: Diagnosis not present

## 2019-03-17 DIAGNOSIS — F4311 Post-traumatic stress disorder, acute: Secondary | ICD-10-CM | POA: Diagnosis not present

## 2019-03-24 DIAGNOSIS — F4311 Post-traumatic stress disorder, acute: Secondary | ICD-10-CM | POA: Diagnosis not present

## 2019-03-31 DIAGNOSIS — F4311 Post-traumatic stress disorder, acute: Secondary | ICD-10-CM | POA: Diagnosis not present

## 2019-04-07 DIAGNOSIS — F4311 Post-traumatic stress disorder, acute: Secondary | ICD-10-CM | POA: Diagnosis not present

## 2019-04-08 DIAGNOSIS — F3181 Bipolar II disorder: Secondary | ICD-10-CM | POA: Diagnosis not present

## 2019-04-21 DIAGNOSIS — F4311 Post-traumatic stress disorder, acute: Secondary | ICD-10-CM | POA: Diagnosis not present

## 2019-04-30 DIAGNOSIS — F4311 Post-traumatic stress disorder, acute: Secondary | ICD-10-CM | POA: Diagnosis not present

## 2019-05-05 DIAGNOSIS — F4311 Post-traumatic stress disorder, acute: Secondary | ICD-10-CM | POA: Diagnosis not present

## 2019-05-10 DIAGNOSIS — F3181 Bipolar II disorder: Secondary | ICD-10-CM | POA: Diagnosis not present

## 2019-05-16 DIAGNOSIS — F4311 Post-traumatic stress disorder, acute: Secondary | ICD-10-CM | POA: Diagnosis not present

## 2019-05-19 DIAGNOSIS — F4311 Post-traumatic stress disorder, acute: Secondary | ICD-10-CM | POA: Diagnosis not present

## 2019-05-27 DIAGNOSIS — F4311 Post-traumatic stress disorder, acute: Secondary | ICD-10-CM | POA: Diagnosis not present

## 2019-06-02 DIAGNOSIS — F4311 Post-traumatic stress disorder, acute: Secondary | ICD-10-CM | POA: Diagnosis not present

## 2019-06-10 DIAGNOSIS — F4311 Post-traumatic stress disorder, acute: Secondary | ICD-10-CM | POA: Diagnosis not present

## 2019-06-23 DIAGNOSIS — F4311 Post-traumatic stress disorder, acute: Secondary | ICD-10-CM | POA: Diagnosis not present

## 2019-06-30 DIAGNOSIS — F4311 Post-traumatic stress disorder, acute: Secondary | ICD-10-CM | POA: Diagnosis not present

## 2019-07-07 DIAGNOSIS — F4311 Post-traumatic stress disorder, acute: Secondary | ICD-10-CM | POA: Diagnosis not present

## 2019-07-21 DIAGNOSIS — F3181 Bipolar II disorder: Secondary | ICD-10-CM | POA: Diagnosis not present

## 2019-07-28 DIAGNOSIS — F3181 Bipolar II disorder: Secondary | ICD-10-CM | POA: Diagnosis not present

## 2019-08-04 DIAGNOSIS — F3181 Bipolar II disorder: Secondary | ICD-10-CM | POA: Diagnosis not present

## 2019-08-11 DIAGNOSIS — F3181 Bipolar II disorder: Secondary | ICD-10-CM | POA: Diagnosis not present

## 2019-08-18 DIAGNOSIS — F3181 Bipolar II disorder: Secondary | ICD-10-CM | POA: Diagnosis not present

## 2019-09-03 ENCOUNTER — Telehealth: Payer: Self-pay | Admitting: Student

## 2019-09-03 NOTE — Telephone Encounter (Signed)
Patient requesting a call back from the nurse. She has some complaints about her IUD may be misplaced. She wants to get an ultrasound to see where it is.

## 2019-09-04 DIAGNOSIS — F3181 Bipolar II disorder: Secondary | ICD-10-CM | POA: Diagnosis not present

## 2019-09-08 DIAGNOSIS — F3181 Bipolar II disorder: Secondary | ICD-10-CM | POA: Diagnosis not present

## 2019-09-12 NOTE — Telephone Encounter (Signed)
Per chart review, patient has an appt scheduled 7/27. Called patient, no answer- left message stating we were calling to check in on her and see if there is anything we can do or questions she has prior to her appt on 7/27.

## 2019-09-15 DIAGNOSIS — F3181 Bipolar II disorder: Secondary | ICD-10-CM | POA: Diagnosis not present

## 2019-09-16 ENCOUNTER — Ambulatory Visit: Payer: Medicaid Other | Admitting: Student

## 2019-09-22 DIAGNOSIS — F3181 Bipolar II disorder: Secondary | ICD-10-CM | POA: Diagnosis not present

## 2019-09-29 DIAGNOSIS — F3181 Bipolar II disorder: Secondary | ICD-10-CM | POA: Diagnosis not present

## 2019-10-08 DIAGNOSIS — F3181 Bipolar II disorder: Secondary | ICD-10-CM | POA: Diagnosis not present

## 2019-10-13 DIAGNOSIS — F3181 Bipolar II disorder: Secondary | ICD-10-CM | POA: Diagnosis not present

## 2019-10-20 DIAGNOSIS — F3181 Bipolar II disorder: Secondary | ICD-10-CM | POA: Diagnosis not present

## 2019-10-27 DIAGNOSIS — F3181 Bipolar II disorder: Secondary | ICD-10-CM | POA: Diagnosis not present

## 2019-11-04 DIAGNOSIS — F3181 Bipolar II disorder: Secondary | ICD-10-CM | POA: Diagnosis not present

## 2019-11-14 DIAGNOSIS — F3181 Bipolar II disorder: Secondary | ICD-10-CM | POA: Diagnosis not present

## 2019-11-18 DIAGNOSIS — F3181 Bipolar II disorder: Secondary | ICD-10-CM | POA: Diagnosis not present

## 2019-11-27 DIAGNOSIS — F3181 Bipolar II disorder: Secondary | ICD-10-CM | POA: Diagnosis not present
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
## 2019-12-04 DIAGNOSIS — F3181 Bipolar II disorder: Secondary | ICD-10-CM | POA: Diagnosis not present

## 2019-12-09 ENCOUNTER — Telehealth (INDEPENDENT_AMBULATORY_CARE_PROVIDER_SITE_OTHER): Payer: Medicaid Other | Admitting: Psychiatry

## 2019-12-09 ENCOUNTER — Encounter (HOSPITAL_COMMUNITY): Payer: Self-pay | Admitting: Psychiatry

## 2019-12-09 ENCOUNTER — Other Ambulatory Visit: Payer: Self-pay

## 2019-12-09 DIAGNOSIS — F330 Major depressive disorder, recurrent, mild: Secondary | ICD-10-CM | POA: Diagnosis not present

## 2019-12-09 MED ORDER — VENLAFAXINE HCL ER 75 MG PO CP24: 75.0000 mg | ORAL_CAPSULE | Freq: Every day | ORAL | 0 refills | Status: DC

## 2019-12-09 MED ORDER — VENLAFAXINE HCL ER 150 MG PO CP24: ORAL_CAPSULE | ORAL | 1 refills | Status: DC

## 2019-12-11 DIAGNOSIS — F331 Major depressive disorder, recurrent, moderate: Secondary | ICD-10-CM | POA: Diagnosis not present

## 2019-12-18 DIAGNOSIS — F331 Major depressive disorder, recurrent, moderate: Secondary | ICD-10-CM | POA: Diagnosis not present

## 2020-01-01 DIAGNOSIS — F331 Major depressive disorder, recurrent, moderate: Secondary | ICD-10-CM | POA: Diagnosis not present

## 2020-01-08 DIAGNOSIS — F331 Major depressive disorder, recurrent, moderate: Secondary | ICD-10-CM | POA: Diagnosis not present
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
## 2020-01-20 ENCOUNTER — Telehealth (INDEPENDENT_AMBULATORY_CARE_PROVIDER_SITE_OTHER): Payer: Medicaid Other | Admitting: Psychiatry

## 2020-01-20 ENCOUNTER — Encounter: Payer: Self-pay | Admitting: Psychiatry

## 2020-01-20 ENCOUNTER — Other Ambulatory Visit: Payer: Self-pay

## 2020-01-20 ENCOUNTER — Telehealth (HOSPITAL_COMMUNITY): Payer: Medicaid Other | Admitting: Psychiatry

## 2020-01-20 DIAGNOSIS — G4700 Insomnia, unspecified: Secondary | ICD-10-CM | POA: Diagnosis not present

## 2020-01-20 DIAGNOSIS — F330 Major depressive disorder, recurrent, mild: Secondary | ICD-10-CM | POA: Diagnosis not present

## 2020-01-20 MED ORDER — VENLAFAXINE HCL ER 150 MG PO CP24: ORAL_CAPSULE | ORAL | 0 refills | Status: DC

## 2020-01-20 MED ORDER — VENLAFAXINE HCL ER 75 MG PO CP24: ORAL_CAPSULE | ORAL | 1 refills | Status: DC

## 2020-01-20 NOTE — Addendum Note (Signed)
Addended by: [name] on: 01/20/2020 11:00 AM   Modules accepted: Orders

## 2020-01-22 DIAGNOSIS — F331 Major depressive disorder, recurrent, moderate: Secondary | ICD-10-CM | POA: Diagnosis not present

## 2020-01-26 ENCOUNTER — Other Ambulatory Visit (HOSPITAL_COMMUNITY): Payer: Self-pay | Admitting: Psychiatry

## 2020-01-26 DIAGNOSIS — F330 Major depressive disorder, recurrent, mild: Secondary | ICD-10-CM | POA: Diagnosis not present

## 2020-01-26 LAB — CBC
Hemoglobin: 15.4 g/dL (ref 11.7–15.5)
MCV: 89.1 fL (ref 80.0–100.0)
MPV: 10.3 fL (ref 7.5–12.5)

## 2020-01-27 ENCOUNTER — Telehealth: Payer: Self-pay | Admitting: Family Medicine

## 2020-01-27 LAB — CBC
HCT: 44.1 % (ref 35.0–45.0)
MCH: 31.1 pg (ref 27.0–33.0)
MCHC: 34.9 g/dL (ref 32.0–36.0)
Platelets: 316 10*3/uL (ref 140–400)
RBC: 4.95 10*6/uL (ref 3.80–5.10)
RDW: 12.2 % (ref 11.0–15.0)
WBC: 10 10*3/uL (ref 3.8–10.8)

## 2020-01-27 LAB — TSH: TSH: 1.34 mIU/L

## 2020-01-27 NOTE — Telephone Encounter (Signed)
Problems with IUD, been bleeding and cramping

## 2020-01-27 NOTE — Telephone Encounter (Signed)
I called Catherine Villarreal and she reports since she had IUD placed 07/2017 she has been having regular periods and had last period week of Thanksgiving and it was normal. States over the last few days she has been having menstrual cramps and intermittent sharp pains more in evening =6,7. States she has had spotting with brown blood and then last night fresh blood.  She states this is not normal for her. She has done 2 home pregnancy tests which were negative.  Discussed with Dr. Crissie Reese and he advises she be evaluated this week and has opening on Friday. I sent message to registrar to schedule and notify patient.  I informed Catherine Villarreal provider recommends she be evaluated this week and registrar will contact her with appt. I also informed her Dr. Crissie Reese recommends if she begins to have worsening pain, severe abdominal pain, nausea vomiting,e tc to go to hospital for evaluation for IUD migration. She voices understanding.  Castulo Scarpelli,RN

## 2020-01-28 DIAGNOSIS — R109 Unspecified abdominal pain: Secondary | ICD-10-CM | POA: Diagnosis not present

## 2020-01-28 DIAGNOSIS — R1084 Generalized abdominal pain: Secondary | ICD-10-CM | POA: Diagnosis not present

## 2020-01-28 DIAGNOSIS — K5900 Constipation, unspecified: Secondary | ICD-10-CM | POA: Diagnosis not present

## 2020-01-28 DIAGNOSIS — N3001 Acute cystitis with hematuria: Secondary | ICD-10-CM | POA: Diagnosis not present

## 2020-01-28 DIAGNOSIS — N200 Calculus of kidney: Secondary | ICD-10-CM | POA: Diagnosis not present

## 2020-01-28 DIAGNOSIS — R1030 Lower abdominal pain, unspecified: Secondary | ICD-10-CM | POA: Diagnosis not present

## 2020-01-29 ENCOUNTER — Encounter: Payer: Self-pay | Admitting: Psychiatry

## 2020-01-29 DIAGNOSIS — F331 Major depressive disorder, recurrent, moderate: Secondary | ICD-10-CM | POA: Diagnosis not present

## 2020-01-30 ENCOUNTER — Encounter: Payer: Self-pay | Admitting: Family Medicine

## 2020-01-30 ENCOUNTER — Other Ambulatory Visit: Payer: Self-pay

## 2020-01-30 ENCOUNTER — Other Ambulatory Visit (HOSPITAL_COMMUNITY)
Admission: RE | Admit: 2020-01-30 | Discharge: 2020-01-30 | Disposition: A | Discharge status: Home / Self Care | Payer: Medicaid Other | Source: Ambulatory Visit | Attending: Family Medicine | Admitting: Family Medicine

## 2020-01-30 ENCOUNTER — Ambulatory Visit (INDEPENDENT_AMBULATORY_CARE_PROVIDER_SITE_OTHER): Payer: Medicaid Other | Admitting: Family Medicine

## 2020-01-30 VITALS — BP 125/89 | HR 86 | Wt 173.0 lb

## 2020-01-30 DIAGNOSIS — Z803 Family history of malignant neoplasm of breast: Secondary | ICD-10-CM | POA: Insufficient documentation

## 2020-01-30 DIAGNOSIS — Z01419 Encounter for gynecological examination (general) (routine) without abnormal findings: Secondary | ICD-10-CM | POA: Diagnosis not present

## 2020-01-30 DIAGNOSIS — N920 Excessive and frequent menstruation with regular cycle: Secondary | ICD-10-CM

## 2020-01-30 DIAGNOSIS — Z975 Presence of (intrauterine) contraceptive device: Secondary | ICD-10-CM

## 2020-01-30 DIAGNOSIS — Z113 Encounter for screening for infections with a predominantly sexual mode of transmission: Secondary | ICD-10-CM | POA: Diagnosis not present

## 2020-01-30 MED ORDER — NAPROXEN SODIUM 550 MG PO TABS: 550.0000 mg | ORAL_TABLET | Freq: Two times a day (BID) | ORAL | 2 refills | Status: DC

## 2020-01-30 NOTE — Assessment & Plan Note (Signed)
Recommended to start mammograms at age 29 (mother diagnosed at age 62), no concerns on clinical breast exam.

## 2020-01-30 NOTE — Assessment & Plan Note (Signed)
Patient here to discuss IUD removal. On review of symptoms and recent lab/imaging work, not totally clear to me that abdominal pain has been related to IUD necessarily, may be secondary to nephrolithiasis. Reassured patient no signs of IUD malpositioning on CT or clinical exam. Also does not want more children, and given lag in time for vasectomy to be fully effective recommended waiting on IUD removal until that time. After discussion with her partner she is in agreement with this plan. For menorrhagia will trial naproxen, also obtained STI screening at patient's request.

## 2020-01-30 NOTE — Progress Notes (Signed)
GYNECOLOGY OFFICE VISIT NOTE  History:   Catherine Villarreal is a 29 y.o. G2P1011 here today for ED follow up.  Seen in the ED on 01/28/2020 in [place] Per chart, at that time she had been directed to the ED by her OB/GYN for issues with IUD, abdominal cramping, and sharp pain in her lower abdomen intermittently Labs notable for neg pregnancy test, neg wet prep, benign CBC/CMP/lipase CT A/P was performed which showed 11mm nonobstructing L kidney stone, read otherwise unremarkable  Today reports cramping since Saturday Also having some vaginal bleeding for past 5 days, mostly spotting at first but overall less than a period Would like IUD removed for these reasons Has paragard IUD Partner thinking about getting a vasectomy Does not want more children  Also wants a mammogram Mother had breast cancer at 64 General tenderness but no lumps or other concerning findings No nipple discharge  Past Medical History:  Diagnosis Date  . Gallstones   . Migraine     Past Surgical History:  Procedure Laterality Date  . CHOLECYSTECTOMY      The following portions of the patient's history were reviewed and updated as appropriate: allergies, current medications, past family history, past medical history, past social history, past surgical history and problem list.   Health Maintenance:  Normal pap: 08/17/2017.  Normal mammogram: n/a.   Review of Systems:  Pertinent items noted in HPI and remainder of comprehensive ROS otherwise negative.  Physical Exam:  BP 125/89   Pulse 86   Wt 173 lb (78.5 kg)   BMI 27.10 kg/m  CONSTITUTIONAL: Well-developed, well-nourished female in no acute distress.  HEENT:  Normocephalic, atraumatic. External right and left ear normal. No scleral icterus.  NECK: Normal range of motion, supple, no masses noted on observation BREAST: unremarkable bilateral breast exam without adenopathy or nipple discharge SKIN: No rash noted. Not diaphoretic. No erythema. No  pallor. MUSCULOSKELETAL: Normal range of motion. No edema noted. NEUROLOGIC: Alert and oriented to person, place, and time. Normal muscle tone coordination.  PSYCHIATRIC: Normal mood and affect. Normal behavior. Normal judgment and thought content. RESPIRATORY: Effort normal, no problems with respiration noted ABDOMEN: No masses noted. No other overt distention noted.   PELVIC: normal external genitalia, small amount of blood in vault without active bleeding, cervix unremarkable with strings protruding and at same length (3cm) as noted in insertion note  Labs and Imaging No results found for this or any previous visit (from the past 168 hour(s)). No results found.    Assessment and Plan:   Problem List Items Addressed This Visit      Other   Presence of IUD    Patient here to discuss IUD removal. On review of symptoms and recent lab/imaging work, not totally clear to me that abdominal pain has been related to IUD necessarily, may be secondary to nephrolithiasis. Reassured patient no signs of IUD malpositioning on CT or clinical exam. Also does not want more children, and given lag in time for vasectomy to be fully effective recommended waiting on IUD removal until that time. After discussion with her partner she is in agreement with this plan. For menorrhagia will trial naproxen, also obtained STI screening at patient's request.       Family history of breast cancer    Recommended to start mammograms at age 60 (mother diagnosed at age 58), no concerns on clinical breast exam.        Other Visit Diagnoses    Well woman exam with  routine gynecological exam    -  Primary   Screening examination for STD (sexually transmitted disease)       Relevant Orders   HIV Antibody (routine testing w rflx)   Hepatitis C Antibody   Hepatitis B Surface AntiGEN   RPR   Cervicovaginal ancillary only( [place])   Menorrhagia with regular cycle       Relevant Medications   naproxen sodium (ANAPROX  DS) 550 MG tablet      Routine preventative health maintenance measures emphasized. Please refer to After Visit Summary for other counseling recommendations.   Return if symptoms worsen or fail to improve.    Total face-to-face time with patient: 25 minutes.  Over 50% of encounter was spent on counseling and coordination of care.   Venora Maples, MD/[facility] [employer], [facility]

## 2020-01-31 LAB — RPR: RPR Ser Ql: NONREACTIVE

## 2020-01-31 LAB — HIV ANTIBODY (ROUTINE TESTING W REFLEX): HIV Screen 4th Generation wRfx: NONREACTIVE

## 2020-01-31 LAB — HEPATITIS C ANTIBODY: Hep C Virus Ab: <0.1 s/co ratio (ref 0.0–0.9)

## 2020-01-31 LAB — HEPATITIS B SURFACE ANTIGEN: Hepatitis B Surface Ag: NEGATIVE

## 2020-02-02 ENCOUNTER — Telehealth: Payer: Self-pay | Admitting: Family Medicine

## 2020-02-02 LAB — CERVICOVAGINAL ANCILLARY ONLY
Chlamydia: NEGATIVE
Comment: NEGATIVE
Comment: NEGATIVE
Comment: NORMAL
Neisseria Gonorrhea: NEGATIVE
Trichomonas: NEGATIVE

## 2020-02-02 NOTE — Telephone Encounter (Signed)
Patient without active mychart, please call and let her know that all blood tests and swabs came back normal.

## 2020-02-03 NOTE — Telephone Encounter (Signed)
Attempted to call patient and she did not answer. LM for her to call the office to follow up in our conversation from this morning.

## 2020-02-03 NOTE — Telephone Encounter (Signed)
Patient returned call and asked for nurse to call back.   Called patient back and gave information that Dr. Crissie Reese relayed in regards to patients concerns.   Patient reports the Naproxen PA has not been approved and that she is taking OTC and can take 450 mg. She has been taking Friday. She has not noticed a big difference since she started taking, she was bleeding more on Saturday and Sunday and has not returned to spotting.

## 2020-02-03 NOTE — Telephone Encounter (Signed)
Called patient to inform her of lab results.  Patient is concerned she is still having bleeding. She has found out she has a kidney stone. The bleeding she is noticing is like a period. She has an appointment on Monday 12/20.   She thinks the bleeding is coming from her vagina, it is like a lighter period. The bleeding has been occurring 3/4 weeks. She reports she is not pregnant.   She has an IUD since 2019 and this bleeding is a new problem.   Patient voiced she is still very concerned about the bleeding. Will route message to Dr. Crissie Reese for advice.

## 2020-02-03 NOTE — Telephone Encounter (Signed)
She should trial the naproxen we discussed at her visit. If that doesn't work I'd recommend OCP's or other hormonal methods for control of her bleeding but she has a strong desire to avoid hormonal therapies due to prior bad experiences. Otherwise her only other option would be removing the IUD and seeing how her symptoms go, though we discussed that at length at her visit and came to a shared decision that it wasn't the best plan for her right now.

## 2020-02-04 NOTE — Telephone Encounter (Signed)
Haven't seen a PA come in for me that I know of. OTC is totally fine, I'd give it a full two weeks before we call it a failure, and at that point we're back to the other options of OCPs or removing her IUD.

## 2020-02-05 DIAGNOSIS — F331 Major depressive disorder, recurrent, moderate: Secondary | ICD-10-CM | POA: Diagnosis not present

## 2020-02-05 NOTE — Telephone Encounter (Signed)
Returned call to patient, Patient did not answer. LM for her to call the office for recommendations.

## 2020-02-05 NOTE — Telephone Encounter (Signed)
Called patient and informed her that Provider is fine with her taking the OTC Naproxen at 450 mg and to give it 2 weeks before calling it a failure. At that time, if still bleeding, she will need to come back to discuss OCP's or removal of IUD. Patient voiced understanding.

## 2020-02-09 DIAGNOSIS — N200 Calculus of kidney: Secondary | ICD-10-CM | POA: Diagnosis not present

## 2020-02-09 DIAGNOSIS — R109 Unspecified abdominal pain: Secondary | ICD-10-CM | POA: Diagnosis not present

## 2020-02-10 ENCOUNTER — Ambulatory Visit: Payer: Medicaid Other | Admitting: Advanced Practice Midwife
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
## 2020-02-24 ENCOUNTER — Other Ambulatory Visit: Payer: Self-pay

## 2020-02-24 ENCOUNTER — Telehealth (INDEPENDENT_AMBULATORY_CARE_PROVIDER_SITE_OTHER): Payer: Medicaid Other | Admitting: Psychiatry

## 2020-02-24 ENCOUNTER — Encounter: Payer: Self-pay | Admitting: Psychiatry

## 2020-02-24 DIAGNOSIS — G4700 Insomnia, unspecified: Secondary | ICD-10-CM

## 2020-02-24 DIAGNOSIS — F330 Major depressive disorder, recurrent, mild: Secondary | ICD-10-CM | POA: Diagnosis not present

## 2020-02-24 MED ORDER — VENLAFAXINE HCL ER 150 MG PO CP24: ORAL_CAPSULE | ORAL | 0 refills | Status: DC

## 2020-02-24 MED ORDER — VENLAFAXINE HCL ER 75 MG PO CP24: ORAL_CAPSULE | ORAL | 0 refills | Status: DC

## 2020-02-26 ENCOUNTER — Encounter: Payer: Self-pay | Admitting: Neurology

## 2020-02-26 ENCOUNTER — Ambulatory Visit: Payer: Medicaid Other | Admitting: Neurology

## 2020-02-26 VITALS — BP 115/77 | HR 85 | Ht 67.0 in | Wt 174.0 lb

## 2020-02-26 DIAGNOSIS — R519 Headache, unspecified: Secondary | ICD-10-CM | POA: Diagnosis not present

## 2020-02-26 DIAGNOSIS — E663 Overweight: Secondary | ICD-10-CM | POA: Diagnosis not present

## 2020-02-26 DIAGNOSIS — G4719 Other hypersomnia: Secondary | ICD-10-CM

## 2020-02-26 DIAGNOSIS — R0683 Snoring: Secondary | ICD-10-CM

## 2020-02-26 NOTE — Patient Instructions (Signed)

## 2020-02-26 NOTE — Progress Notes (Signed)
Subjective:    Patient ID: Catherine Villarreal is a 30 y.o. female.  HPI     Huston Foley, MD, PhD [facility] [address], [address] [address] [place], [place] [address]  Dear Dr. Vanetta Shawl,   I saw your patient, Catherine Villarreal, upon your kind request, in my Sleep clinic today for initial consultation of her sleep disorder, in particular, for daytime somnolence and fatigue.  The patient is not sure how long this has been going on, probably for years.  The patient is unaccompanied today.  As you know, Catherine Villarreal is a 30 year old right-handed woman with an underlying medical history of gallstones, migraine headache, depression and mildly overweight state, who reports daytime somnolence, most noticeable after she had her son who is now 4.  I reviewed your video visit note from 01/20/2020.  Her Epworth sleepiness score is 13 out of 24, fatigue severity score is 42 out of 63.  She lives with her fianc, her 39-year-old son, her mother-in-law, and her fianc has 3 children stay with them from time to time, ages 30, 45 and 41-year-old.  She does not always recall dreaming.  Sometimes she has vivid dreams.  She does not have any sleep paralysis or hypnagogic or hypnopompic hallucinations, does not endorse any telltale cataplexy.  She has had occasional morning headaches, she snores occasionally.  She does have twitching at times during her sleep.  She has been on Effexor for the past several months.  She is currently on 225 mg daily.  She goes to bed somewhere between 7 and 11, rise time is typically between 4 and 6 but she often will go back to bed after getting the kids ready for school.  She will also take an afternoon nap if possible.  She does not typically dream in her naps.  She is not currently working.  She smokes about three quarters of a pack per day.  She would like to quit eventually.  She does not currently drink any alcohol since October 2021.  She drinks caffeine in the form of  coffee, about 2 cups/day and at least 1 or 1-1/2 energy drinks per day.  She does not have night to night nocturia.  Her Past Medical History Is Significant For: Past Medical History:  Diagnosis Date  . Anxiety and depression   . Chronic headaches   . Gallstones   . Kidney stones   . Migraine     Her Past Surgical History Is Significant For: Past Surgical History:  Procedure Laterality Date  . CHOLECYSTECTOMY  2018    Her Family History Is Significant For: Family History  Problem Relation Age of Onset  . Cancer Mother   . Breast cancer Mother   . Thyroid cancer Mother   . Other Mother   . Drug abuse Brother   . Stroke Father     Her Social History Is Significant For: Social History   Socioeconomic History  . Marital status: Significant Other    Spouse name: [name]  . Number of children: 1  . Years of education: Not on file  . Highest education level: Some college, no degree  Occupational History    Comment: na  Tobacco Use  . Smoking status: Current Every Day Smoker    Packs/day: 0.75    Types: Cigarettes  . Smokeless tobacco: Never Used  Vaping Use  . Vaping Use: Never used  Substance and Sexual Activity  . Alcohol use: Not Currently    Comment:  quit Oct 2021  . Drug use: Never  . Sexual activity: Yes    Birth control/protection: I.U.D.  Other Topics Concern  . Not on file  Social History Narrative   Lives with mother, child, mother in law   Caffeine- coffee, energy drink daily   Social Determinants of Health   Financial Resource Strain: Not on file  Food Insecurity: No Food Insecurity  . Worried About Programme researcher, broadcasting/film/video in the Last Year: Never true  . Ran Out of Food in the Last Year: Never true  Transportation Needs: No Transportation Needs  . Lack of Transportation (Medical): No  . Lack of Transportation (Non-Medical): No  Physical Activity: Not on file  Stress: Not on file  Social Connections: Not on file    Her Allergies Are:   Allergies  Allergen Reactions  . Dilaudid [Hydromorphone Hcl]     migraine  . Adhesive [Tape] Rash  :   Her Current Medications Are:  Outpatient Encounter Medications as of 02/26/2020  Medication Sig  . Docusate Sodium (DSS) 100 MG CAPS Take by mouth.  . meloxicam (MOBIC) 7.5 MG tablet Take 7.5 mg by mouth daily.  . naproxen sodium (ANAPROX DS) 550 MG tablet Take 1 tablet (550 mg total) by mouth 2 (two) times daily with a meal.  . [START ON 03/21/2020] venlafaxine XR (EFFEXOR XR) 75 MG 24 hr capsule 225 mg daily (take along with 150 mg cap)  . [START ON 03/07/2020] venlafaxine XR (EFFEXOR-XR) 150 MG 24 hr capsule 225 mg daily. Take along with 75 mg cap  . ondansetron (ZOFRAN-ODT) 4 MG disintegrating tablet Take by mouth. (Patient not taking: Reported on 02/26/2020)   No facility-administered encounter medications on file as of 02/26/2020.  :  Review of Systems:  Out of a complete 14 point review of systems, all are reviewed and negative with the exception of these symptoms as listed below: Review of Systems  Neurological:       Rm 1 New pt here for  Sleep consult  Epworth Sleepiness Scale 0= would never doze 1= slight chance of dozing 2= moderate chance of dozing 3= high chance of dozing  Sitting and reading:1 Watching TV:2 Sitting inactive in a public place (ex. Theater or meeting):1 As a passenger in a car for an hour without a break:3 Lying down to rest in the afternoon:3 Sitting and talking to someone:0 Sitting quietly after lunch (no alcohol):3 In a car, while stopped in traffic:0 Total:13    Objective:  Neurological Exam  Physical Exam Physical Examination:   Vitals:   02/26/20 1302  BP: 115/77  Pulse: 85    General Examination: The patient is a very pleasant 30 y.o. female in no acute distress. She appears well-developed and well-nourished and well groomed.   HEENT: Normocephalic, atraumatic, pupils are equal, round and reactive to light, extraocular tracking  is good without limitation to gaze excursion or nystagmus noted. Hearing is grossly intact. Face is symmetric with normal facial animation. Speech is clear with no dysarthria noted. There is no hypophonia. There is no lip, neck/head, jaw or voice tremor. Neck is supple with full range of passive and active motion. There are no carotid bruits on auscultation. Oropharynx exam reveals: mild mouth dryness, adequate dental hygiene and mild airway crowding, due to Small airway entry, tonsils on the smaller side, Mallampati class I.  Neck circumference 13 3/8 inches.  Tongue protrudes centrally and palate elevates symmetrically.   Chest: Clear to auscultation without wheezing, rhonchi or  crackles noted.  Heart: S1+S2+0, regular and normal without murmurs, rubs or gallops noted.   Abdomen: Soft, non-tender and non-distended with normal bowel sounds appreciated on auscultation.  Extremities: There is no pitting edema in the distal lower extremities bilaterally.   Skin: Warm and dry without trophic changes noted.   Musculoskeletal: exam reveals no obvious joint deformities, tenderness or joint swelling or erythema.   Neurologically:  Mental status: The patient is awake, alert and oriented in all 4 spheres. Her immediate and remote memory, attention, language skills and fund of knowledge are appropriate. There is no evidence of aphasia, agnosia, apraxia or anomia. Speech is clear with normal prosody and enunciation. Thought process is linear. Mood is normal and affect is normal.  Cranial nerves II - XII are as described above under HEENT exam.  Motor exam: Normal bulk, strength and tone is noted. There is no tremor, Romberg is negative. Fine motor skills and coordination: grossly intact.  Cerebellar testing: No dysmetria or intention tremor. There is no truncal or gait ataxia.  Sensory exam: intact to light touch in the upper and lower extremities.  Gait, station and balance: She stands easily. No veering  to one side is noted. No leaning to one side is noted. Posture is age-appropriate and stance is narrow based. Gait shows normal stride length and normal pace. No problems turning are noted. Tandem walk is unremarkable.                Assessment and Plan:  In summary, PIYA MESCH is a very pleasant 30 y.o.-year old female with an underlying medical history of gallstones, migraine headache, depression and mildly overweight state, who presents for evaluation of her sleep disturbance.  She endorses daytime somnolence.  She does not have a history telltale for narcolepsy. Underlying sleep disordered breathing is not excluded.  We mutually agreed to pursue a nocturnal polysomnogram.  Extended sleep testing with a nap study and an overnight polysomnogram preceding the nap study can also be considered but would require tapering her Effexor.  She is agreeable to proceed with a nocturnal sleep study for now.  If she has obstructive sleep apnea, she is willing to try treatment for this.  I had a long chat with the patient about my findings and the diagnosis of OSA, its prognosis and treatment options. We talked about medical treatments, surgical interventions and non-pharmacological approaches. I explained in particular the risks and ramifications of untreated moderate to severe OSA, especially with respect to developing cardiovascular disease down the Road, including congestive heart failure, difficult to treat hypertension, cardiac arrhythmias, or stroke. Even type 2 diabetes has, in part, been linked to untreated OSA. Symptoms of untreated OSA include daytime sleepiness, memory problems, mood irritability and mood disorder such as depression and anxiety, lack of energy, as well as recurrent headaches, especially morning headaches. We talked about smoking cessation and trying to maintain a healthy lifestyle in general, as well as the importance of weight control. We also talked about the importance of good sleep  hygiene. I recommended the following at this time: sleep study.  She reports that she had blood work through her primary care as well as her GYN and your office to look for underlying causes for fatigue.  I reviewed blood work that was available through her electronic epic chart. I explained the sleep test procedure to the patient and also outlined possible surgical and non-surgical treatment options of OSA, including the use of a custom-made dental device (which would  require a referral to a specialist dentist or oral surgeon), upper airway surgical options, such as traditional UPPP or a novel less invasive surgical option in the form of Inspire hypoglossal nerve stimulation (which would involve a referral to an ENT surgeon). I also explained the CPAP treatment option to the patient, who indicated that she would be willing to try CPAP if the need arises. I explained the importance of being compliant with PAP treatment, not only for insurance purposes but primarily to improve Her symptoms, and for the patient's long term health benefit, including to reduce Her cardiovascular risks. I answered all her questions today and the patient was in agreement. I plan to see her back after the sleep study is completed and encouraged her to call with any interim questions, concerns, problems or updates.   Thank you very much for allowing me to participate in the care of this nice patient. If I can be of any further assistance to you please do not hesitate to call me at [phone].  Sincerely,   Star Age, MD, PhD

## 2020-03-04 DIAGNOSIS — F331 Major depressive disorder, recurrent, moderate: Secondary | ICD-10-CM | POA: Diagnosis not present

## 2020-03-11 DIAGNOSIS — F331 Major depressive disorder, recurrent, moderate: Secondary | ICD-10-CM | POA: Diagnosis not present

## 2020-03-15 DIAGNOSIS — F3181 Bipolar II disorder: Secondary | ICD-10-CM | POA: Diagnosis not present

## 2020-03-22 DIAGNOSIS — F3181 Bipolar II disorder: Secondary | ICD-10-CM | POA: Diagnosis not present

## 2020-03-23 ENCOUNTER — Telehealth: Payer: Self-pay

## 2020-03-23 NOTE — Telephone Encounter (Signed)
Although I am not too much concerned given her recent thyroid test was normal in last Dec, I would advise her to reach out to her PCP if she has any further concerns.

## 2020-03-23 NOTE — Telephone Encounter (Signed)
pt states that she wanted you to be aware that she found out that it is a strong family hx of Graves disease and Hashimoto in her family and wonder if she has it?
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
## 2020-03-28 ENCOUNTER — Other Ambulatory Visit: Payer: Self-pay

## 2020-03-28 ENCOUNTER — Ambulatory Visit (INDEPENDENT_AMBULATORY_CARE_PROVIDER_SITE_OTHER): Payer: Medicaid Other | Admitting: Neurology

## 2020-03-28 DIAGNOSIS — G4710 Hypersomnia, unspecified: Secondary | ICD-10-CM

## 2020-03-28 DIAGNOSIS — G4720 Circadian rhythm sleep disorder, unspecified type: Secondary | ICD-10-CM

## 2020-03-28 DIAGNOSIS — G4719 Other hypersomnia: Secondary | ICD-10-CM

## 2020-03-28 DIAGNOSIS — E663 Overweight: Secondary | ICD-10-CM

## 2020-03-28 DIAGNOSIS — R519 Headache, unspecified: Secondary | ICD-10-CM

## 2020-03-28 DIAGNOSIS — R0683 Snoring: Secondary | ICD-10-CM

## 2020-03-29 ENCOUNTER — Telehealth: Payer: Self-pay

## 2020-03-29 NOTE — Telephone Encounter (Signed)
Received Referral on pt for " weight gain " possibly with her thyroid but they said they do not have recent labs on her. The last labs they have on her were from 2020. Advised her we can not schedule until we have updated labs. Referral is in Proficient under review until we get those

## 2020-03-30 ENCOUNTER — Telehealth (INDEPENDENT_AMBULATORY_CARE_PROVIDER_SITE_OTHER): Payer: Medicaid Other | Admitting: Psychiatry

## 2020-03-30 ENCOUNTER — Encounter: Payer: Self-pay | Admitting: Psychiatry

## 2020-03-30 ENCOUNTER — Other Ambulatory Visit: Payer: Self-pay

## 2020-03-30 DIAGNOSIS — R5383 Other fatigue: Secondary | ICD-10-CM | POA: Diagnosis not present

## 2020-03-30 DIAGNOSIS — G4700 Insomnia, unspecified: Secondary | ICD-10-CM

## 2020-03-30 DIAGNOSIS — F330 Major depressive disorder, recurrent, mild: Secondary | ICD-10-CM

## 2020-03-31 NOTE — Telephone Encounter (Signed)
Dr Fransico Him reviewed the referral and said that she needs to be tested for Free T4 and AM Cortisol and once those are resulted and abnormal then place a new referral

## 2020-04-01 DIAGNOSIS — F3181 Bipolar II disorder: Secondary | ICD-10-CM | POA: Diagnosis not present

## 2020-04-01 DIAGNOSIS — R5383 Other fatigue: Secondary | ICD-10-CM | POA: Diagnosis not present

## 2020-04-01 DIAGNOSIS — Z1329 Encounter for screening for other suspected endocrine disorder: Secondary | ICD-10-CM | POA: Diagnosis not present

## 2020-04-05 NOTE — Procedures (Signed)
PATIENT'S NAME:  Catherine Villarreal, Catherine Villarreal DOB:      1990/06/26      MR#:    [id number]     DATE OF RECORDING: 03/28/2020 REFERRING M.D.:  [name], MD Study Performed:   Baseline Polysomnogram HISTORY: 30 year old woman with a history of gallstones, migraine headache, depression and mildly overweight state, who reports daytime somnolence, most noticeable after she had her son, who is now 4. The patient endorsed the Epworth Sleepiness Scale at 13 points. The patient's weight 174 pounds with a height of 67 (inches), resulting in a BMI of 27.3 kg/m2. The patient's neck circumference measured 13.5 inches.  CURRENT MEDICATIONS: Docusate Sodium, Mobic, Anaprox Ds, Effexor XR, Zofran   PROCEDURE:  This is a multichannel digital polysomnogram utilizing the Somnostar 11.2 system.  Electrodes and sensors were applied and monitored per AASM Specifications.   EEG, EOG, Chin and Limb EMG, were sampled at 200 Hz.  ECG, Snore and Nasal Pressure, Thermal Airflow, Respiratory Effort, CPAP Flow and Pressure, Oximetry was sampled at 50 Hz. Digital video and audio were recorded.      BASELINE STUDY  Lights Out was at 22:09 and Lights On at 04:35.  Total recording time (TRT) was 386.5 minutes, with a total sleep time (TST) of 296 minutes.   The patient's sleep latency was 25.5 minutes.  REM latency was 73.5 minutes, which is normal. The sleep efficiency was 76.6%, which is mildly reduced.     SLEEP ARCHITECTURE: WASO (Wake after sleep onset) was 64.5 minutes with mild to moderate sleep fragmentation noted.  There were 42.5 minutes in Stage N1, 162 minutes Stage N2, 66 minutes Stage N3 and 25.5 minutes in Stage REM.  The percentage of Stage N1 was 14.4%, which is increased, Stage N2 was 54.7%, which is normal, Stage N3 was 22.3% and Stage R (REM sleep) was 8.6%, which is reduced. The arousals were noted as: 49 were spontaneous, 0 were associated with PLMs, 1 were associated with respiratory events.  RESPIRATORY ANALYSIS:  There  were a total of 1 respiratory events:  0 obstructive apneas, 0 central apneas and 0 mixed apneas with a total of 0 apneas and an apnea index (AI) of 0 /hour. There were 1 hypopneas with a hypopnea index of .2 /hour. The patient also had 0 respiratory event related arousals (RERAs).      The total APNEA/HYPOPNEA INDEX (AHI) was .2/hour and the total RESPIRATORY DISTURBANCE INDEX was  .2 /hour.  0 events occurred in REM sleep and 2 events in NREM. The REM AHI was  0 /hour, versus a non-REM AHI of .2. The patient spent 107 minutes of total sleep time in the supine position and 189 minutes in non-supine.. The supine AHI was 0.0 versus a non-supine AHI of 0.3.  OXYGEN SATURATION & C02:  The Wake baseline 02 saturation was 96%, with the lowest being 90%. Time spent below 89% saturation equaled 0 minutes.  PERIODIC LIMB MOVEMENTS: The patient had a total of 0 Periodic Limb Movements.  The Periodic Limb Movement (PLM) index was 0 and the PLM Arousal index was 0/hour.  Audio and video analysis did not show any abnormal or unusual movements, behaviors, phonations or vocalizations. The patient took no bathroom breaks. Soft, intermittent snoring was noted. The EKG was in keeping with normal sinus rhythm (NSR).  Post-study, the patient indicated that sleep was worse than usual.   IMPRESSION:  1. Dysfunctions associated with sleep stages or arousal from sleep  RECOMMENDATIONS:  1. This study does  not demonstrate any significant obstructive or central sleep disordered breathing.  2. This study shows sleep fragmentation and abnormal sleep stage percentages; these are nonspecific findings and per se do not signify an intrinsic sleep disorder or a cause for the patient's sleep-related symptoms. Causes include (but are not limited to) the first night effect of the sleep study, circadian rhythm disturbances, medication effect or an underlying mood disorder or medical problem.  3. The patient should be cautioned not  to drive, work at heights, or operate dangerous or heavy equipment when tired or sleepy. Review and reiteration of good sleep hygiene measures should be pursued with any patient. 4. The patient will be advised to follow up with the referring provider, who will be notified of the test results.  I certify that I have reviewed the entire raw data recording prior to the issuance of this report in accordance with the Standards of Accreditation of the American Academy of Sleep Medicine (AASM)   Huston Foley, MD, PhD Diplomat, American Board of Neurology and Sleep Medicine (Neurology and Sleep Medicine)

## 2020-04-05 NOTE — Progress Notes (Signed)
Patient referred by Dr. Vanetta Shawl, seen by me on 02/26/20, diagnostic PSG on 03/28/20.   Please call and notify the patient that the recent sleep study did not show any significant obstructive sleep apnea.  She did not have any significant snoring.  She did not have any significant desaturations.  She achieved all stages of sleep but did have a reduced percentage of dream sleep and some sleep disruption.  These are nonspecific findings.  We had talked about extended sleep testing but she would have to taper her Effexor which is currently fairly high dose.  We had mutually agreed to do her baseline sleep test at this time.  Her history was not telltale for narcolepsy.  At this juncture, she is advised to follow-up with her primary care as well as Dr. Vanetta Shawl as scheduled.  Please remind patient to not drive when feeling sleepy.   Thanks,  Huston Foley, MD, PhD [facility])

## 2020-04-07 ENCOUNTER — Telehealth: Payer: Self-pay

## 2020-04-07 NOTE — Telephone Encounter (Signed)
-----   Message from Huston Foley, MD sent at 04/05/2020  5:42 PM EST ----- Patient referred by Dr. Vanetta Shawl, seen by me on 02/26/20, diagnostic PSG on 03/28/20.   Please call and notify the patient that the recent sleep study did not show any significant obstructive sleep apnea.  She did not have any significant snoring.  She did not have any significant desaturations.  She achieved all stages of sleep but did have a reduced percentage of dream sleep and some sleep disruption.  These are nonspecific findings.  We had talked about extended sleep testing but she would have to taper her Effexor which is currently fairly high dose.  We had mutually agreed to do her baseline sleep test at this time.  Her history was not telltale for narcolepsy.  At this juncture, she is advised to follow-up with her primary care as well as Dr. Vanetta Shawl as scheduled.  Please remind patient to not drive when feeling sleepy.   Thanks,  Huston Foley, MD, PhD [facility])

## 2020-04-07 NOTE — Telephone Encounter (Signed)
I called pt and reveiwed sleep study results. Pt verbalized understanding and was agreeable to plan. Report has been sent to Dr. Vanetta Shawl.

## 2020-04-15 DIAGNOSIS — F331 Major depressive disorder, recurrent, moderate: Secondary | ICD-10-CM | POA: Diagnosis not present

## 2020-04-19 ENCOUNTER — Telehealth: Payer: Self-pay

## 2020-04-19 ENCOUNTER — Other Ambulatory Visit: Payer: Self-pay | Admitting: Psychiatry

## 2020-04-19 MED ORDER — VENLAFAXINE HCL ER 75 MG PO CP24: ORAL_CAPSULE | ORAL | 0 refills | Status: DC

## 2020-04-19 MED ORDER — VENLAFAXINE HCL ER 150 MG PO CP24: ORAL_CAPSULE | ORAL | 0 refills | Status: DC

## 2020-04-19 NOTE — Telephone Encounter (Signed)
Ordered

## 2020-04-19 NOTE — Telephone Encounter (Signed)
pt called states she taking her last effexor s  today. she needs both her 75 mg and the 150mg 
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
## 2020-04-30 DIAGNOSIS — F331 Major depressive disorder, recurrent, moderate: Secondary | ICD-10-CM | POA: Diagnosis not present

## 2020-05-04 ENCOUNTER — Encounter: Payer: Self-pay | Admitting: Psychiatry

## 2020-05-04 ENCOUNTER — Telehealth (INDEPENDENT_AMBULATORY_CARE_PROVIDER_SITE_OTHER): Payer: Medicaid Other | Admitting: Psychiatry

## 2020-05-04 ENCOUNTER — Other Ambulatory Visit: Payer: Self-pay

## 2020-05-04 DIAGNOSIS — F330 Major depressive disorder, recurrent, mild: Secondary | ICD-10-CM

## 2020-05-04 MED ORDER — VENLAFAXINE HCL ER 75 MG PO CP24: ORAL_CAPSULE | ORAL | 0 refills | Status: DC

## 2020-05-04 MED ORDER — VENLAFAXINE HCL ER 150 MG PO CP24: ORAL_CAPSULE | ORAL | 0 refills | Status: DC

## 2020-05-04 NOTE — Patient Instructions (Addendum)
1.Continuevenlafaxine 225 mg daily  2.Next appointment-4/19   at 11:30 3. Please contact out clinic if you are interested in TMS (Transcranial magnetic stimulation)

## 2020-05-07 DIAGNOSIS — F331 Major depressive disorder, recurrent, moderate: Secondary | ICD-10-CM | POA: Diagnosis not present

## 2020-05-13 DIAGNOSIS — N200 Calculus of kidney: Secondary | ICD-10-CM | POA: Insufficient documentation

## 2020-05-14 DIAGNOSIS — F3181 Bipolar II disorder: Secondary | ICD-10-CM | POA: Diagnosis not present

## 2020-05-14 DIAGNOSIS — F331 Major depressive disorder, recurrent, moderate: Secondary | ICD-10-CM | POA: Diagnosis not present

## 2020-05-21 DIAGNOSIS — F3181 Bipolar II disorder: Secondary | ICD-10-CM | POA: Diagnosis not present

## 2020-05-25 ENCOUNTER — Ambulatory Visit (INDEPENDENT_AMBULATORY_CARE_PROVIDER_SITE_OTHER): Payer: Medicaid Other | Admitting: Internal Medicine

## 2020-05-25 ENCOUNTER — Encounter (INDEPENDENT_AMBULATORY_CARE_PROVIDER_SITE_OTHER): Payer: Self-pay | Admitting: Internal Medicine

## 2020-05-25 VITALS — BP 110/64 | HR 97 | Temp 97.9°F | Ht 65.75 in | Wt 175.2 lb

## 2020-05-25 DIAGNOSIS — E559 Vitamin D deficiency, unspecified: Secondary | ICD-10-CM | POA: Diagnosis not present

## 2020-05-25 DIAGNOSIS — R6889 Other general symptoms and signs: Secondary | ICD-10-CM | POA: Diagnosis not present

## 2020-05-25 DIAGNOSIS — Z13220 Encounter for screening for lipoid disorders: Secondary | ICD-10-CM

## 2020-05-25 DIAGNOSIS — N926 Irregular menstruation, unspecified: Secondary | ICD-10-CM | POA: Diagnosis not present

## 2020-05-25 DIAGNOSIS — E282 Polycystic ovarian syndrome: Secondary | ICD-10-CM | POA: Diagnosis not present

## 2020-05-25 DIAGNOSIS — R5383 Other fatigue: Secondary | ICD-10-CM | POA: Diagnosis not present

## 2020-05-25 DIAGNOSIS — R519 Headache, unspecified: Secondary | ICD-10-CM | POA: Diagnosis not present

## 2020-05-25 DIAGNOSIS — R5381 Other malaise: Secondary | ICD-10-CM | POA: Diagnosis not present

## 2020-05-25 NOTE — Progress Notes (Signed)
Metrics: Intervention Frequency ACO  Documented Smoking Status Yearly  Screened one or more times in 24 months  Cessation Counseling or  Active cessation medication Past 24 months  Past 24 months   Guideline developer: UpToDate (See UpToDate for funding source) Date Released: 2014       Wellness Office Visit  Subjective:  Patient ID: Catherine Villarreal, female    DOB: Sep 12, 1990  Age: 30 y.o. MRN: [id number]  CC: This 30 year old lady comes as a new patient for review of symptoms and to see if I can help her. HPI She is very frustrated that she does not feel that people are listening to her symptoms.  She describes extreme fatigue throughout the whole day.  She describes cold intolerance, irregular menstrual cycles, mood swings which have been diagnosed as bipolar disorder. Most of the symptoms have been present for the last 4 years since the birth of her 29-year-old son. She also describes a degree of hair loss. She appears to have a strong family history of thyroid disease, including her mother who had Graves' disease and other extended family members who have thyroid disease. She denies acne, hirsutism but does have a history of a miscarriage, and irregular menstrual cycles. She also has a history of gallbladder disease.  Past Medical History:  Diagnosis Date  . Anxiety and depression   . Chronic headaches   . Gallstones   . Kidney stones   . Migraine    Past Surgical History:  Procedure Laterality Date  . CHOLECYSTECTOMY  2018     Family History  Problem Relation Age of Onset  . Cancer Mother   . Breast cancer Mother   . Thyroid cancer Mother   . Other Mother   . Thyroid disease Mother   . Graves' disease Mother   . Drug abuse Brother   . Stroke Father   . Thyroid disease Maternal Aunt   . 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    Caffeine- coffee, energy drink daily.   Social History   Tobacco Use  . Smoking status: Current Every Day Smoker    Packs/day: 0.75    Types: Cigarettes  . Smokeless tobacco: Never Used  Substance Use Topics  . Alcohol use: Not Currently    Comment: quit Oct 2021    Current Meds  Medication Sig  . acetaminophen (TYLENOL) 500 MG tablet Take 1,000 mg by mouth every 6 (six) hours as needed.  . Ibuprofen 200 MG CAPS Take 800 mg by mouth as needed.  . paragard intrauterine copper IUD IUD 1 each by Intrauterine route once.  . venlafaxine XR (EFFEXOR XR) 75 MG 24 hr capsule 225 mg daily (take along with 150 mg cap)  . venlafaxine XR (EFFEXOR-XR) 150 MG 24 hr capsule 225 mg daily. Take along with 75 mg cap     Flowsheet Row Office Visit from 05/25/2020 in [facility]  PHQ-9 Total Score 6      Objective:   Today's Vitals: BP 110/64   Pulse 97   Temp 97.9 F (36.6 C) (Temporal)   Ht 5' 5.75" (1.67 m)   Wt 175 lb 3.2 oz (79.5 kg)   SpO2 97%   BMI 28.49 kg/m  Vitals with BMI 05/25/2020 02/26/2020 01/30/2020  Height 5' 5.75" 5\' 7"  -  Weight 175 lbs 3 oz 174 lbs 173 lbs  BMI  28.5 27.25 -  Systolic 110 115 518  Diastolic 64 77 89  Pulse 97 85 86     Physical Exam  She looks systemically well, slightly overweight.     Assessment   1. Malaise and fatigue   2. Intractable episodic headache, unspecified headache type   3. Cold intolerance   4. Irregular menstrual bleeding   5. PCOS (polycystic ovarian syndrome)   6. Screening for lipoid disorders   7. Vitamin D deficiency disease       Tests ordered Orders Placed This Encounter  Procedures  . Follicle stimulating hormone  . Luteinizing hormone  . Testos,Total,Free and SHBG (Female)  . T3, free  . T4, free  . TSH  . Lipid panel  . COMPLETE METABOLIC PANEL WITH GFR  . VITAMIN D 25 Hydroxy (Vit-D Deficiency, Fractures)  . CBC     Plan: 1. I wonder if this lady has thyroid hypofunction and I also  wonder if this lady has PCOS. 2. Blood work is ordered. 3. I will see her in the next several weeks to discuss all these results in detail. 4. I also discussed COVID-19 vaccination and gave her more information regarding it.  I spent 45 minutes with this lady reviewing her medical chart and listening to her symptoms and formulating a plan.   No orders of the defined types were placed in this encounter.   Wilson Singer, MD

## 2020-05-28 DIAGNOSIS — F3181 Bipolar II disorder: Secondary | ICD-10-CM | POA: Diagnosis not present

## 2020-05-29 LAB — VITAMIN D 25 HYDROXY (VIT D DEFICIENCY, FRACTURES): Vit D, 25-Hydroxy: 17 ng/mL — ABNORMAL LOW (ref 30–100)

## 2020-05-29 LAB — CBC
HCT: 40.9 % (ref 35.0–45.0)
Hemoglobin: 13.9 g/dL (ref 11.7–15.5)
MCH: 30.8 pg (ref 27.0–33.0)
MCHC: 34 g/dL (ref 32.0–36.0)
MCV: 90.5 fL (ref 80.0–100.0)
MPV: 10.4 fL (ref 7.5–12.5)
Platelets: 282 10*3/uL (ref 140–400)
RBC: 4.52 10*6/uL (ref 3.80–5.10)
RDW: 11.8 % (ref 11.0–15.0)
WBC: 7 10*3/uL (ref 3.8–10.8)

## 2020-05-29 LAB — COMPLETE METABOLIC PANEL WITH GFR
AG Ratio: 1.9 (calc) (ref 1.0–2.5)
ALT: 17 U/L (ref 6–29)
AST: 22 U/L (ref 10–30)
Albumin: 4.4 g/dL (ref 3.6–5.1)
Alkaline phosphatase (APISO): 89 U/L (ref 31–125)
BUN: 8 mg/dL (ref 7–25)
CO2: 29 mmol/L (ref 20–32)
Calcium: 9.6 mg/dL (ref 8.6–10.2)
Chloride: 104 mmol/L (ref 98–110)
Creat: 0.81 mg/dL (ref 0.50–1.10)
GFR, Est African American: 115 mL/min/{1.73_m2} (ref >=60)
GFR, Est Non African American: 99 mL/min/{1.73_m2} (ref >=60)
Globulin: 2.3 g/dL (calc) (ref 1.9–3.7)
Glucose, Bld: 111 mg/dL (ref 65–139)
Potassium: 3.8 mmol/L (ref 3.5–5.3)
Sodium: 141 mmol/L (ref 135–146)
Total Bilirubin: 0.3 mg/dL (ref 0.2–1.2)
Total Protein: 6.7 g/dL (ref 6.1–8.1)

## 2020-05-29 LAB — LIPID PANEL
Cholesterol: 206 mg/dL — ABNORMAL HIGH (ref <200)
HDL: 41 mg/dL — ABNORMAL LOW (ref >=50)
LDL Cholesterol (Calc): 113 mg/dL (calc) — ABNORMAL HIGH
Non-HDL Cholesterol (Calc): 165 mg/dL (calc) — ABNORMAL HIGH (ref <130)
Total CHOL/HDL Ratio: 5 (calc) — ABNORMAL HIGH (ref <5.0)
Triglycerides: 366 mg/dL — ABNORMAL HIGH (ref <150)

## 2020-05-29 LAB — TESTOS,TOTAL,FREE AND SHBG (FEMALE)
Free Testosterone: 2.5 pg/mL (ref 0.1–6.4)
Sex Hormone Binding: 61 nmol/L (ref 17–124)
Testosterone, Total, LC-MS-MS: 22 ng/dL (ref 2–45)

## 2020-05-29 LAB — T4, FREE: Free T4: 1.1 ng/dL (ref 0.8–1.8)

## 2020-05-29 LAB — FOLLICLE STIMULATING HORMONE: FSH: 7.5 m[IU]/mL

## 2020-05-29 LAB — T3, FREE: T3, Free: 3.1 pg/mL (ref 2.3–4.2)

## 2020-05-29 LAB — TSH: TSH: 0.84 mIU/L

## 2020-05-29 LAB — LUTEINIZING HORMONE: LH: 13 m[IU]/mL
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
## 2020-06-04 DIAGNOSIS — F3181 Bipolar II disorder: Secondary | ICD-10-CM | POA: Diagnosis not present

## 2020-06-08 ENCOUNTER — Ambulatory Visit
Admission: EM | Admit: 2020-06-08 | Discharge: 2020-06-08 | Disposition: A | Discharge status: Home / Self Care | Payer: Medicaid Other | Attending: Family Medicine | Admitting: Family Medicine

## 2020-06-08 ENCOUNTER — Encounter: Payer: Self-pay | Admitting: Emergency Medicine

## 2020-06-08 ENCOUNTER — Other Ambulatory Visit: Payer: Self-pay

## 2020-06-08 ENCOUNTER — Encounter: Payer: Self-pay | Admitting: Psychiatry

## 2020-06-08 ENCOUNTER — Telehealth (INDEPENDENT_AMBULATORY_CARE_PROVIDER_SITE_OTHER): Payer: Medicaid Other | Admitting: Psychiatry

## 2020-06-08 DIAGNOSIS — F331 Major depressive disorder, recurrent, moderate: Secondary | ICD-10-CM

## 2020-06-08 DIAGNOSIS — S61012A Laceration without foreign body of left thumb without damage to nail, initial encounter: Secondary | ICD-10-CM | POA: Diagnosis not present

## 2020-06-08 DIAGNOSIS — M79645 Pain in left finger(s): Secondary | ICD-10-CM

## 2020-06-08 MED ORDER — VENLAFAXINE HCL ER 75 MG PO CP24: ORAL_CAPSULE | ORAL | 2 refills | Status: DC

## 2020-06-08 MED ORDER — VENLAFAXINE HCL ER 150 MG PO CP24: ORAL_CAPSULE | ORAL | 2 refills | Status: DC

## 2020-06-08 NOTE — Discharge Instructions (Addendum)
I have placed 5 sutures in your left thumb. These need to stay in for about 5-7 days.  Follow up then for suture removal.  Follow up with this office or with primary care for increased swelling, redness, tenderness, warmth, drainage from the area.  Follow up in the ER for red streaking up your hand/arm, high fever, trouble swallowing, trouble breathing, other concerning symptoms.

## 2020-06-08 NOTE — Patient Instructions (Signed)
1.Continuevenlafaxine 225 mg daily  2.Next appointment- 5/17 at 3:30 PM

## 2020-06-08 NOTE — ED Triage Notes (Signed)
Laceration to LT thumb today from box cutter

## 2020-06-08 NOTE — ED Provider Notes (Signed)
[facility]    CSN: [id number] Arrival date & time: 06/08/20  1300      History   Chief Complaint Chief Complaint  Patient presents with  . Laceration    HPI Catherine Villarreal is a 30 y.o. female.   Reports that she cut the medial aspect of the L thumb with a box cutter just prior to arrival. Has recently had tetanus vaccine within the last 2-3 years. Has cleansed the area at home. Has not attempted OTC medication. Reports bleeding is controlled. Denies erythema, discharge from the area. Denies foreign body to the area. Denies nail damage  The history is provided by the patient.  Laceration   Past Medical History:  Diagnosis Date  . Anxiety and depression   . Chronic headaches   . Gallstones   . Kidney stones   . Migraine     Patient Active Problem List   Diagnosis Date Noted  . Family history of breast cancer 01/30/2020  . Presence of IUD 04/25/2018  . Pap smear of cervix unsatisfactory 08/21/2017    Past Surgical History:  Procedure Laterality Date  . CHOLECYSTECTOMY  2018    OB History    Gravida  2   Para  1   Term  1   Preterm  0   AB  1   Living  1     SAB  1   IAB  0   Ectopic  0   Multiple  0   Live Births  1            Home Medications    Prior to Admission medications   Medication Sig Start Date End Date Taking? Authorizing Provider  acetaminophen (TYLENOL) 500 MG tablet Take 1,000 mg by mouth every 6 (six) hours as needed.    [provider]  Ibuprofen 200 MG CAPS Take 800 mg by mouth as needed.    [provider]  paragard intrauterine copper IUD IUD 1 each by Intrauterine route once.    [provider]  venlafaxine XR (EFFEXOR XR) 75 MG 24 hr capsule 225 mg daily (take along with 150 mg cap) 06/17/20   Hisada, Barbee Cough, MD  venlafaxine XR (EFFEXOR-XR) 150 MG 24 hr capsule 225 mg daily. Take along with 75 mg cap 06/17/20   [name], MD    Family History Family History  Problem  Relation Age of Onset  . Cancer Mother   . Breast cancer Mother   . Thyroid cancer Mother   . Other Mother   . Thyroid disease Mother   . Graves' disease Mother   . Drug abuse Brother   . Stroke Father   . Thyroid disease Maternal Aunt   . Thyroid disease Maternal Grandmother     Social History Social History   Tobacco Use  . Smoking status: Current Every Day Smoker    Packs/day: 0.75    Types: Cigarettes  . Smokeless tobacco: Never Used  Vaping Use  . Vaping Use: Never used  Substance Use Topics  . Alcohol use: Not Currently    Comment: quit Oct 2021  . Drug use: Never     Allergies   Dilaudid [hydromorphone hcl] and Adhesive [tape]   Review of Systems Review of Systems   Physical Exam Triage Vital Signs ED Triage Vitals  Enc Vitals Group     BP 06/08/20 1432 111/75     Pulse Rate 06/08/20 1432 75     Resp 06/08/20 1432 18  Temp 06/08/20 1432 98.1 F (36.7 C)     Temp Source 06/08/20 1432 Oral     SpO2 06/08/20 1432 98 %     Weight --      Height --      Head Circumference --      Peak Flow --      Pain Score 06/08/20 1431 2     Pain Loc --      Pain Edu? --      Excl. in GC? --    No data found.  Updated Vital Signs BP 111/75 (BP Location: Right Arm)   Pulse 75   Temp 98.1 F (36.7 C) (Oral)   Resp 18   SpO2 98%   Visual Acuity Right Eye Distance:   Left Eye Distance:   Bilateral Distance:    Right Eye Near:   Left Eye Near:    Bilateral Near:     Physical Exam Vitals and nursing note reviewed.  Constitutional:      General: She is not in acute distress.    Appearance: Normal appearance. She is well-developed. She is not ill-appearing.  HENT:     Head: Normocephalic and atraumatic.  Eyes:     Conjunctiva/sclera: Conjunctivae normal.  Cardiovascular:     Rate and Rhythm: Normal rate and regular rhythm.     Heart sounds: No murmur heard.   Pulmonary:     Effort: Pulmonary effort is normal. No respiratory distress.      Breath sounds: Normal breath sounds.  Abdominal:     Palpations: Abdomen is soft.     Tenderness: There is no abdominal tenderness.  Musculoskeletal:        General: Normal range of motion.     Cervical back: Normal range of motion and neck supple.  Skin:    General: Skin is warm and dry.     Capillary Refill: Capillary refill takes less than 2 seconds.     Findings: Laceration present.          Comments: 1cm laceration to medial aspect of L thumb  Neurological:     General: No focal deficit present.     Mental Status: She is alert and oriented to person, place, and time.  Psychiatric:        Mood and Affect: Mood normal.        Behavior: Behavior normal.        Thought Content: Thought content normal.      UC Treatments / Results  Labs (all labs ordered are listed, but only abnormal results are displayed) Labs Reviewed - No data to display  EKG   Radiology No results found.  Procedures Laceration Repair  Date/Time: 06/08/2020 3:18 PM Performed by: Moshe Cipro, NP Authorized by: Moshe Cipro, NP   Consent:    Consent obtained:  Verbal   Consent given by:  Patient   Risks, benefits, and alternatives were discussed: yes     Risks discussed:  Infection, pain and nerve damage Universal protocol:    Patient identity confirmed:  Verbally with patient and arm band Anesthesia:    Anesthesia method:  Local infiltration   Local anesthetic:  Lidocaine 1% w/o epi Laceration details:    Location:  Finger   Finger location:  L thumb   Length (cm):  1   Depth (mm):  3 Exploration:    Hemostasis achieved with:  Direct pressure   Contaminated: no   Treatment:    Area cleansed with:  Shur-Clens  Amount of cleaning:  Standard   Debridement:  None   Undermining:  None Skin repair:    Repair method:  Sutures   Suture size:  4-0   Suture material:  Prolene   Suture technique:  Simple interrupted   Number of sutures:  5 Approximation:    Approximation:   Close Repair type:    Repair type:  Simple Post-procedure details:    Dressing:  Non-adherent dressing and bulky dressing   Procedure completion:  Tolerated   (including critical care time)  Medications Ordered in UC Medications - No data to display  Initial Impression / Assessment and Plan / UC Course  I have reviewed the triage vital signs and the nursing notes.  Pertinent labs & imaging results that were available during my care of the patient were reviewed by me and considered in my medical decision making (see chart for details).    L Thumb Pain Laceration to L Thumb  Repaired laceration as above Follow up in 5-7 days for suture removal Follow up sooner for s/s infection Follow up in the ER for high fever, red streaking up the hand/arm, trouble breathing, trouble swallowing, other concerning symptoms  Final Clinical Impressions(s) / UC Diagnoses   Final diagnoses:  Pain of left thumb  Laceration of left thumb without foreign body without damage to nail, initial encounter     Discharge Instructions     I have placed 5 sutures in your left thumb. These need to stay in for about 5-7 days.  Follow up then for suture removal.  Follow up with this office or with primary care for increased swelling, redness, tenderness, warmth, drainage from the area.  Follow up in the ER for red streaking up your hand/arm, high fever, trouble swallowing, trouble breathing, other concerning symptoms.     ED Prescriptions    None     PDMP not reviewed this encounter.   Moshe Cipro, NP 06/08/20 1528

## 2020-06-18 DIAGNOSIS — F331 Major depressive disorder, recurrent, moderate: Secondary | ICD-10-CM | POA: Diagnosis not present

## 2020-06-23 ENCOUNTER — Encounter (INDEPENDENT_AMBULATORY_CARE_PROVIDER_SITE_OTHER): Payer: Self-pay | Admitting: Internal Medicine

## 2020-06-23 ENCOUNTER — Ambulatory Visit (INDEPENDENT_AMBULATORY_CARE_PROVIDER_SITE_OTHER): Payer: Medicaid Other | Admitting: Internal Medicine

## 2020-06-23 ENCOUNTER — Other Ambulatory Visit: Payer: Self-pay

## 2020-06-23 VITALS — BP 118/80 | HR 113 | Temp 97.7°F | Ht 65.75 in | Wt 173.6 lb

## 2020-06-23 DIAGNOSIS — R6889 Other general symptoms and signs: Secondary | ICD-10-CM | POA: Diagnosis not present

## 2020-06-23 DIAGNOSIS — R5381 Other malaise: Secondary | ICD-10-CM | POA: Diagnosis not present

## 2020-06-23 DIAGNOSIS — E559 Vitamin D deficiency, unspecified: Secondary | ICD-10-CM | POA: Diagnosis not present

## 2020-06-23 DIAGNOSIS — E282 Polycystic ovarian syndrome: Secondary | ICD-10-CM

## 2020-06-23 DIAGNOSIS — R5383 Other fatigue: Secondary | ICD-10-CM | POA: Diagnosis not present

## 2020-06-23 MED ORDER — NP THYROID 60 MG PO TABS
60.0000 mg | ORAL_TABLET | Freq: Every day | ORAL | 3 refills | Status: DC
Start: 2020-06-23 — End: 2020-08-04

## 2020-06-23 MED ORDER — PROGESTERONE 200 MG PO CAPS: 200.0000 mg | ORAL_CAPSULE | Freq: Every evening | ORAL | 3 refills | Status: DC

## 2020-06-23 NOTE — Progress Notes (Signed)
Metrics: Intervention Frequency ACO  Documented Smoking Status Yearly  Screened one or more times in 24 months  Cessation Counseling or  Active cessation medication Past 24 months  Past 24 months   Guideline developer: UpToDate (See UpToDate for funding source) Date Released: 2014       Wellness Office Visit  Subjective:  Patient ID: Catherine Villarreal, female    DOB: February 26, 1990  Age: 30 y.o. MRN: [id number]  CC: This lady comes in for follow-up of blood work and further recommendations. HPI She has significant vitamin D deficiency. She also appears to have PCOS based on FSH/LH ratio flip. She has dyslipidemia with low HDL cholesterol and hypertriglyceridemia, again consistent with increased visceral fat, insulin resistance and a diagnosis of PCOS. She continues to have symptoms before including fatigue, cold intolerance, irregular menstrual cycles despite an IUD. She has been put on Effexor by her psychiatrist.  Past Medical History:  Diagnosis Date  . Anxiety and depression   . Chronic headaches   . Gallstones   . Kidney stones   . Migraine    Past Surgical History:  Procedure Laterality Date  . CHOLECYSTECTOMY  2018     Family History  Problem Relation Age of Onset  . Cancer Mother   . Breast cancer Mother   . Thyroid cancer Mother   . Other Mother   . Thyroid disease Mother   . Graves' disease Mother   . Drug abuse Brother   . Stroke Father   . Thyroid disease Maternal Aunt   . 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   Caffeine- coffee, energy drink daily.   Social History   Tobacco Use  . Smoking status: Current Every Day Smoker    Packs/day: 0.75    Types: Cigarettes  . Smokeless tobacco: Never Used  Substance Use Topics  . Alcohol use: Not Currently    Comment: quit Oct 2021    Current Meds  Medication Sig  .  acetaminophen (TYLENOL) 500 MG tablet Take 1,000 mg by mouth every 6 (six) hours as needed.  . Ibuprofen 200 MG CAPS Take 800 mg by mouth as needed.  . NP THYROID 60 MG tablet Take 1 tablet (60 mg total) by mouth daily before breakfast.  . paragard intrauterine copper IUD IUD 1 each by Intrauterine route once.  . progesterone (PROMETRIUM) 200 MG capsule Take 1 capsule (200 mg total) by mouth at bedtime.  Marland Kitchen venlafaxine XR (EFFEXOR XR) 75 MG 24 hr capsule 225 mg daily (take along with 150 mg cap)  . venlafaxine XR (EFFEXOR-XR) 150 MG 24 hr capsule 225 mg daily. Take along with 75 mg cap     Flowsheet Row Office Visit from 05/25/2020 in [facility]  PHQ-9 Total Score 6      Objective:   Today's Vitals: BP 118/80   Pulse (!) 113   Temp 97.7 F (36.5 C) (Temporal)   Ht 5' 5.75" (1.67 m)   Wt 173 lb 9.6 oz (78.7 kg)   SpO2 98%   BMI 28.23 kg/m  Vitals with BMI 06/23/2020 06/08/2020 05/25/2020  Height 5' 5.75" - 5' 5.75"  Weight 173 lbs 10 oz - 175 lbs 3 oz  BMI 28.23 - 28.5  Systolic 118 111 875  Diastolic 80 75 64  Pulse 113 75 97     Physical Exam  She remains  overweight.  Blood pressure acceptable.    Assessment   1. PCOS (polycystic ovarian syndrome)   2. Cold intolerance   3. Malaise and fatigue       Tests ordered No orders of the defined types were placed in this encounter.    Plan: 1. We discussed the pathophysiology of PCOS.  We discussed nutrition with the concept of intermittent fasting and more of a plant-based diet. 2. 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 3. I recommended she start taking vitamin D3 10,000 units daily. 4. I will also start her on progesterone at night around the time of her cycles but she may benefit taking this throughout the month  also. 5. Follow-up in about 6 weeks to see how she is doing.   Meds ordered this encounter  Medications  . NP THYROID 60 MG tablet    Sig: Take 1 tablet (60 mg total) by mouth daily before breakfast.    Dispense:  30 tablet    Refill:  3  . progesterone (PROMETRIUM) 200 MG capsule    Sig: Take 1 capsule (200 mg total) by mouth at bedtime.    Dispense:  30 capsule    Refill:  3    Crista Nuon Normajean Glasgow, MD

## 2020-06-25 DIAGNOSIS — F3181 Bipolar II disorder: Secondary | ICD-10-CM | POA: Diagnosis not present
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
## 2020-07-02 DIAGNOSIS — F331 Major depressive disorder, recurrent, moderate: Secondary | ICD-10-CM | POA: Diagnosis not present

## 2020-07-06 ENCOUNTER — Other Ambulatory Visit: Payer: Self-pay

## 2020-07-06 ENCOUNTER — Encounter: Payer: Self-pay | Admitting: Psychiatry

## 2020-07-06 ENCOUNTER — Telehealth (INDEPENDENT_AMBULATORY_CARE_PROVIDER_SITE_OTHER): Payer: Medicaid Other | Admitting: Psychiatry

## 2020-07-06 DIAGNOSIS — F331 Major depressive disorder, recurrent, moderate: Secondary | ICD-10-CM

## 2020-07-06 NOTE — Patient Instructions (Signed)
1.Continuevenlafaxine 225 mg daily  2.Next appointment- 6/30 at 11:30PM

## 2020-07-30 DIAGNOSIS — F331 Major depressive disorder, recurrent, moderate: Secondary | ICD-10-CM | POA: Diagnosis not present

## 2020-08-04 ENCOUNTER — Ambulatory Visit (INDEPENDENT_AMBULATORY_CARE_PROVIDER_SITE_OTHER): Payer: Medicaid Other | Admitting: Internal Medicine

## 2020-08-04 ENCOUNTER — Other Ambulatory Visit: Payer: Self-pay

## 2020-08-04 ENCOUNTER — Encounter (INDEPENDENT_AMBULATORY_CARE_PROVIDER_SITE_OTHER): Payer: Self-pay | Admitting: Internal Medicine

## 2020-08-04 VITALS — BP 120/70 | HR 76 | Temp 97.5°F | Resp 18 | Ht 65.0 in | Wt 172.0 lb

## 2020-08-04 DIAGNOSIS — E559 Vitamin D deficiency, unspecified: Secondary | ICD-10-CM

## 2020-08-04 DIAGNOSIS — R6889 Other general symptoms and signs: Secondary | ICD-10-CM | POA: Diagnosis not present

## 2020-08-04 DIAGNOSIS — E282 Polycystic ovarian syndrome: Secondary | ICD-10-CM | POA: Diagnosis not present

## 2020-08-04 MED ORDER — NP THYROID 60 MG PO TABS: 60.0000 mg | ORAL_TABLET | Freq: Two times a day (BID) | ORAL | 3 refills | Status: DC

## 2020-08-04 NOTE — Progress Notes (Signed)
Metrics: Intervention Frequency ACO  Documented Smoking Status Yearly  Screened one or more times in 24 months  Cessation Counseling or  Active cessation medication Past 24 months  Past 24 months   Guideline developer: UpToDate (See UpToDate for funding source) Date Released: 2014       Wellness Office Visit  Subjective:  Patient ID: Catherine Villarreal, female    DOB: 02-27-1990  Age: 30 y.o. MRN: [id number]  CC: This lady comes in for follow-up of PCOS, thyroid hypofunction, vitamin D deficiency. HPI  She says that since starting NP thyroid, she does feel somewhat better.  She would clearly like to feel better than she is still.  She has had more days that she has the energy to do something.  She takes progesterone at night every night and seems to feel that her mood is more stable. Past Medical History:  Diagnosis Date   Anxiety and depression    Chronic headaches    Gallstones    Kidney stones    Migraine    Past Surgical History:  Procedure Laterality Date   CHOLECYSTECTOMY  2018     Family History  Problem Relation Age of Onset   Cancer Mother    Breast cancer Mother    Thyroid cancer Mother    Other Mother    Thyroid disease Mother    Luiz Blare' disease Mother    Drug abuse Brother    Stroke Father    Thyroid disease Maternal Aunt    Thyroid disease Maternal Grandmother     Social History   Social History Narrative   Lives with  child, partner,mother in Social worker.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   Caffeine- coffee, energy drink daily.   Social History   Tobacco Use   Smoking status: Every Day    Packs/day: 0.75    Pack years: 0.00    Types: Cigarettes   Smokeless tobacco: Never  Substance Use Topics   Alcohol use: Not Currently    Comment: quit Oct 2021    Current Meds  Medication Sig   acetaminophen (TYLENOL) 500 MG tablet Take 1,000 mg by mouth every 6 (six) hours as needed.   Cholecalciferol (VITAMIN D-3) 125 MCG (5000 UT) TABS Take 2  tablets by mouth daily at 12 noon.   Ibuprofen 200 MG CAPS Take 800 mg by mouth as needed.   paragard intrauterine copper IUD IUD 1 each by Intrauterine route once.   progesterone (PROMETRIUM) 200 MG capsule Take 1 capsule (200 mg total) by mouth at bedtime.   venlafaxine XR (EFFEXOR XR) 75 MG 24 hr capsule 225 mg daily (take along with 150 mg cap)   venlafaxine XR (EFFEXOR-XR) 150 MG 24 hr capsule 225 mg daily. Take along with 75 mg cap   [DISCONTINUED] NP THYROID 60 MG tablet Take 1 tablet (60 mg total) by mouth daily before breakfast.     Flowsheet Row Video Visit [facility] 07/06/2020 in [facility]  PHQ-9 Total Score 11       Objective:   Today's Vitals: BP 120/70 (BP Location: Right Arm, Patient Position: Sitting, Cuff Size: Small)   Pulse 76   Temp (!) 97.5 F (36.4 C) (Temporal)   Resp 18   Ht 5\' 5"  (1.651 m)   Wt 172 lb (78 kg)   SpO2 98%   BMI 28.62 kg/m  Vitals with BMI 08/04/2020 06/23/2020 06/08/2020  Height 5\' 5"  5' 5.75" -  Weight 172 lbs 173 lbs 10 oz -  BMI 28.62 28.23 -  Systolic 120 118 865  Diastolic 70 80 75  Pulse 76 113 75     Physical Exam  She looks systemically well.  She has lost 3 pounds since the first time I saw her.     Assessment   1. Cold intolerance   2. PCOS (polycystic ovarian syndrome)   3. Vitamin D deficiency disease       Tests ordered No orders of the defined types were placed in this encounter.    Plan: 1.  We will continue to optimize her thyroid and I have told to start taking NP thyroid 60 mg twice a day now and if she has side effects, she will let me know. 2.  Continue with vitamin D3 10,000 units daily for vitamin D deficiency. 3.  Continue to work on nutrition with intermittent fasting and more of a plant-based diet that we have discussed before. 4.  I will see her in about a month's time to see how she is doing and we will do blood work then.    Meds ordered this encounter   Medications   NP THYROID 60 MG tablet    Sig: Take 1 tablet (60 mg total) by mouth 2 (two) times daily.    Dispense:  60 tablet    Refill:  3    Skyler Carel Normajean Glasgow, MD

## 2020-08-04 NOTE — Progress Notes (Signed)
Pt is drinking monster drinks upon arrival. She said she has to have to get though her day. She states she has extreme exhausted   daily. Drinks 2-3 day. She said she has kids so she has to have something for energy.  Got the ear piercing to help with the migraines. So it helps better.Effexor & progesterone she takes at night so it helps sleep.

## 2020-08-05 ENCOUNTER — Other Ambulatory Visit (INDEPENDENT_AMBULATORY_CARE_PROVIDER_SITE_OTHER): Payer: Self-pay | Admitting: Internal Medicine

## 2020-08-05 MED ORDER — NP THYROID 60 MG PO TABS: 60.0000 mg | ORAL_TABLET | Freq: Two times a day (BID) | ORAL | 3 refills | Status: DC

## 2020-08-10 ENCOUNTER — Telehealth (INDEPENDENT_AMBULATORY_CARE_PROVIDER_SITE_OTHER): Payer: Self-pay

## 2020-08-10 ENCOUNTER — Other Ambulatory Visit (INDEPENDENT_AMBULATORY_CARE_PROVIDER_SITE_OTHER): Payer: Self-pay | Admitting: Internal Medicine

## 2020-08-10 MED ORDER — NP THYROID 60 MG PO TABS: 60.0000 mg | ORAL_TABLET | Freq: Two times a day (BID) | ORAL | 3 refills | Status: DC

## 2020-08-10 NOTE — Telephone Encounter (Signed)
I have sent the NP thyroid 60 mg tablet, 1 tablet twice a day to the [facility] in [place].

## 2020-08-10 NOTE — Telephone Encounter (Signed)
Np thyroid She said you change her dose.

## 2020-08-10 NOTE — Telephone Encounter (Signed)
Which medication ?

## 2020-08-10 NOTE — Telephone Encounter (Signed)
Okay thank you

## 2020-08-13 DIAGNOSIS — F331 Major depressive disorder, recurrent, moderate: Secondary | ICD-10-CM | POA: Diagnosis not present

## 2020-08-16 ENCOUNTER — Ambulatory Visit (INDEPENDENT_AMBULATORY_CARE_PROVIDER_SITE_OTHER): Payer: Medicaid Other | Admitting: Internal Medicine

## 2020-08-16 ENCOUNTER — Encounter (INDEPENDENT_AMBULATORY_CARE_PROVIDER_SITE_OTHER): Payer: Self-pay | Admitting: Internal Medicine

## 2020-08-16 ENCOUNTER — Other Ambulatory Visit: Payer: Self-pay

## 2020-08-16 VITALS — BP 120/80 | HR 89 | Temp 97.8°F | Resp 18 | Ht 65.0 in | Wt 172.6 lb

## 2020-08-16 DIAGNOSIS — H60331 Swimmer's ear, right ear: Secondary | ICD-10-CM | POA: Diagnosis not present

## 2020-08-16 MED ORDER — PROGESTERONE 200 MG PO CAPS: 200.0000 mg | ORAL_CAPSULE | Freq: Every evening | ORAL | 3 refills | Status: DC

## 2020-08-16 MED ORDER — AMOXICILLIN-POT CLAVULANATE 875-125 MG PO TABS: 1.0000 | ORAL_TABLET | Freq: Two times a day (BID) | ORAL | 0 refills | Status: DC

## 2020-08-16 MED ORDER — NP THYROID 120 MG PO TABS
120.0000 mg | ORAL_TABLET | Freq: Every day | ORAL | 3 refills | Status: DC
Start: 2020-08-16 — End: 2021-01-04

## 2020-08-16 MED ORDER — NEOMYCIN-POLYMYXIN-DEXAMETH 3.5-10000-0.1 OP SUSP: 2.0000 [drp] | Freq: Four times a day (QID) | OPHTHALMIC | 0 refills | Status: DC
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
## 2020-08-16 NOTE — Progress Notes (Signed)
Metrics: Intervention Frequency ACO  Documented Smoking Status Yearly  Screened one or more times in 24 months  Cessation Counseling or  Active cessation medication Past 24 months  Past 24 months   Guideline developer: UpToDate (See UpToDate for funding source) Date Released: 2014       Wellness Office Visit  Subjective:  Patient ID: MARTIKA SANGIACOMO, female    DOB: May 19, 1990  Age: 30 y.o. MRN: [id number]  CC: Right ear pain and reduced hearing. HPI  Patient describes the above symptoms for the last few days.  She swam in a pool which she said was not very clean.  Since this time, she has had ear symptoms.  She denies any fever. Past Medical History:  Diagnosis Date   Anxiety and depression    Chronic headaches    Gallstones    Kidney stones    Migraine    Past Surgical History:  Procedure Laterality Date   CHOLECYSTECTOMY  2018     Family History  Problem Relation Age of Onset   Cancer Mother    Breast cancer Mother    Thyroid cancer Mother    Other Mother    Thyroid disease Mother    Luiz Blare' disease Mother    Drug abuse Brother    Stroke Father    Thyroid disease Maternal Aunt    Thyroid disease Maternal Grandmother     Social History   Social History Narrative   Lives with  child, partner,mother in Social worker.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   Caffeine- coffee, energy drink daily.   Social History   Tobacco Use   Smoking status: Every Day    Packs/day: 0.75    Pack years: 0.00    Types: Cigarettes   Smokeless tobacco: Never  Substance Use Topics   Alcohol use: Not Currently    Comment: quit Oct 2021    Current Meds  Medication Sig   acetaminophen (TYLENOL) 500 MG tablet Take 1,000 mg by mouth every 6 (six) hours as needed.   amoxicillin-clavulanate (AUGMENTIN) 875-125 MG tablet Take 1 tablet by mouth 2 (two) times daily.   Cholecalciferol (VITAMIN D-3) 125 MCG (5000 UT) TABS Take 2 tablets by mouth daily at 12 noon.   Ibuprofen 200 MG CAPS  Take 800 mg by mouth as needed.   neomycin-polymyxin b-dexamethasone (MAXITROL) 3.5-10000-0.1 SUSP Place 2 drops into the right eye every 6 (six) hours.   NP THYROID 120 MG tablet Take 1 tablet (120 mg total) by mouth daily before breakfast.   paragard intrauterine copper IUD IUD 1 each by Intrauterine route once.   venlafaxine XR (EFFEXOR XR) 75 MG 24 hr capsule 225 mg daily (take along with 150 mg cap)   venlafaxine XR (EFFEXOR-XR) 150 MG 24 hr capsule 225 mg daily. Take along with 75 mg cap   [DISCONTINUED] NP THYROID 60 MG tablet Take 1 tablet (60 mg total) by mouth 2 (two) times daily.   [DISCONTINUED] progesterone (PROMETRIUM) 200 MG capsule Take 1 capsule (200 mg total) by mouth at bedtime.     Flowsheet Row Office Visit from 05/25/2020 in [facility]  PHQ-9 Total Score 6       Objective:   Today's Vitals: BP 120/80 (BP Location: Right Arm, Patient Position: Sitting, Cuff Size: Normal)   Pulse 89   Temp 97.8 F (36.6 C) (Temporal)   Resp 18   Ht 5\' 5"  (1.651 m)   Wt 172 lb 9.6 oz (78.3 kg)   SpO2 99%  BMI 28.72 kg/m  Vitals with BMI 08/16/2020 08/04/2020 06/23/2020  Height 5\' 5"  5\' 5"  5' 5.75"  Weight 172 lbs 10 oz 172 lbs 173 lbs 10 oz  BMI 28.72 28.62 28.23  Systolic 120 120 295  Diastolic 80 70 80  Pulse 89 76 113     Physical Exam  She looks systemically well.  Examination of her right ear shows inflammation in the right ear canal, typical of otitis externa.  The tympanic membrane also looks somewhat cloudy.     Assessment   1. Acute swimmer's ear of right side       Tests ordered No orders of the defined types were placed in this encounter.    Plan: 1.  I will treat her with neomycin/polymyxin/dexamethasone drops for the right ear canal and also Augmentin as the antibiotic orally of choice.  If she does not improve, she will let me know.    Meds ordered this encounter  Medications   NP THYROID 120 MG tablet    Sig: Take 1 tablet (120  mg total) by mouth daily before breakfast.    Dispense:  30 tablet    Refill:  3   progesterone (PROMETRIUM) 200 MG capsule    Sig: Take 1 capsule (200 mg total) by mouth at bedtime.    Dispense:  30 capsule    Refill:  3   neomycin-polymyxin b-dexamethasone (MAXITROL) 3.5-10000-0.1 SUSP    Sig: Place 2 drops into the right eye every 6 (six) hours.    Dispense:  5 mL    Refill:  0   amoxicillin-clavulanate (AUGMENTIN) 875-125 MG tablet    Sig: Take 1 tablet by mouth 2 (two) times daily.    Dispense:  20 tablet    Refill:  0    Lerlene Treadwell Normajean Glasgow, MD

## 2020-08-16 NOTE — Progress Notes (Signed)
Was working on a family pool. And she thinks this is what it came from. It started hurting on Saturday.

## 2020-08-19 ENCOUNTER — Encounter: Payer: Self-pay | Admitting: Psychiatry

## 2020-08-19 ENCOUNTER — Other Ambulatory Visit: Payer: Self-pay

## 2020-08-19 ENCOUNTER — Telehealth (INDEPENDENT_AMBULATORY_CARE_PROVIDER_SITE_OTHER): Payer: Medicaid Other | Admitting: Psychiatry

## 2020-08-19 DIAGNOSIS — F330 Major depressive disorder, recurrent, mild: Secondary | ICD-10-CM | POA: Diagnosis not present

## 2020-08-19 MED ORDER — VENLAFAXINE HCL ER 150 MG PO CP24: ORAL_CAPSULE | ORAL | 1 refills | Status: DC

## 2020-08-19 MED ORDER — VENLAFAXINE HCL ER 75 MG PO CP24: ORAL_CAPSULE | ORAL | 1 refills | Status: DC

## 2020-08-19 NOTE — Patient Instructions (Signed)
1. Continue venlafaxine 225 mg daily  2. Next appointment- 9/13 at 4 PM

## 2020-08-20 DIAGNOSIS — F331 Major depressive disorder, recurrent, moderate: Secondary | ICD-10-CM | POA: Diagnosis not present

## 2020-09-03 DIAGNOSIS — F331 Major depressive disorder, recurrent, moderate: Secondary | ICD-10-CM | POA: Diagnosis not present

## 2020-09-08 ENCOUNTER — Ambulatory Visit (INDEPENDENT_AMBULATORY_CARE_PROVIDER_SITE_OTHER): Payer: Medicaid Other | Admitting: Internal Medicine

## 2020-09-16 ENCOUNTER — Other Ambulatory Visit: Payer: Self-pay

## 2020-09-16 ENCOUNTER — Ambulatory Visit (INDEPENDENT_AMBULATORY_CARE_PROVIDER_SITE_OTHER): Payer: Medicaid Other | Admitting: Nurse Practitioner

## 2020-09-16 VITALS — BP 101/62 | HR 88 | Temp 97.5°F | Resp 18 | Ht 65.0 in | Wt 172.2 lb

## 2020-09-16 DIAGNOSIS — L659 Nonscarring hair loss, unspecified: Secondary | ICD-10-CM

## 2020-09-16 DIAGNOSIS — F17200 Nicotine dependence, unspecified, uncomplicated: Secondary | ICD-10-CM | POA: Diagnosis not present

## 2020-09-16 DIAGNOSIS — R5383 Other fatigue: Secondary | ICD-10-CM

## 2020-09-16 DIAGNOSIS — R5381 Other malaise: Secondary | ICD-10-CM

## 2020-09-16 DIAGNOSIS — E559 Vitamin D deficiency, unspecified: Secondary | ICD-10-CM

## 2020-09-16 MED ORDER — NICOTINE 21-14-7 MG/24HR TD KIT: 1.0000 | PACK | Freq: Every day | TRANSDERMAL | 0 refills | Status: DC

## 2020-09-16 NOTE — Progress Notes (Signed)
Subjective:  Patient ID: Catherine Villarreal, female    DOB: 17-Sep-1990  Age: 30 y.o. MRN: [id number]  CC:  Chief Complaint  Patient presents with   Fatigue   Other    Vitamin D deficiency, smoking cessation       HPI  This patient arrives today for the above.  Fatigue: She is currently on desiccated thyroid for the off label treatment of her fatigue.  She tells me since starting this medication and since upping her dosage to 60 mg twice a day at last office visit her symptoms are better.  She still does have fatigue but they are much improved.  She would like to further titrate her dose of NP thyroid if possible.  She denies any significant cardiac palpitations, tremors, or insomnia.  Vitamin D deficiency: She also is on vitamin D3 10,000 IUs daily.  Last serum check showed a serum level of 17.  She is due to have this rechecked today.  Smoking cessation: She is interested in quitting smoking and would like to discuss this today.  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   Caffeine- coffee, energy drink daily.   Social History   Tobacco Use   Smoking status: Every Day    Packs/day: 0.75    Types: Cigarettes   Smokeless tobacco: Never  Substance Use Topics   Alcohol use: Not Currently    Comment: quit Oct 2021     Current Meds  Medication Sig   acetaminophen (TYLENOL) 500 MG tablet Take 1,000 mg by mouth every 6 (six) hours as needed.    Cholecalciferol (VITAMIN D-3) 125 MCG (5000 UT) TABS Take 2 tablets by mouth daily at 12 noon.   Ibuprofen 200 MG CAPS Take 800 mg by mouth as needed.   Nicotine 21-14-7 MG/24HR KIT Place 1 patch onto the skin daily.   NP THYROID 120 MG tablet Take 1 tablet (120 mg total) by mouth daily before breakfast.   paragard intrauterine copper IUD IUD 1 each by Intrauterine route once.   progesterone (PROMETRIUM) 200 MG capsule Take 1 capsule (200 mg total) by mouth at bedtime.   [START ON 09/17/2020] venlafaxine XR (EFFEXOR XR) 75 MG 24 hr capsule 225 mg daily (take along with 150 mg cap)   [START ON 09/17/2020] venlafaxine XR (EFFEXOR-XR) 150 MG 24 hr capsule 225 mg daily. Take along with 75 mg cap    ROS:  Review of Systems  Constitutional:  Negative for malaise/fatigue (improved since starting the NP thyroid).  Respiratory:  Negative for shortness of breath.   Cardiovascular:  Negative for chest pain and palpitations (does feel a fast heart rate intermittently at night when she takes her effexor).  Skin:        (+) hair loss  Neurological:  Positive  for tremors (believes this is from the effexor as symptosm occured prior to NP thyroid).  Psychiatric/Behavioral:  The patient does not have insomnia.     Objective:   Today's Vitals: BP 101/62   Pulse 88   Temp (!) 97.5 F (36.4 C)   Resp 18   Ht _0  (1.651 m)   Wt 172 lb 3.2 oz (78.1 kg)   SpO2 99%   BMI 28.66 kg/m  Vitals with BMI 09/16/2020 08/16/2020 08/04/2020  Height _1  _2  _3   Weight 172 lbs 3 oz 172 lbs 10 oz 172 lbs  BMI 28.66 65.46 50.35  Systolic 465 681 275  Diastolic 62 80 70  Pulse 88 89 76     Physical Exam Vitals reviewed.  Constitutional:      General: She is not in acute distress.    Appearance: Normal appearance.  HENT:     Head: Normocephalic and atraumatic.  Neck:     Vascular: No carotid bruit.  Cardiovascular:     Rate and Rhythm: Normal rate and regular rhythm.     Pulses: Normal pulses.      Heart sounds: Normal heart sounds.  Pulmonary:     Effort: Pulmonary effort is normal.     Breath sounds: Normal breath sounds.  Skin:    General: Skin is warm and dry.  Neurological:     General: No focal deficit present.     Mental Status: She is alert and oriented to person, place, and time.  Psychiatric:        Mood and Affect: Mood normal.        Behavior: Behavior normal.        Judgment: Judgment normal.         Assessment and Plan   1. Hair loss   2. Vitamin D deficiency disease   3. Malaise and fatigue   4. Nicotine dependence, uncomplicated, unspecified nicotine product type      Plan: 1, 3.  We will check thyroid panel today for further evaluation.  Further recommendations were made based upon these results. 2.  We will check a serum vitamin D level today. 4.  We did discuss smoking cessation options.  She tells me Wellbutrin has not worked for her in the past so she does not want try this 1.  She is open to trying nicotine patches if covered by her insurance.  I will send prescription today to see if this will be covered.  We did discuss that she is to change the patch every 24 hours and rotate sites where the patch is applied.  May consider Chantix if patches or not covered by insurance.   Tests ordered Orders Placed This Encounter  Procedures   TSH   T3, Free   T4, Free   Vitamin D, 25-hydroxy   CMP with eGFR([facility])       Meds ordered this encounter  Medications   Nicotine 21-14-7 MG/24HR KIT    Sig: Place 1 patch onto the skin daily.    Dispense:  1 kit    Refill:  0    Order Specific Question:   Supervising Provider    Answer:   Doree Albee [[id number]]     Patient to follow-up in 2 months with Dr. Anastasio Champion or sooner as needed.  Ailene Ards, NP

## 2020-09-17 DIAGNOSIS — F331 Major depressive disorder, recurrent, moderate: Secondary | ICD-10-CM | POA: Diagnosis not present

## 2020-09-17 LAB — COMPLETE METABOLIC PANEL WITH GFR
AG Ratio: 1.7 (calc) (ref 1.0–2.5)
ALT: 19 U/L (ref 6–29)
AST: 23 U/L (ref 10–30)
Albumin: 4.3 g/dL (ref 3.6–5.1)
Alkaline phosphatase (APISO): 81 U/L (ref 31–125)
BUN: 11 mg/dL (ref 7–25)
CO2: 26 mmol/L (ref 20–32)
Calcium: 9.7 mg/dL (ref 8.6–10.2)
Chloride: 105 mmol/L (ref 98–110)
Creat: 0.73 mg/dL (ref 0.50–0.96)
Globulin: 2.5 g/dL (calc) (ref 1.9–3.7)
Glucose, Bld: 78 mg/dL (ref 65–99)
Potassium: 4.2 mmol/L (ref 3.5–5.3)
Sodium: 138 mmol/L (ref 135–146)
Total Bilirubin: 0.4 mg/dL (ref 0.2–1.2)
Total Protein: 6.8 g/dL (ref 6.1–8.1)
eGFR: 114 mL/min/{1.73_m2} (ref >=60)

## 2020-09-17 LAB — VITAMIN D 25 HYDROXY (VIT D DEFICIENCY, FRACTURES): Vit D, 25-Hydroxy: 84 ng/mL (ref 30–100)

## 2020-09-17 LAB — TSH: TSH: 0.01 mIU/L — ABNORMAL LOW

## 2020-09-17 LAB — T4, FREE: Free T4: 1 ng/dL (ref 0.8–1.8)

## 2020-09-17 LAB — T3, FREE: T3, Free: 3.5 pg/mL (ref 2.3–4.2)

## 2020-09-19 ENCOUNTER — Ambulatory Visit: Payer: Medicaid Other | Attending: Neurology

## 2020-09-19 DIAGNOSIS — R0681 Apnea, not elsewhere classified: Secondary | ICD-10-CM | POA: Diagnosis not present

## 2020-09-19 DIAGNOSIS — R0683 Snoring: Secondary | ICD-10-CM | POA: Insufficient documentation

## 2020-09-19 DIAGNOSIS — G4730 Sleep apnea, unspecified: Secondary | ICD-10-CM | POA: Diagnosis not present

## 2020-09-19 DIAGNOSIS — G4733 Obstructive sleep apnea (adult) (pediatric): Secondary | ICD-10-CM | POA: Diagnosis present

## 2020-09-30 ENCOUNTER — Telehealth: Payer: Self-pay | Admitting: Psychiatry

## 2020-09-30 NOTE — Telephone Encounter (Signed)
Reviewed the report from sleep med, signed on 09/30/2020. AHI was 1.5. Specific therapy for OSA is not indicated according to the report.

## 2020-10-01 ENCOUNTER — Other Ambulatory Visit: Payer: Self-pay

## 2020-10-08 DIAGNOSIS — F331 Major depressive disorder, recurrent, moderate: Secondary | ICD-10-CM | POA: Diagnosis not present

## 2020-10-13 DIAGNOSIS — H6091 Unspecified otitis externa, right ear: Secondary | ICD-10-CM | POA: Diagnosis not present

## 2020-10-14 ENCOUNTER — Telehealth: Payer: Self-pay

## 2020-10-14 NOTE — Telephone Encounter (Signed)
Transition Care Management Follow-up Telephone Call Date of discharge and from where: 10/13/2020-[facility]  How have you been since you were released from the hospital? Patient stated she is ok but still having some pain.  Any questions or concerns? No  Items Reviewed: Did the pt receive and understand the discharge instructions provided? Yes  Medications obtained and verified? Yes  Other? No  Any new allergies since your discharge? No  Dietary orders reviewed? N/A Do you have support at home? Yes   Home Care and Equipment/Supplies: Were home health services ordered? not applicable If so, what is the name of the agency? N/A  Has the agency set up a time to come to the patient's home? not applicable Were any new equipment or medical supplies ordered?  No What is the name of the medical supply agency? N/A Were you able to get the supplies/equipment? not applicable Do you have any questions related to the use of the equipment or supplies? No  Functional Questionnaire: (I = Independent and D = Dependent) ADLs: I  Bathing/Dressing- I  Meal Prep- I  Eating- I  Maintaining continence- I  Transferring/Ambulation- I  Managing Meds- I  Follow up appointments reviewed:  PCP Hospital f/u appt confirmed? No   Specialist Hospital f/u appt confirmed? No   Are transportation arrangements needed? No  If their condition worsens, is the pt aware to call PCP or go to the Emergency Dept.? Yes Was the patient provided with contact information for the PCP's office or ED? Yes Was to pt encouraged to call back with questions or concerns? Yes

## 2020-10-28 DIAGNOSIS — F331 Major depressive disorder, recurrent, moderate: Secondary | ICD-10-CM | POA: Diagnosis not present
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
## 2020-11-02 ENCOUNTER — Other Ambulatory Visit: Payer: Self-pay

## 2020-11-02 ENCOUNTER — Telehealth (INDEPENDENT_AMBULATORY_CARE_PROVIDER_SITE_OTHER): Payer: Medicaid Other | Admitting: Psychiatry

## 2020-11-02 ENCOUNTER — Encounter: Payer: Self-pay | Admitting: Psychiatry

## 2020-11-02 DIAGNOSIS — F330 Major depressive disorder, recurrent, mild: Secondary | ICD-10-CM

## 2020-11-02 MED ORDER — VENLAFAXINE HCL ER 75 MG PO CP24: ORAL_CAPSULE | ORAL | 2 refills | Status: DC

## 2020-11-02 MED ORDER — VENLAFAXINE HCL ER 150 MG PO CP24: ORAL_CAPSULE | ORAL | 5 refills | Status: DC

## 2020-11-02 NOTE — Patient Instructions (Signed)
1. Continue venlafaxine 225 mg daily  2. Next appointment- 12/8 at 1:20

## 2020-11-16 ENCOUNTER — Ambulatory Visit (INDEPENDENT_AMBULATORY_CARE_PROVIDER_SITE_OTHER): Payer: Medicaid Other | Admitting: Internal Medicine

## 2020-11-18 DIAGNOSIS — N200 Calculus of kidney: Secondary | ICD-10-CM | POA: Diagnosis not present

## 2021-01-04 ENCOUNTER — Telehealth: Payer: Medicaid Other | Admitting: Physician Assistant

## 2021-01-04 ENCOUNTER — Encounter: Payer: Self-pay | Admitting: Physician Assistant

## 2021-01-04 DIAGNOSIS — Z760 Encounter for issue of repeat prescription: Secondary | ICD-10-CM | POA: Diagnosis not present

## 2021-01-04 MED ORDER — PROGESTERONE 200 MG PO CAPS: 200.0000 mg | ORAL_CAPSULE | Freq: Every evening | ORAL | 0 refills | Status: DC

## 2021-01-04 MED ORDER — NP THYROID 120 MG PO TABS: 120.0000 mg | ORAL_TABLET | Freq: Every day | ORAL | 0 refills | Status: DC

## 2021-01-04 NOTE — Patient Instructions (Signed)
  Carren Rang, thank you for joining Leeanne Rio, PA-C for today's virtual visit.  While this provider is not your primary care provider (PCP), if your PCP is located in our provider database this encounter information will be shared with them immediately following your visit.  Consent: (Patient) Catherine Villarreal provided verbal consent for this virtual visit at the beginning of the encounter.  Current Medications:  Current Outpatient Medications:    acetaminophen (TYLENOL) 500 MG tablet, Take 1,000 mg by mouth every 6 (six) hours as needed., Disp: , Rfl:    Cholecalciferol (VITAMIN D-3) 125 MCG (5000 UT) TABS, Take 2 tablets by mouth daily at 12 noon., Disp: , Rfl:    Ibuprofen 200 MG CAPS, Take 800 mg by mouth as needed., Disp: , Rfl:    Nicotine 21-14-7 MG/24HR KIT, Place 1 patch onto the skin daily., Disp: 1 kit, Rfl: 0   NP THYROID 120 MG tablet, Take 1 tablet (120 mg total) by mouth daily before breakfast., Disp: 30 tablet, Rfl: 3   paragard intrauterine copper IUD IUD, 1 each by Intrauterine route once., Disp: , Rfl:    progesterone (PROMETRIUM) 200 MG capsule, Take 1 capsule (200 mg total) by mouth at bedtime., Disp: 30 capsule, Rfl: 3   venlafaxine XR (EFFEXOR XR) 75 MG 24 hr capsule, 225 mg daily (take along with 150 mg cap), Disp: 30 capsule, Rfl: 2   venlafaxine XR (EFFEXOR-XR) 150 MG 24 hr capsule, 225 mg daily. Take along with 75 mg cap, Disp: 30 capsule, Rfl: 5   Medications ordered in this encounter:  No orders of the defined types were placed in this encounter.    *If you need refills on other medications prior to your next appointment, please contact your pharmacy*  Follow-Up: Call back or seek an in-person evaluation if the symptoms worsen or if the condition fails to improve as anticipated.  Other Instructions Please restart your medications and follow-up with your new primary care provider as scheduled. We will not be able to give further fills of medication  without you being evaluated in person.    If you or a family member do not have a primary care provider, use the link below to schedule a visit and establish care. When you choose a [facility] primary care physician or advanced practice provider, you gain a long-term partner in health. Find a Primary Care Provider - Look into [facility] if there is any issue with your new practice, although I feel they will take great care of you.   Learn more about [facility]'s in-office and virtual care options: [facility]

## 2021-01-04 NOTE — Progress Notes (Signed)
Virtual Visit Consent   Catherine Villarreal, you are scheduled for a virtual visit with a [facility] provider today.     Just as with appointments in the office, your consent must be obtained to participate.  Your consent will be active for this visit and any virtual visit you may have with one of our providers in the next 365 days.     If you have a MyChart account, a copy of this consent can be sent to you electronically.  All virtual visits are billed to your insurance company just like a traditional visit in the office.    As this is a virtual visit, video technology does not allow for your provider to perform a traditional examination.  This may limit your provider's ability to fully assess your condition.  If your provider identifies any concerns that need to be evaluated in person or the need to arrange testing (such as labs, EKG, etc.), we will make arrangements to do so.     Although advances in technology are sophisticated, we cannot ensure that it will always work on either your end or our end.  If the connection with a video visit is poor, the visit may have to be switched to a telephone visit.  With either a video or telephone visit, we are not always able to ensure that we have a secure connection.     I need to obtain your verbal consent now.   Are you willing to proceed with your visit today?    Catherine Villarreal has provided verbal consent on 01/04/2021 for a virtual visit (video or telephone).   Leeanne Rio, [place]   Date: 01/04/2021 2:09 PM   Virtual Visit via Video Note   I, Leeanne Rio, connected with  Catherine Villarreal  ([id number], 1990-04-12) on 01/04/21 at  2:00 PM EST by a video-enabled telemedicine application and verified that I am speaking with the correct person using two identifiers.  Location: Patient: Virtual Visit Location Patient: Home Provider: Virtual Visit Location Provider: Home Office   I discussed the limitations of evaluation and management by  telemedicine and the availability of in person appointments. The patient expressed understanding and agreed to proceed.    History of Present Illness: Catherine Villarreal is a 30 y.o. who identifies as a female who was assigned female at birth, and is being seen today for possible medication refill. Her PCP, Dr. Anastasio Champion, recently passed and she is unable to get in with new PCP, [facility], on 01/10/2021. Has been NP Thyroid 120 mg for some time along with Progesterone 200 mg nightly. Has been out of medication for 2 weeks and started to note changes in mood, energy, etc. Contacted the pharmacy for emergency refill giving situation but they would not refill New PCP office would not fill until they had seen her for the first time.   HPI: HPI  Problems:  Patient Active Problem List   Diagnosis Date Noted   Recurrent nephrolithiasis 05/13/2020   Family history of breast cancer 01/30/2020   Presence of IUD 04/25/2018   Pap smear of cervix unsatisfactory 08/21/2017    Allergies:  Allergies  Allergen Reactions   Dilaudid [Hydromorphone Hcl]     migraine   Adhesive [Tape] Rash   Medications:  Current Outpatient Medications:    acetaminophen (TYLENOL) 500 MG tablet, Take 1,000 mg by mouth every 6 (six) hours as needed., Disp: , Rfl:    Cholecalciferol (VITAMIN D-3) 125 MCG (  5000 UT) TABS, Take 2 tablets by mouth daily at 12 noon., Disp: , Rfl:    Ibuprofen 200 MG CAPS, Take 800 mg by mouth as needed., Disp: , Rfl:    Nicotine 21-14-7 MG/24HR KIT, Place 1 patch onto the skin daily., Disp: 1 kit, Rfl: 0   NP THYROID 120 MG tablet, Take 1 tablet (120 mg total) by mouth daily before breakfast., Disp: 30 tablet, Rfl: 0   paragard intrauterine copper IUD IUD, 1 each by Intrauterine route once., Disp: , Rfl:    progesterone (PROMETRIUM) 200 MG capsule, Take 1 capsule (200 mg total) by mouth at bedtime., Disp: 30 capsule, Rfl: 0   venlafaxine XR (EFFEXOR XR) 75 MG 24 hr capsule, 225 mg daily  (take along with 150 mg cap), Disp: 30 capsule, Rfl: 2   venlafaxine XR (EFFEXOR-XR) 150 MG 24 hr capsule, 225 mg daily. Take along with 75 mg cap, Disp: 30 capsule, Rfl: 5  Observations/Objective: Patient is well-developed, well-nourished in no acute distress.  Resting comfortably at home.  Head is normocephalic, atraumatic.  No labored breathing. Speech is clear and coherent with logical content.  Patient is alert and oriented at baseline.   Assessment and Plan: 1. Encounter for medication refill - NP THYROID 120 MG tablet; Take 1 tablet (120 mg total) by mouth daily before breakfast.  Dispense: 30 tablet; Refill: 0 - progesterone (PROMETRIUM) 200 MG capsule; Take 1 capsule (200 mg total) by mouth at bedtime.  Dispense: 30 capsule; Refill: 0 Will give one-time fill of NP thyroid and Progesterone. Recent thyroid hormone levels in normal range with current dose of thyroid medication. She is aware we can only give a one-time fill and she will have to follow-up with her new PCP in 01/10/2021.  Follow Up Instructions: I discussed the assessment and treatment plan with the patient. The patient was provided an opportunity to ask questions and all were answered. The patient agreed with the plan and demonstrated an understanding of the instructions.  A copy of instructions were sent to the patient via MyChart unless otherwise noted below.   The patient was advised to call back or seek an in-person evaluation if the symptoms worsen or if the condition fails to improve as anticipated.  Time:  I spent 10 minutes with the patient via telehealth technology discussing the above problems/concerns.    Leeanne Rio, PA-C

## 2021-01-10 ENCOUNTER — Telehealth: Payer: Self-pay

## 2021-01-10 ENCOUNTER — Encounter: Payer: Self-pay | Admitting: Nurse Practitioner

## 2021-01-10 ENCOUNTER — Ambulatory Visit (INDEPENDENT_AMBULATORY_CARE_PROVIDER_SITE_OTHER): Payer: Medicaid Other | Admitting: Nurse Practitioner

## 2021-01-10 ENCOUNTER — Other Ambulatory Visit: Payer: Self-pay

## 2021-01-10 VITALS — BP 120/77 | HR 95 | Ht 67.0 in | Wt 184.0 lb

## 2021-01-10 DIAGNOSIS — F330 Major depressive disorder, recurrent, mild: Secondary | ICD-10-CM | POA: Insufficient documentation

## 2021-01-10 DIAGNOSIS — R21 Rash and other nonspecific skin eruption: Secondary | ICD-10-CM | POA: Diagnosis not present

## 2021-01-10 NOTE — Assessment & Plan Note (Signed)
Ulcer noted under right breast. I plan to prescribe bacitracin ointment use BID for 5days.

## 2021-01-10 NOTE — Assessment & Plan Note (Signed)
PT sees psychiatrist.  She stated that she wants to stop taking effexor. She stated that psych has refused to take her off effexor. PT refused referral to another pscy.  PT educated on the need for her not stop taking effexor abruptly.PT denies suicidal ideation. F/U with psyc regarding medication management.

## 2021-01-10 NOTE — Progress Notes (Signed)
   BRITANI BEATTIE     MRN: [id number]      DOB: 1990-06-15   HPI Ms. Faust is to establish care . Prior PCP was Dr Consuello Closs.   PT is up to date with her PAP smear. Has IUD[facility]   PT c/o of wound under her right breast .PT stated that she first noticed the wound 2 weeks ago and that she has used neosporin and tripple A but the wound is slow to healing.   Nicotine addiction. PT stated that she has been smoking one pack of cigarette a day since age 13. She has tried to use nicotine patch but they irritate her skin. PT not ready to quit smoking at this time. Stated that it helps her cope with her depression.   PT has not have her COVID vaccine, refused flu vaccine. She is upto date with TDAP vaccine.   Recurrent nephrolithiasis. Se sees urology at [facility] .   Depression; PT is seeing psych. Has been dealing with depression since 5 years ago after she gave gave birth to her son. PT denies suicidal ideation. She stated that her medications are not working well for her, she is still experiencing  depression despite attending counseling sessions and taking med. She stated that she told her psychiatrist to take her off medications but she will not. PT refused referral to another pscy. She reports that he forgets to take her medication sometimes and she does feel bad whenever she skips her medication.  PT stated that her former PCP has been treating her depression with progesterone and NP thyroid. PT does not have a diagnosis of hypothyroidism and I explained to PT that we can not continue to prescribe NP TYROID without a diagnosis of hypothyroidism. I offered to do referral for hormone treatment but PT refused.  I discussed reducing her NP thyroid to 90mg  daily and check TSH in 6 weeks but PT refused. PT became very tearful saying no one would listen to her. She stated that she does not want to receive care anymore at [facility]. [facility] had discussion  with PT about her plan of care but she insisted on leaving the clinic.     ROS Denies recent fever or chills. Denies sinus pressure, nasal congestion, ear pain or sore throat. Denies chest congestion, productive cough or wheezing. Denies chest pains, palpitations and leg swelling Denies abdominal pain, nausea, vomiting,diarrhea or constipation.   Denies dysuria, frequency, hesitancy or incontinence. Denies joint pain, swelling and limitation in mobility. Denies headaches, seizures, numbness, or tingling. PT c/o of uncontrolled depression, PT c/o of wound under her right breast.    PE  BP 120/77 (BP Location: Right Arm, Patient Position: Sitting, Cuff Size: Normal)   Pulse 95   Ht 5\' 7"  (1.702 m)   Wt 184 lb (83.5 kg)   SpO2 98%   BMI 28.82 kg/m   Patient alert and oriented and in no cardiopulmonary distress.  HEENT: No facial asymmetry, EOMI,     Neck supple .  Chest: Clear to auscultation bilaterally.  CVS: S1, S2 no murmurs, no S3.Regular rate.  ABD: Soft non tender.   Ext: No edema  MS: Adequate ROM spine, shoulders, hips and knees.  Skin: ulcerations noted under right breast   Psych: Good eye contact, PT is very tearful. Memory intact not anxious or depressed appearing.  Chaperone present during examination of breast.

## 2021-01-10 NOTE — Telephone Encounter (Signed)
Patient left the office today because she did not agree with the treatment plan Fola and Dr Allena Katz reviewed together.  She said she did not want to come back.  We did not make a follow up appt at [facility] and she said she will find another doctor.  Mitzi Davenport was with her for about 1.5 hours working with her for a treatment plan that could work for her that would be medically appropriate.  We will go ahead and remove [facility] from her PCP at the patients request today.
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
## 2021-01-25 ENCOUNTER — Emergency Department (HOSPITAL_COMMUNITY): Payer: Medicaid Other

## 2021-01-25 ENCOUNTER — Emergency Department (HOSPITAL_COMMUNITY)
Admission: EM | Admit: 2021-01-25 | Discharge: 2021-01-25 | Disposition: A | Discharge status: Home / Self Care | Payer: Medicaid Other | Attending: Emergency Medicine | Admitting: Emergency Medicine

## 2021-01-25 ENCOUNTER — Encounter (HOSPITAL_COMMUNITY): Payer: Self-pay

## 2021-01-25 ENCOUNTER — Other Ambulatory Visit: Payer: Self-pay

## 2021-01-25 DIAGNOSIS — F17210 Nicotine dependence, cigarettes, uncomplicated: Secondary | ICD-10-CM | POA: Diagnosis not present

## 2021-01-25 DIAGNOSIS — N9489 Other specified conditions associated with female genital organs and menstrual cycle: Secondary | ICD-10-CM | POA: Diagnosis not present

## 2021-01-25 DIAGNOSIS — R059 Cough, unspecified: Secondary | ICD-10-CM | POA: Insufficient documentation

## 2021-01-25 DIAGNOSIS — R000 Tachycardia, unspecified: Secondary | ICD-10-CM | POA: Insufficient documentation

## 2021-01-25 DIAGNOSIS — R0789 Other chest pain: Secondary | ICD-10-CM | POA: Diagnosis not present

## 2021-01-25 DIAGNOSIS — K760 Fatty (change of) liver, not elsewhere classified: Secondary | ICD-10-CM | POA: Diagnosis not present

## 2021-01-25 DIAGNOSIS — R079 Chest pain, unspecified: Secondary | ICD-10-CM | POA: Diagnosis not present

## 2021-01-25 LAB — BASIC METABOLIC PANEL
Anion gap: 7 (ref 5–15)
BUN: 9 mg/dL (ref 6–20)
CO2: 26 mmol/L (ref 22–32)
Calcium: 8.4 mg/dL — ABNORMAL LOW (ref 8.9–10.3)
Chloride: 103 mmol/L (ref 98–111)
Creatinine, Ser: 0.77 mg/dL (ref 0.44–1.00)
GFR, Estimated: >60 mL/min (ref >60)
Glucose, Bld: 161 mg/dL — ABNORMAL HIGH (ref 70–99)
Potassium: 3.8 mmol/L (ref 3.5–5.1)
Sodium: 136 mmol/L (ref 135–145)

## 2021-01-25 LAB — D-DIMER, QUANTITATIVE: D-Dimer, Quant: 0.54 ug/mL-FEU — ABNORMAL HIGH (ref 0.00–0.50)

## 2021-01-25 LAB — CBC WITH DIFFERENTIAL/PLATELET
Abs Immature Granulocytes: 0.05 10*3/uL (ref 0.00–0.07)
Basophils Absolute: 0 10*3/uL (ref 0.0–0.1)
Basophils Relative: 0 %
Eosinophils Absolute: 0 10*3/uL (ref 0.0–0.5)
Eosinophils Relative: 0 %
HCT: 40.7 % (ref 36.0–46.0)
Hemoglobin: 13.8 g/dL (ref 12.0–15.0)
Immature Granulocytes: 1 %
Lymphocytes Relative: 27 %
Lymphs Abs: 2.8 10*3/uL (ref 0.7–4.0)
MCH: 30.8 pg (ref 26.0–34.0)
MCHC: 33.9 g/dL (ref 30.0–36.0)
MCV: 90.8 fL (ref 80.0–100.0)
Monocytes Absolute: 0.9 10*3/uL (ref 0.1–1.0)
Monocytes Relative: 9 %
Neutro Abs: 6.6 10*3/uL (ref 1.7–7.7)
Neutrophils Relative %: 63 %
Platelets: 267 10*3/uL (ref 150–400)
RBC: 4.48 MIL/uL (ref 3.87–5.11)
RDW: 12.6 % (ref 11.5–15.5)
WBC: 10.4 10*3/uL (ref 4.0–10.5)
nRBC: 0 % (ref 0.0–0.2)

## 2021-01-25 LAB — I-STAT BETA HCG BLOOD, ED (MC, WL, AP ONLY): I-stat hCG, quantitative: <5 m[IU]/mL (ref <5)

## 2021-01-25 LAB — TROPONIN I (HIGH SENSITIVITY)
Troponin I (High Sensitivity): <2 ng/L (ref <18)
Troponin I (High Sensitivity): <2 ng/L (ref <18)

## 2021-01-25 MED ORDER — IOHEXOL 350 MG/ML SOLN
100.0000 mL | Freq: Once | INTRAVENOUS | Status: AC | PRN
  Administered 2021-01-25: 100 mL via INTRAVENOUS

## 2021-01-25 NOTE — ED Triage Notes (Signed)
Pt says has had pain in center of chest daily since thanksgiving with movement.  Denies injury.  Says has had a slight dry cough but nothing consistent.  Denies any recent heavy lifting or pulling.

## 2021-01-25 NOTE — ED Provider Notes (Signed)
[facility] EMERGENCY DEPARTMENT Provider Note   CSN: [id number] Arrival date & time: 01/25/21  [id number]     History Chief Complaint  Patient presents with   Chest Pain    Catherine Villarreal is a 30 y.o. female.  With past medical history of anxiety and depression who presents to the emergency department with chest pain.  She states that she has had gradual onset of progressively worsening chest pain since Thanksgiving.  She describes the chest pain is central, nonradiating.  She states that it is sharp, stabbing pain with movement or deep breaths however when she is sitting still it is dull pain.  She does endorse palpitations however she states that she has palpitations at baseline and they do not feel worsened.  She states that she " feels like she needs to breathe deeper," but denies shortness of breath.  Denies lower extremity swelling.  She endorses minimal dry cough, denies hemoptysis, fevers, sore throat or upper respiratory symptoms.  She does use progesterone and smokes tobacco.  Denies any recent surgeries, immobilization.  She endorses a 3-hour drive on Thanksgiving however no recent flights.   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   Family history of breast cancer 01/30/2020   Presence of IUD 04/25/2018   Pap smear of cervix unsatisfactory 08/21/2017    Past Surgical History:  Procedure Laterality Date   CHOLECYSTECTOMY  2018     OB History     Gravida  2   Para  1   Term  1   Preterm  0   AB  1   Living   1      SAB  1   IAB  0   Ectopic  0   Multiple  0   Live Births  1           Family History  Problem Relation Age of Onset   Cancer Mother    Breast cancer Mother    Thyroid cancer Mother    Other Mother    Thyroid disease Mother    Berenice Primas' disease Mother    Drug abuse Brother    Stroke Father    Thyroid disease Maternal Aunt    Thyroid disease Maternal Grandmother     Social History   Tobacco Use   Smoking status: Every Day    Packs/day: 0.75    Types: Cigarettes   Smokeless tobacco: Never  Vaping Use   Vaping Use: Never used  Substance Use Topics   Alcohol use: Not Currently    Comment: quit Oct 2021   Drug use: Never    Home Medications Prior to Admission medications   Medication Sig Start Date End Date Taking? Authorizing Provider  acetaminophen (TYLENOL) 500 MG tablet Take 1,000 mg by mouth every 6 (six) hours as needed.    [provider]  Cholecalciferol (VITAMIN D-3) 125 MCG (5000 UT) TABS Take 2 tablets by mouth daily at 12 noon.    [provider]  Ibuprofen 200 MG CAPS Take 800 mg by mouth as needed.    [provider]  Multiple Vitamins-Minerals (WOMENS MULTI GUMMIES PO) Take by mouth. Take 2 daily    [provider]  NP THYROID 120 MG tablet Take 1 tablet (120 mg total) by mouth daily before breakfast. 01/04/21   Brunetta Jeans, PA-C  paragard intrauterine copper IUD IUD 1 each by Intrauterine route once.    [provider]  progesterone (PROMETRIUM) 200 MG capsule Take 1 capsule (200 mg total) by mouth at bedtime. 01/04/21   Brunetta Jeans, PA-C  venlafaxine XR (EFFEXOR XR) 75 MG 24 hr capsule 225 mg daily (take along with 150 mg cap) 11/18/20   Hisada, Elie Goody, MD  venlafaxine XR (EFFEXOR-XR) 150 MG 24 hr capsule 225 mg daily. Take along with 75 mg cap 11/18/20   Norman Clay, MD    Allergies    Dilaudid [hydromorphone hcl] and Adhesive [tape]  Review of Systems   Review of Systems   Constitutional:  Negative for diaphoresis and fever.  HENT:  Negative for sore throat.   Respiratory:  Positive for cough. Negative for shortness of breath.   Cardiovascular:  Positive for chest pain and palpitations. Negative for leg swelling.  Gastrointestinal:  Negative for nausea and vomiting.  Neurological:  Negative for syncope.  All other systems reviewed and are negative.  Physical Exam Updated Vital Signs BP 119/74 (BP Location: Right Arm)   Pulse 96   Temp 97.9 F (36.6 C) (Oral)   Resp 16   Ht 5\' 7"  (1.702 m)   Wt 83.5 kg   SpO2 99%   BMI 28.82 kg/m   Physical Exam Vitals and nursing note reviewed.  Constitutional:      General: She is not in acute distress.    Appearance: Normal appearance. She is well-developed. She is not toxic-appearing.  HENT:     Head: Normocephalic and atraumatic.  Eyes:     General: No scleral icterus.    Extraocular Movements: Extraocular movements intact.     Pupils: Pupils are equal, round, and reactive to light.  Neck:     Vascular: No JVD.  Cardiovascular:     Rate and Rhythm: Regular rhythm. Tachycardia present.     Pulses:          Radial pulses are 2+ on the right side and 2+ on the left side.     Heart sounds: Normal heart sounds. No murmur heard. Pulmonary:     Effort: Pulmonary effort is normal. No tachypnea or respiratory distress.     Breath sounds: Normal breath sounds.  Chest:     Chest wall: Tenderness present.  Abdominal:     General: Bowel sounds are normal.     Palpations: Abdomen is soft.  Musculoskeletal:     Cervical back: Normal range of motion and neck supple.     Right lower leg: No tenderness. No edema.     Left lower leg: No tenderness. No edema.  Skin:    General: Skin is warm and dry.     Capillary Refill: Capillary refill takes less than 2 seconds.  Neurological:     General: No focal deficit present.     Mental Status: She is alert.  Psychiatric:        Mood and Affect: Mood normal.         Behavior: Behavior normal.    ED Results / Procedures / Treatments   Labs (all labs ordered  are listed, but only abnormal results are displayed) Labs Reviewed  D-DIMER, QUANTITATIVE - Abnormal; Notable for the following components:      Result Value   D-Dimer, Quant 0.54 (*)    All other components within normal limits  BASIC METABOLIC PANEL - Abnormal; Notable for the following components:   Glucose, Bld 161 (*)    Calcium 8.4 (*)    All other components within normal limits  CBC WITH DIFFERENTIAL/PLATELET  I-STAT BETA HCG BLOOD, ED ([facility], [facility], [facility] ONLY)  TROPONIN I (HIGH SENSITIVITY)  TROPONIN I (HIGH SENSITIVITY)    EKG EKG Interpretation  Date/Time:  Tuesday January 25 2021 09:47:04 EST Ventricular Rate:  96 PR Interval:  184 QRS Duration: 71 QT Interval:  341 QTC Calculation: 431 R Axis:   93 Text Interpretation: Sinus rhythm Consider left atrial enlargement Borderline right axis deviation Minimal ST depression similar to 2018 Confirmed by Pricilla Loveless [phone]) on 01/25/2021 9:53:29 AM  Radiology DG Chest 2 View  Result Date: 01/25/2021 CLINICAL DATA:  Chest pain EXAM: CHEST - 2 VIEW COMPARISON:  01/02/2017 FINDINGS: Cardiac size is within normal limits. Low position of diaphragms suggests normal variation due to patient's body habitus or suggest air trapping. There is interval clearing of infiltrate in the medial right lower lung fields. There are no signs of alveolar pulmonary edema or focal pulmonary consolidation. There is no significant pleural effusion or pneumothorax. IMPRESSION: No active cardiopulmonary disease. Electronically Signed   By: Ernie Avena M.D.   On: 01/25/2021 10:51   CT Angio Chest PE W/Cm &/Or Wo Cm  Result Date: 01/25/2021 CLINICAL DATA:  Chest pain, cough EXAM: CT ANGIOGRAPHY CHEST WITH CONTRAST TECHNIQUE: Multidetector CT imaging of the chest was performed using the standard protocol during bolus administration of intravenous contrast.  Multiplanar CT image reconstructions and MIPs were obtained to evaluate the vascular anatomy. CONTRAST:  OMNIPAQUE IOHEXOL 350 MG/ML SOLN COMPARISON:  Chest radiograph done earlier today FINDINGS: Cardiovascular: Unremarkable. There is homogeneous enhancement in thoracic aorta. There are no intraluminal filling defects in the pulmonary artery branches. Mediastinum/Nodes: No significant lymphadenopathy seen. Lungs/Pleura: There is no focal pulmonary consolidation. Blebs and bullae are seen in the lower lung fields. There is no pleural effusion or pneumothorax. Upper Abdomen: There is fatty infiltration in the liver. Spleen is enlarged in size. Musculoskeletal: Unremarkable Review of the MIP images confirms the above findings. IMPRESSION: There is no evidence of pulmonary artery embolism. There is no evidence of thoracic aortic dissection. There is no focal pulmonary consolidation. Fatty liver.  Enlarged spleen. Electronically Signed   By: Ernie Avena M.D.   On: 01/25/2021 11:51    Procedures Procedures   Medications Ordered in ED Medications  iohexol (OMNIPAQUE) 350 MG/ML injection 100 mL (100 mLs Intravenous Contrast Given 01/25/21 1139)   ED Course  I have reviewed the triage vital signs and the nursing notes.  Pertinent labs & imaging results that were available during my care of the patient were reviewed by me and considered in my medical decision making (see chart for details).    MDM Rules/Calculators/A&P 30 year old female who presents to the emergency department with chest pain.  Exam without evidence of fluid volume overload so doubt heart failure.  EKG without signs of ischemia or infarction.  She does have some minimal ST depressions however they appear similar to previous. Troponin x2 negative, doubt ACS as a component of her chest pain. Chest x-ray without evidence of pneumonia or pneumothorax.  Her presentation is not  consistent with aortic dissection, pericarditis,  tamponade, myocarditis. Her D-dimer was positive at 0.54.  Obtain CTA of her chest which showed no dissection or pulmonary artery embolism.  Given thorough work-up unclear etiology of her chest pain, however she has had a reassuring work-up here in the emergency department.  I have instructed her to follow-up with a primary care provider for ongoing management of her thyroid medication.  Doubt this is contributing to her chest pain.  She has no evidence of thyrotoxicosis or myxedema coma at this time.  Still possible this could be costochondritis versus musculoskeletal strain.  We will have her follow-up with primary care.  She verbalizes understanding of discharge instructions.  Her vital signs are stable. Safe for discharge Final Clinical Impression(s) / ED Diagnoses Final diagnoses:  Chest wall pain    Rx / DC Orders ED Discharge Orders     None        Mickie Hillier, PA-C 01/25/21 1511    Sherwood Gambler, MD 01/27/21 [phone]

## 2021-01-25 NOTE — Discharge Instructions (Addendum)
He was seen in the department a day for chest pain.  While you were here we did a thorough work-up which was negative for any acute or emergent findings.  It is likely that you are having some chest wall pain you may use ibuprofen 800 mg every 8 hours as needed for pain.  Please return to the emergency department if you begin having chest pain associated with nausea, diaphoresis, lightheadedness or dizziness, shortness of breath.

## 2021-01-26 ENCOUNTER — Telehealth: Payer: Self-pay

## 2021-01-26 NOTE — Telephone Encounter (Signed)
Transition Care Management Unsuccessful Follow-up Telephone Call  Date of discharge and from where:  01/25/2021 from [facility]  Attempts:  1st Attempt  Reason for unsuccessful TCM follow-up call:  Left voice message

## 2021-01-27 ENCOUNTER — Other Ambulatory Visit: Payer: Self-pay

## 2021-01-27 ENCOUNTER — Encounter: Payer: Self-pay | Admitting: Psychiatry

## 2021-01-27 ENCOUNTER — Telehealth: Payer: Self-pay

## 2021-01-27 ENCOUNTER — Telehealth (INDEPENDENT_AMBULATORY_CARE_PROVIDER_SITE_OTHER): Payer: Medicaid Other | Admitting: Psychiatry

## 2021-01-27 DIAGNOSIS — F330 Major depressive disorder, recurrent, mild: Secondary | ICD-10-CM | POA: Diagnosis not present

## 2021-01-27 MED ORDER — VENLAFAXINE HCL ER 75 MG PO CP24: 225.0000 mg | ORAL_CAPSULE | Freq: Every day | ORAL | 2 refills | Status: DC

## 2021-01-27 NOTE — Telephone Encounter (Signed)
Noted, thanks!

## 2021-01-27 NOTE — Patient Instructions (Addendum)
1. Continue venlafaxine 225 mg daily  2. Next appointment- 3/7 at 1 PM, video

## 2021-01-27 NOTE — Telephone Encounter (Signed)
Transition Care Management Follow-up Telephone Call Date of discharge and from where: 01/25/2021 from [facility] How have you been since you were released from the hospital? Pt stated that she still experiences the chest pain when she moves around.  Any questions or concerns? No  Items Reviewed: Did the pt receive and understand the discharge instructions provided? Yes  Medications obtained and verified? Yes  Other? No  Any new allergies since your discharge? No  Dietary orders reviewed? No Do you have support at home? Yes   Functional Questionnaire: (I = Independent and D = Dependent) ADLs: I Bathing/Dressing- I Meal Prep- I Eating- I Maintaining continence- I Transferring/Ambulation- I Managing Meds- I   Follow up appointments reviewed:  PCP Hospital f/u appt confirmed? No  Pt stated that she has an appt to establish with a new provider.  Specialist Hospital f/u appt confirmed? No   Are transportation arrangements needed? No  If their condition worsens, is the pt aware to call PCP or go to the Emergency Dept.? Yes Was the patient provided with contact information for the PCP's office or ED? Yes Was to pt encouraged to call back with questions or concerns? Yes

## 2021-01-27 NOTE — Telephone Encounter (Signed)
faxed and confirmed discontinue medication order for the venlafaxine

## 2021-02-14 ENCOUNTER — Telehealth: Payer: Medicaid Other | Admitting: Nurse Practitioner

## 2021-02-14 DIAGNOSIS — J069 Acute upper respiratory infection, unspecified: Secondary | ICD-10-CM | POA: Diagnosis not present

## 2021-02-14 NOTE — Progress Notes (Signed)
E-Visit for Upper Respiratory Infection   We are sorry you are not feeling well.  Here is how we plan to help!  Based on what you have shared with me, it looks like you may have a viral upper respiratory infection.  Upper respiratory infections are caused by a large number of viruses; however, rhinovirus is the most common cause. COVID-19 is also a reason you may be having these symptoms. We would recommend taking a home test if possible.  Providers prescribe antibiotics to treat infections caused by bacteria. Antibiotics are very powerful in treating bacterial infections when they are used properly. To maintain their effectiveness, they should be used only when necessary. Overuse of antibiotics has resulted in the development of superbugs that are resistant to treatment!    After careful review of your answers, I would not recommend an antibiotic for your condition.  Antibiotics are not effective against viruses and therefore should not be used to treat them. Common examples of infections caused by viruses include colds and flu   Typically antibiotics are not warranted until symptoms have been present for over one week.   Symptoms vary from person to person, with common symptoms including sore throat, cough, fatigue or lack of energy and feeling of general discomfort.  A low-grade fever of up to 100.4 may present, but is often uncommon.  Symptoms vary however, and are closely related to a person's age or underlying illnesses.  The most common symptoms associated with an upper respiratory infection are nasal discharge or congestion, cough, sneezing, headache and pressure in the ears and face.  These symptoms usually persist for about 3 to 10 days, but can last up to 2 weeks.  It is important to know that upper respiratory infections do not cause serious illness or complications in most cases.    Upper respiratory infections can be transmitted from person to person, with the most common method of  transmission being a person's hands.  The virus is able to live on the skin and can infect other persons for up to 2 hours after direct contact.  Also, these can be transmitted when someone coughs or sneezes; thus, it is important to cover the mouth to reduce this risk.  To keep the spread of the illness at bay, good hand hygiene is very important.  This is an infection that is most likely caused by a virus. There are no specific treatments other than to help you with the symptoms until the infection runs its course.  We are sorry you are not feeling well.  Here is how we plan to help!   For nasal congestion, you may use an oral decongestants such as Mucinex D or if you have glaucoma or high blood pressure use plain Mucinex.  Saline nasal spray or nasal drops can help and can safely be used as often as needed for congestion.    If you do not have a history of heart disease, hypertension, diabetes or thyroid disease, prostate/bladder issues or glaucoma, you may also use Sudafed to treat nasal congestion.  It is highly recommended that you consult with a pharmacist or your primary care physician to ensure this medication is safe for you to take.     If you have a cough, you may use cough suppressants such as Delsym and Robitussin.  If you have glaucoma or high blood pressure, you can also use Coricidin HBP.     If you have a sore or scratchy throat, use a saltwater gargle-  to  teaspoon of salt dissolved in a 4-ounce to 8-ounce glass of warm water.  Gargle the solution for approximately 15-30 seconds and then spit.  It is important not to swallow the solution.  You can also use throat lozenges/cough drops and Chloraseptic spray to help with throat pain or discomfort.  Warm or cold liquids can also be helpful in relieving throat pain.  For headache, pain or general discomfort, you can use Ibuprofen or Tylenol as directed.   Some authorities believe that zinc sprays or the use of Echinacea may shorten  the course of your symptoms.   HOME CARE Only take medications as instructed by your medical team. Be sure to drink plenty of fluids. Water is fine as well as fruit juices, sodas and electrolyte beverages. You may want to stay away from caffeine or alcohol. If you are nauseated, try taking small sips of liquids. How do you know if you are getting enough fluid? Your urine should be a pale yellow or almost colorless. Get rest. Taking a steamy shower or using a humidifier may help nasal congestion and ease sore throat pain. You can place a towel over your head and breathe in the steam from hot water coming from a faucet. Using a saline nasal spray works much the same way. Cough drops, hard candies and sore throat lozenges may ease your cough. Avoid close contacts especially the very young and the elderly Cover your mouth if you cough or sneeze Always remember to wash your hands.   GET HELP RIGHT AWAY IF: You develop worsening fever. If your symptoms do not improve within 10 days You develop yellow or green discharge from your nose over 3 days. You have coughing fits You develop a severe head ache or visual changes. You develop shortness of breath, difficulty breathing or start having chest pain Your symptoms persist after you have completed your treatment plan  MAKE SURE YOU  Understand these instructions. Will watch your condition. Will get help right away if you are not doing well or get worse.  Thank you for choosing an e-visit.  Your e-visit answers were reviewed by a board certified advanced clinical practitioner to complete your personal care plan. Depending upon the condition, your plan could have included both over the counter or prescription medications.  Please review your pharmacy choice. Make sure the pharmacy is open so you can pick up prescription now. If there is a problem, you may contact your provider through [employer] and have the prescription routed to another  pharmacy.  Your safety is important to [place]. If you have drug allergies check your prescription carefully.   For the next 24 hours you can use MyChart to ask questions about today's visit, request a non-urgent call back, or ask for a work or school excuse. You will get an email in the next two days asking about your experience. I hope that your e-visit has been valuable and will speed your recovery.  I spent approximately 7 minutes reviewing the patient's history, current symptoms and coordinating their plan of care today.

## 2021-03-01 ENCOUNTER — Other Ambulatory Visit: Payer: Self-pay

## 2021-03-01 ENCOUNTER — Ambulatory Visit (INDEPENDENT_AMBULATORY_CARE_PROVIDER_SITE_OTHER): Payer: Medicaid Other | Admitting: Internal Medicine

## 2021-03-01 ENCOUNTER — Encounter: Payer: Self-pay | Admitting: Internal Medicine

## 2021-03-01 VITALS — BP 122/76 | HR 90 | Temp 97.9°F | Resp 16 | Ht 67.0 in | Wt 192.0 lb

## 2021-03-01 DIAGNOSIS — R739 Hyperglycemia, unspecified: Secondary | ICD-10-CM | POA: Insufficient documentation

## 2021-03-01 DIAGNOSIS — R5382 Chronic fatigue, unspecified: Secondary | ICD-10-CM | POA: Insufficient documentation

## 2021-03-01 DIAGNOSIS — E781 Pure hyperglyceridemia: Secondary | ICD-10-CM | POA: Insufficient documentation

## 2021-03-01 DIAGNOSIS — N920 Excessive and frequent menstruation with regular cycle: Secondary | ICD-10-CM | POA: Insufficient documentation

## 2021-03-01 LAB — HEPATIC FUNCTION PANEL
ALT: 24 U/L (ref 0–35)
AST: 24 U/L (ref 0–37)
Albumin: 4.3 g/dL (ref 3.5–5.2)
Alkaline Phosphatase: 78 U/L (ref 39–117)
Bilirubin, Direct: 0.1 mg/dL (ref 0.0–0.3)
Total Bilirubin: 0.3 mg/dL (ref 0.2–1.2)
Total Protein: 7.1 g/dL (ref 6.0–8.3)

## 2021-03-01 LAB — HEMOGLOBIN A1C: Hgb A1c MFr Bld: 5.8 % (ref 4.6–6.5)

## 2021-03-01 LAB — BASIC METABOLIC PANEL
BUN: 8 mg/dL (ref 6–23)
CO2: 26 mEq/L (ref 19–32)
Calcium: 9.3 mg/dL (ref 8.4–10.5)
Chloride: 104 mEq/L (ref 96–112)
Creatinine, Ser: 0.74 mg/dL (ref 0.40–1.20)
GFR: 109.09 mL/min (ref >60.00)
Glucose, Bld: 82 mg/dL (ref 70–99)
Potassium: 3.6 mEq/L (ref 3.5–5.1)
Sodium: 139 mEq/L (ref 135–145)

## 2021-03-01 LAB — LIPID PANEL
Cholesterol: 218 mg/dL — ABNORMAL HIGH (ref 0–200)
HDL: 34.9 mg/dL — ABNORMAL LOW (ref >39.00)
Total CHOL/HDL Ratio: 6
Triglycerides: 461 mg/dL — ABNORMAL HIGH (ref 0.0–149.0)

## 2021-03-01 LAB — TSH: TSH: 1.59 u[IU]/mL (ref 0.35–5.50)

## 2021-03-01 LAB — LDL CHOLESTEROL, DIRECT: Direct LDL: 124 mg/dL

## 2021-03-01 NOTE — Patient Instructions (Signed)
Hyperglycemia Hyperglycemia occurs when the level of sugar (glucose) in the blood is too high. Glucose is a type of sugar that provides the body's main source of energy. Certain hormones (insulin and glucagon) control the level of glucose in the blood. Insulin lowers blood glucose, and glucagon increases blood glucose. Hyperglycemia can result from not having enough insulin in the bloodstream, or from the body not responding normally to insulin. Hyperglycemia occurs most often in people who have diabetes (diabetes mellitus), but it can happen in people who do not have diabetes. It can develop quickly, and it can be life-threatening if it causes you to become severely dehydrated (diabetic ketoacidosis or hyperglycemic hyperosmolar state). Severe hyperglycemia is a medical emergency. For most people with diabetes, a blood glucose level above 240 mg/dL is considered hyperglycemia. What are the causes? If you have diabetes, hyperglycemia may be caused by: Medicines that increase blood glucose or affect your diabetes control. Getting less physical activity. Eating more than planned. Being sick or injured, having an infection, or having surgery. Stress. Not giving yourself enough insulin (if you are taking insulin). If you have undiagnosed diabetes, this may be the reason you have hyperglycemia. If you do not have diabetes, hyperglycemia may be caused by: Certain medicines, including: Steroid medicines. Beta-blockers. Epinephrine. Thiazide diuretics. Stress. Having a serious illness, an infection, or surgery. Diseases of the pancreas. What increases the risk? Hyperglycemia is more likely to develop in people who have risk factors for diabetes, such as: Having a family member with diabetes. Certain conditions in which the body's disease-fighting system (immune system) attacks itself (autoimmune disorders). Being overweight or obese. Having an inactive (sedentary) lifestyle. Having been diagnosed  with insulin resistance. Having a history of prediabetes, gestational diabetes, or polycystic ovarian syndrome (PCOS). What are the signs or symptoms? Hyperglycemia may not cause any symptoms. If you do have symptoms, they may include: Increased thirst. Needing to urinate more often than usual. Hunger. Feeling very tired. Blurry vision. Other symptoms may develop if hyperglycemia gets worse, such as: Dry mouth. Abdominal pain. Loss of appetite. Fruity-smelling breath. Weakness. Unexpected weight loss. Tingling or numbness in the hands or feet. Headache. Cuts or bruises that are slow to heal. How is this diagnosed? Hyperglycemia is diagnosed with a blood test to measure your blood glucose level. This blood test is usually done while you are having symptoms. Your health care provider may also do a physical exam and review your medical history. You may have more tests to determine the cause of your hyperglycemia, such as: A fasting blood glucose (FBG) test. You will not be allowed to eat (you will fast) for at least 8 hours before a blood sample is taken. An A1C blood test. This provides information about blood glucose control over the previous 2-3 months. An oral glucose tolerance test (OGTT). This measures your blood glucose at two times: After fasting. This is your baseline blood glucose level. 2 hours after drinking a beverage that contains glucose. How is this treated? Treatment depends on the cause of your hyperglycemia. Treatment may include: Taking medicine to regulate your blood glucose levels. If you take insulin or other diabetes medicines, your medicine or dosage may be adjusted. Lifestyle changes, such as exercising more, eating healthier foods, or losing weight. Treating an illness or infection. Checking your blood glucose more often. Stopping or reducing steroid medicines. If your hyperglycemia becomes severe and it results in diabetic ketoacidosis or hyperglycemic  hyperosmolar state, you must be hospitalized and given IV fluids   and IV insulin. Follow these instructions at home: General instructions Take over-the-counter and prescription medicines only as told by your health care provider. Do not use any products that contain nicotine or tobacco. These products include cigarettes, chewing tobacco, and vaping devices, such as e-cigarettes. If you need help quitting, ask your health care provider. If you drink alcohol: Limit how much you have to: 0-1 drink a day for women who are not pregnant. 0-2 drinks a day for men. Know how much alcohol is in a drink. In the [place] one drink equals one 12 oz bottle of beer (355 mL), one 5 oz glass of wine (148 mL), or one 1 oz glass of hard liquor (44 mL). Learn to manage stress. If you need help with this, ask your health care provider. Do exercises as told by your health care provider. Keep all follow-up visits. This is important. Eating and drinking  Maintain a healthy weight. Stay hydrated, especially when you exercise, get sick, or spend time in hot temperatures. Drink enough fluid to keep your urine pale yellow. If you have diabetes:  Know the symptoms of hyperglycemia. Follow your diabetes management plan as told by your health care provider. Make sure you: Take your insulin and medicines as told. Follow your exercise plan. Follow your meal plan. Eat on time, and do not skip meals. Check your blood glucose as often as told. Make sure to check your blood glucose before and after exercise. If you exercise longer or in a different way, check your blood glucose more often. Follow your sick day plan whenever you cannot eat or drink normally. Make this plan in advance with your health care provider. Share your diabetes management plan with people in your workplace, school, and household. Check your urine for ketones when you are ill and as told by your health care provider. Carry a medical alert card or wear  medical alert jewelry. Where to find more information American Diabetes Association: www.diabetes.org Contact a health care provider if: Your blood glucose is at or above 240 mg/dL (13.3 mmol/L) for 2 days in a row. You have problems keeping your blood glucose in your target range. You have frequent episodes of hyperglycemia. You have signs of illness, such as nausea, vomiting, or fever. Get help right away if: Your blood glucose monitor reads "high" even when you are taking insulin. You have trouble breathing. You have a change in how you think, feel, or act (mental status). You have nausea or vomiting that does not go away. These symptoms may represent a serious problem that is an emergency. Do not wait to see if the symptoms will go away. Get medical help right away. Call your local emergency services (911 in the [place] Do not drive yourself to the hospital. Summary Hyperglycemia occurs when the level of sugar (glucose) in the blood is too high. Hyperglycemia can happen with or without diabetes, and severe hyperglycemia can be life-threatening. Hyperglycemia is diagnosed with a blood test to measure your blood glucose level. This blood test is usually done while you are having symptoms. Your health care provider may also do a physical exam and review your medical history. If you have diabetes, follow your diabetes management plan as told by your health care provider. Contact your health care provider if you have problems keeping your blood glucose in your target range. This information is not intended to replace advice given to you by your health care provider. Make sure you discuss any questions you have   with your health care provider. Document Revised: 11/21/2019 Document Reviewed: 11/21/2019 Elsevier Patient Education  2022 Else[employer] Inc.  

## 2021-03-01 NOTE — Progress Notes (Signed)
Subjective:  Patient ID: Catherine Villarreal, female    DOB: 1990-09-03  Age: 31 y.o. MRN: [id number]  CC: Hyperlipidemia  This visit occurred during the SARS-CoV-2 public health emergency.  Safety protocols were in place, including screening questions prior to the visit, additional usage of staff PPE, and extensive cleaning of exam room while observing appropriate contact time as indicated for disinfecting solutions.    HPI MAGENTA KRAUTER presents for establishing.  She has an extensively positive review of systems.  She is treated for depression anxiety with Effexor.  She is managed by a psychiatrist and a psychotherapist.  She was previously seeing another PCP who had prescribed a thyroid supplement.  She stopped taking that about 6 weeks ago. She was not willing to get a flu vaccine.  History Anelys has a past medical history of Anxiety and depression, Chronic headaches, Gallstones, Kidney stones, and Migraine.   She has a past surgical history that includes Cholecystectomy (2018).   Her family history includes Breast cancer in her mother; Cancer in her mother; Drug abuse in her brother; Luiz Blare' disease in her mother; Other in her mother; Stroke in her father; Thyroid cancer in her mother; Thyroid disease in her maternal aunt, maternal grandmother, and mother.She reports that she has been smoking cigarettes. She has been smoking an average of .75 packs per day. She has never used smokeless tobacco. She reports current alcohol use. She reports that she does not use drugs.  Outpatient Medications Prior to Visit  Medication Sig Dispense Refill   acetaminophen (TYLENOL) 500 MG tablet Take 1,000 mg by mouth every 6 (six) hours as needed.     Cholecalciferol (VITAMIN D-3) 125 MCG (5000 UT) TABS Take 2 tablets by mouth daily at 12 noon.     Multiple Vitamins-Minerals (WOMENS MULTI GUMMIES PO) Take by mouth. Take 2 daily     paragard intrauterine copper IUD IUD 1 each by Intrauterine route once.      Ibuprofen 200 MG CAPS Take 800 mg by mouth as needed.     NP THYROID 120 MG tablet Take 1 tablet (120 mg total) by mouth daily before breakfast. 30 tablet 0   progesterone (PROMETRIUM) 200 MG capsule Take 1 capsule (200 mg total) by mouth at bedtime. 30 capsule 0   No facility-administered medications prior to visit.    ROS Review of Systems  Constitutional:  Positive for fatigue (chronic) and unexpected weight change (wt gain).  Eyes: Negative.   Respiratory:  Negative for cough, chest tightness and wheezing.   Cardiovascular:  Negative for chest pain, palpitations and leg swelling.  Gastrointestinal:  Positive for constipation. Negative for abdominal pain, blood in stool, diarrhea, nausea and vomiting.       ++ bloating  Genitourinary:  Positive for menstrual problem. Negative for difficulty urinating.       Her periods are long, irregular, heavy.  Musculoskeletal: Negative.  Negative for arthralgias.  Skin: Negative.   Neurological:  Positive for light-headedness and headaches. Negative for dizziness and weakness.  Hematological:  Negative for adenopathy. Does not bruise/bleed easily.  Psychiatric/Behavioral:  Positive for dysphoric mood. Negative for decreased concentration, self-injury, sleep disturbance and suicidal ideas.    Objective:  BP 122/76 (BP Location: Right Arm, Patient Position: Sitting, Cuff Size: Large)    Pulse 90    Temp 97.9 F (36.6 C) (Oral)    Resp 16    Ht 5\' 7"  (1.702 m)    Wt 192 lb (87.1 kg)  LMP 02/27/2021 (Exact Date)    SpO2 96%    BMI 30.07 kg/m   Physical Exam Vitals reviewed.  Constitutional:      Appearance: Normal appearance.  HENT:     Nose: Nose normal.     Mouth/Throat:     Mouth: Mucous membranes are moist.  Eyes:     General: No scleral icterus.    Conjunctiva/sclera: Conjunctivae normal.  Cardiovascular:     Rate and Rhythm: Normal rate and regular rhythm.     Heart sounds: No murmur heard. Pulmonary:     Effort: Pulmonary effort  is normal.     Breath sounds: No stridor. No wheezing, rhonchi or rales.  Abdominal:     General: Abdomen is flat.     Palpations: There is no mass.     Tenderness: There is no abdominal tenderness. There is no guarding.     Hernia: No hernia is present.  Musculoskeletal:        General: Normal range of motion.     Cervical back: Neck supple.     Right lower leg: No edema.     Left lower leg: No edema.  Lymphadenopathy:     Cervical: No cervical adenopathy.  Skin:    General: Skin is warm and dry.     Coloration: Skin is not pale.  Neurological:     General: No focal deficit present.     Mental Status: She is alert and oriented to person, place, and time. Mental status is at baseline.  Psychiatric:        Mood and Affect: Mood normal.        Behavior: Behavior normal.        Thought Content: Thought content normal.        Judgment: Judgment normal.    Lab Results  Component Value Date   WBC 10.4 01/25/2021   HGB 13.8 01/25/2021   HCT 40.7 01/25/2021   PLT 267 01/25/2021   GLUCOSE 82 03/01/2021   CHOL 218 (H) 03/01/2021   TRIG (H) 03/01/2021    461.0 Triglyceride is over 400; calculations on Lipids are invalid.   HDL 34.90 (L) 03/01/2021   LDLDIRECT 124.0 03/01/2021   LDLCALC 113 (H) 05/25/2020   ALT 24 03/01/2021   AST 24 03/01/2021   NA 139 03/01/2021   K 3.6 03/01/2021   CL 104 03/01/2021   CREATININE 0.74 03/01/2021   BUN 8 03/01/2021   CO2 26 03/01/2021   TSH 1.59 03/01/2021   HGBA1C 5.8 03/01/2021     Assessment & Plan:   Janayah was seen today for hyperlipidemia.  Diagnoses and all orders for this visit:  Menorrhagia with regular cycle- Labs are negative for secondary causes. -     TSH; Future -     Ambulatory referral to Gynecology -     TSH  Pure hypertriglyceridemia- I encouraged her to improve her lifestyle modifications. -     Lipid panel; Future -     Lipid panel  Chronic fatigue- I do not see any secondary causes for this.  I do not think  she would benefit from thyroid replacement therapy. -     Hepatic function panel; Future -     TSH; Future -     TSH -     Hepatic function panel  Chronic hyperglycemia- Her A1c is normal at 5.8%. -     Basic metabolic panel; Future -     Hepatic function panel; Future -  Hemoglobin A1c; Future -     C-peptide; Future -     C-peptide -     Hemoglobin A1c -     Hepatic function panel -     Basic metabolic panel  Other orders -     LDL cholesterol, direct   I have discontinued [employer]ters. Royal's Ibuprofen, NP Thyroid, and progesterone. I am also having her maintain her acetaminophen, paragard intrauterine copper, Vitamin D-3, and Multiple Vitamins-Minerals (WOMENS MULTI GUMMIES PO).  No orders of the defined types were placed in this encounter.    Follow-up: Return in about 3 months (around 05/30/2021).  Sanda Linger, MD

## 2021-03-02 LAB — C-PEPTIDE: C-Peptide: 6.66 ng/mL — ABNORMAL HIGH (ref 0.80–3.85)

## 2021-03-03 ENCOUNTER — Encounter: Payer: Self-pay | Admitting: Internal Medicine

## 2021-04-20 ENCOUNTER — Ambulatory Visit: Payer: Medicaid Other | Admitting: Internal Medicine
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
## 2021-04-26 ENCOUNTER — Telehealth (INDEPENDENT_AMBULATORY_CARE_PROVIDER_SITE_OTHER): Payer: Medicaid Other | Admitting: Psychiatry

## 2021-04-26 ENCOUNTER — Telehealth: Payer: Self-pay | Admitting: Psychiatry

## 2021-04-26 ENCOUNTER — Encounter: Payer: Self-pay | Admitting: Psychiatry

## 2021-04-26 ENCOUNTER — Other Ambulatory Visit: Payer: Self-pay

## 2021-04-26 DIAGNOSIS — F330 Major depressive disorder, recurrent, mild: Secondary | ICD-10-CM

## 2021-04-26 MED ORDER — VENLAFAXINE HCL ER 150 MG PO CP24: 150.0000 mg | ORAL_CAPSULE | Freq: Every day | ORAL | 0 refills | Status: DC

## 2021-04-26 NOTE — Telephone Encounter (Signed)
Sent link for video visit through Epic. Patient did not sign in. Called the patient for appointment scheduled today. The patient did not answer the phone. Left voice message to contact the office ([phone]).   ?

## 2021-05-30 ENCOUNTER — Ambulatory Visit (INDEPENDENT_AMBULATORY_CARE_PROVIDER_SITE_OTHER): Payer: Medicaid Other | Admitting: Internal Medicine

## 2021-05-30 ENCOUNTER — Encounter: Payer: Self-pay | Admitting: Internal Medicine

## 2021-05-30 VITALS — BP 124/70 | HR 90 | Temp 98.0°F | Resp 16 | Ht 67.0 in | Wt 190.0 lb

## 2021-05-30 DIAGNOSIS — F330 Major depressive disorder, recurrent, mild: Secondary | ICD-10-CM | POA: Diagnosis not present
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
## 2021-05-30 NOTE — Patient Instructions (Signed)
[facility] ?[phone] [address] ?[address] ?[phone]  ? ? ?Major Depressive Disorder, Adult ?Major depressive disorder (MDD) is a mental health condition. It may also be called clinical depression or unipolar depression. MDD causes symptoms of sadness, hopelessness, and loss of interest in things. These symptoms last most of the day, almost every day, for 2 weeks. MDD can also cause physical symptoms. It can interfere with relationships and with everyday activities, such as work, school, and activities that are usually pleasant. ?MDD may be mild, moderate, or severe. It may be single-episode MDD, which happens once, or recurrent MDD, which may occur multiple times. ?What are the causes? ?The exact cause of this condition is not known. MDD is most likely caused by a combination of things, which may include: ?Your personality traits. ?Learned or conditioned behaviors or thoughts or feelings that reinforce negativity. ?Any alcohol or substance misuse. ?Long-term (chronic) physical or mental health illness. ?Going through a traumatic experience or major life changes. ?What increases the risk? ?The following factors may make someone more likely to develop MDD: ?A family history of depression. ?Being a woman. ?Troubled family relationships. ?Abnormally low levels of certain brain chemicals. ?Traumatic or painful events in childhood, especially abuse or loss of a parent. ?A lot of stress from life experiences, such as poor living conditions or discrimination. ?Chronic physical illness or other mental health disorders. ?What are the signs or symptoms? ?The main symptoms of MDD usually include: ?Constant depressed or irritable mood. ?A loss of interest in things and activities. ?Other symptoms include: ?Sleeping or eating too much or too little. ?Unexplained weight gain or weight loss. ?Tiredness or low energy. ?Being agitated, restless, or weak. ?Feeling hopeless, worthless, or guilty. ?Trouble  thinking clearly or making decisions. ?Thoughts of suicide or thoughts of harming others. ?Isolating oneself or avoiding other people or activities. ?Trouble completing tasks, work, or any normal obligations. ?Severe symptoms of this condition may include: ?Psychotic depression.This may include false beliefs, or delusions. It may also include seeing, hearing, tasting, smelling, or feeling things that are not real (hallucinations). ?Chronic depression or persistent depressive disorder. This is low-level depression that lasts for at least 2 years. ?Melancholic depression, or feeling extremely sad and hopeless. ?Catatonic depression, which includes trouble speaking and trouble moving. ?How is this diagnosed? ?This condition may be diagnosed based on: ?Your symptoms. ?Your medical and mental health history. You may be asked questions about your lifestyle, including any drug and alcohol use. ?A physical exam. ?Blood tests to rule out other conditions. ?MDD is confirmed if you have the following symptoms most of the day, nearly every day, in a 2-week period: ?Either a depressed mood or loss of interest. ?At least four other MDD symptoms. ?How is this treated? ?This condition is usually treated by mental health professionals, such as psychologists, psychiatrists, and clinical social workers. You may need more than one type of treatment. Treatment may include: ?Psychotherapy, also called talk therapy or counseling. Types of psychotherapy include: ?Cognitive behavioral therapy (CBT). This teaches you to recognize unhealthy feelings, thoughts, and behaviors, and replace them with positive thoughts and actions. ?Interpersonal therapy (IPT). This helps you to improve the way you communicate with others or relate to them. ?Family therapy. This treatment includes members of your family. ?Medicines to treat anxiety and depression. These medicines help to balance the brain chemicals that affect your emotions. ?Lifestyle changes.  You may be asked to: ?Limit alcohol use and avoid drug use. ?Get regular exercise. ?Get plenty  of sleep. ?Make healthy eating choices. ?Spend more time outdoors. ?Brain stimulation. This may be done if symptoms are very severe and other treatments have not worked. Examples of this treatment are electroconvulsive therapy and transcranial magnetic stimulation. ?Follow these instructions at home: ?Activity ?Exercise regularly and spend time outdoors. ?Find activities that you enjoy doing, and make time to do them. ?Find healthy ways to manage stress, such as: ?Meditation or deep breathing. ?Spending time in nature. ?Journaling. ?Return to your normal activities as told by your health care provider. Ask your health care provider what activities are safe for you. ?Alcohol and drug use ?If you drink alcohol: ?Limit how much you use to: ?0-1 drink a day for women who are not pregnant. ?0-2 drinks a day for men. ?Be aware of how much alcohol is in your drink. In the [place] one drink equals one 12 oz bottle of beer (355 mL), one 5 oz glass of wine (148 mL), or one 1? oz glass of hard liquor (44 mL). ?Discuss your alcohol use with your health care provider. Alcohol can affect any antidepressant medicines you are taking. ?Discuss any drug use with your health care provider. ?General instructions ? ?Take over-the-counter and prescription medicines only as told by your health care provider. ?Eat a healthy diet and get plenty of sleep. ?Consider joining a support group. Your health care provider may be able to recommend one. ?Keep all follow-up visits as told by your health care provider. This is important. ?Where to find more information ?National Alliance on Mental Illness: www.nami.org ?[place] [employer] of [employer]: http://www.maynard.net/ ?Contact a health care provider if: ?Your symptoms get worse. ?You develop new symptoms. ?Get help right away if: ?You self-harm. ?You have serious thoughts about hurting yourself or  others. ?You hallucinate. ?If you ever feel like you may hurt yourself or others, or have thoughts about taking your own life, get help right away. Go to your nearest emergency department or: ?Call your local emergency services (911 in the [place] ?Call a suicide crisis helpline, such as the National Suicide Prevention Lifeline at 814-271-9378 or 988 in the [place] This is open 24 hours a day in the [place] ?Text the Crisis Text Line at 505-334-7351 (in the [place] ?Summary ?Major depressive disorder (MDD) is a mental health condition. MDD causes symptoms of sadness, hopelessness, and loss of interest in things. These symptoms last most of the day, almost every day, for 2 weeks. ?The symptoms of MDD can interfere with relationships and with everyday activities. ?Treatments and support are available for people who develop MDD. You may need more than one type of treatment. ?Get help right away if you have serious thoughts about hurting yourself or others. ?This information is not intended to replace advice given to you by your health care provider. Make sure you discuss any questions you have with your health care provider. ?Document Revised: 09/01/2020 Document Reviewed: 01/18/2019 ?Elsevier Patient Education ? 2022 Elsevier Inc. ? ?

## 2021-06-01 ENCOUNTER — Encounter: Payer: Self-pay | Admitting: Psychiatry

## 2021-06-01 ENCOUNTER — Telehealth (INDEPENDENT_AMBULATORY_CARE_PROVIDER_SITE_OTHER): Payer: Medicaid Other | Admitting: Psychiatry

## 2021-06-01 DIAGNOSIS — F330 Major depressive disorder, recurrent, mild: Secondary | ICD-10-CM | POA: Diagnosis not present

## 2021-06-01 DIAGNOSIS — R632 Polyphagia: Secondary | ICD-10-CM

## 2021-06-01 MED ORDER — TOPIRAMATE 25 MG PO CPSP: 25.0000 mg | ORAL_CAPSULE | Freq: Two times a day (BID) | ORAL | 1 refills | Status: DC

## 2021-06-01 MED ORDER — VENLAFAXINE HCL ER 150 MG PO CP24: 150.0000 mg | ORAL_CAPSULE | Freq: Every day | ORAL | 1 refills | Status: DC

## 2021-06-01 NOTE — Patient Instructions (Addendum)
Continue venlafaxine 150 mg daily ?Start topiramate 25 mg twice a day  ?2. . Next appointment- 5/24 at 10 AM, video ?

## 2021-06-02 ENCOUNTER — Ambulatory Visit (INDEPENDENT_AMBULATORY_CARE_PROVIDER_SITE_OTHER): Payer: Medicaid Other | Admitting: Student

## 2021-06-02 ENCOUNTER — Encounter: Payer: Self-pay | Admitting: Student

## 2021-06-02 ENCOUNTER — Other Ambulatory Visit (HOSPITAL_COMMUNITY)
Admission: RE | Admit: 2021-06-02 | Discharge: 2021-06-02 | Disposition: A | Discharge status: Home / Self Care | Payer: Medicaid Other | Source: Ambulatory Visit | Attending: Student | Admitting: Student

## 2021-06-02 ENCOUNTER — Telehealth: Payer: Self-pay

## 2021-06-02 VITALS — BP 111/77 | HR 85 | Wt 187.0 lb

## 2021-06-02 DIAGNOSIS — Z30432 Encounter for removal of intrauterine contraceptive device: Secondary | ICD-10-CM

## 2021-06-02 DIAGNOSIS — N920 Excessive and frequent menstruation with regular cycle: Secondary | ICD-10-CM | POA: Diagnosis not present

## 2021-06-02 MED ORDER — ETONOGESTREL-ETHINYL ESTRADIOL 0.12-0.015 MG/24HR VA RING: VAGINAL_RING | VAGINAL | 12 refills | Status: DC

## 2021-06-02 NOTE — Telephone Encounter (Signed)
pharmacy fax a request for a 90 day supply of the topiramate   ?

## 2021-06-02 NOTE — Telephone Encounter (Signed)
Pharmacy notified.

## 2021-06-02 NOTE — Telephone Encounter (Signed)
Decline at this time as we may change the dose, and she requests for monthly refills

## 2021-06-02 NOTE — Progress Notes (Signed)
?History:  ?Ms. Catherine Villarreal is a 31 y.o. G2P1011 who presents to clinic today for pain and cramping with her IUD. She reports that she has tried medicines for her IUD and it does not help. She also needs a pap smear. She does not want anything with hormones; she reports that the nexplanon made her "unstable". She does not think she can take hormones      ?Long discussion about patient's family history of breast cancer; she and her partner would like for her to have a mammogram.  ? ?The following portions of the patient's history were reviewed and updated as appropriate: allergies, current medications, family history, past medical history, social history, past surgical history and problem list. ? ?Review of Systems:  ?Review of Systems  ?Constitutional: Negative.   ?HENT: Negative.    ?Respiratory: Negative.    ?Cardiovascular: Negative.   ?Gastrointestinal: Negative.   ?Genitourinary: Negative.   ?Musculoskeletal: Negative.   ?Skin: Negative.   ?Neurological: Negative.   ?Psychiatric/Behavioral: Negative.    ? ?  ?Objective:  ?Physical Exam ?BP 111/77   Pulse 85   Wt 187 lb (84.8 kg)   LMP 05/23/2021 (Exact Date)   BMI 29.29 kg/m?  ?Physical Exam ?Constitutional:   ?   Appearance: Normal appearance.  ?Cardiovascular:  ?   Rate and Rhythm: Normal rate.  ?Pulmonary:  ?   Effort: Pulmonary effort is normal.  ?Abdominal:  ?   General: Abdomen is flat.  ?Genitourinary: ?   General: Normal vulva.  ?   Vagina: No vaginal discharge.  ?   Comments: IUD strings visualized, no discharge or blood in the vagina ?Neurological:  ?   Mental Status: She is alert.  ? ? ? ? ?Labs and Imaging ?No results found for this or any previous visit (from the past 24 hour(s)). ? ?No results found. ? ?There are no preventive care reminders to display for this patient. ? ? ?Assessment & Plan:  ?1. Menorrhagia with regular cycle ?-after long discussion with patient and her partner, patient would like to try Nuvaring. She initially did not  want "any" hormones, but then agreed to try hormones method if she could stop it on her own (as opposed to a procedure in office). She is an appropriate candidate for COCs. She states she cannot take a pill at the same time every day.  ?-patient is interested in switching to [facility] as that is closer to where she lives; she can call and make appt ?-see IUD removal note below  ?- Cytology - PAP( [place]) ?- Cervicovaginal ancillary only( [place]) ?-Patient requesting mammogram today due to family history of breast cancer (breast cancer on mom's side); patient will gather more information from her mom on age of diagnosis and will write me back; she also come back for BRCA testing as well.  ? ? ?Approximately 30 minutes of total time was spent with this patient on exam, IUD removal,  ? ?Starr Lake, CNM ?06/02/2021 ?10:55 AM ? ? ? ? ? ?GYNECOLOGY OFFICE PROCEDURE NOTE ? ?Catherine SOL is a 31 y.o. G2P1011 here for paraguard IUD removal. No GYN concerns.  Last pap smear was done today.  ?IUD Removal  ?Patient identified, informed consent performed, consent signed.  Patient was in the dorsal lithotomy position, normal external genitalia was noted.  A speculum was placed in the patient's vagina, normal discharge was noted, no lesions. The cervix was visualized, no lesions, no abnormal discharge.  The strings of  the IUD were grasped and pulled using ring forceps. The IUD was removed in its entirety.   Patient tolerated the procedure well.   ? ?Patient will use nuvaring for contraception.  Routine preventative health maintenance measures emphasized. ? ?Maye Villarreal  ?[facility] ? ?

## 2021-06-02 NOTE — Progress Notes (Signed)
Patient wants IUD removed today and is undecided on different form of birth control. She informed me that since getting copper IUD she's been having periods 2 weeks long along with "painful cramps". ? ?Chiles informed me that every time she has sex with S/O she develops a vaginal odor. She would like to be screened for bacterial vaginosis and declined STD screening. I informed her that we will do a separate screening for BV along with pap smear  ? ? ? ? ?

## 2021-06-03 LAB — CERVICOVAGINAL ANCILLARY ONLY
Bacterial Vaginitis (gardnerella): POSITIVE — AB
Candida Glabrata: NEGATIVE
Candida Vaginitis: NEGATIVE
Comment: NEGATIVE
Comment: NEGATIVE
Comment: NEGATIVE

## 2021-06-03 NOTE — Progress Notes (Signed)
? ?Subjective:  ?Patient ID: Catherine Villarreal, female    DOB: [birth year]  Age: 31 y.o. MRN: [id number] ? ?CC: Depression ? ? ?HPI ?Catherine Villarreal presents for f/up - ? ?She tells me that her depression has been well controlled but she would like to be referred to someone to see if she has ADHD. ? ?Outpatient Medications Prior to Visit  ?Medication Sig Dispense Refill  ? acetaminophen (TYLENOL) 500 MG tablet Take 1,000 mg by mouth every 6 (six) hours as needed.    ? Cholecalciferol (VITAMIN D-3) 125 MCG (5000 UT) TABS Take 2 tablets by mouth daily at 12 noon.    ? Multiple Vitamins-Minerals (WOMENS MULTI GUMMIES PO) Take by mouth. Take 2 daily    ? paragard intrauterine copper IUD IUD 1 each by Intrauterine route once.    ? venlafaxine XR (EFFEXOR-XR) 150 MG 24 hr capsule Take 1 capsule (150 mg total) by mouth daily with breakfast. 30 capsule 0  ? ?No facility-administered medications prior to visit.  ? ? ?ROS ?Review of Systems  ?Constitutional:  Negative for diaphoresis and fatigue.  ?HENT: Negative.    ?Eyes: Negative.   ?Respiratory: Negative.    ?Cardiovascular: Negative.  Negative for chest pain.  ?Gastrointestinal:  Negative for abdominal pain.  ?Endocrine: Negative.   ?Genitourinary: Negative.   ?Musculoskeletal: Negative.   ?Skin: Negative.   ?Neurological: Negative.   ?Hematological:  Negative for adenopathy. Does not bruise/bleed easily.  ?Psychiatric/Behavioral:  Positive for decreased concentration. Negative for dysphoric mood and sleep disturbance. The patient is nervous/anxious.   ? ?Objective:  ?BP 124/70 (BP Location: Right Arm, Patient Position: Sitting, Cuff Size: Normal)   Pulse 90   Temp 98 ?F (36.7 ?C) (Oral)   Resp 16   Ht 5\' 7"  (1.702 m)   Wt 190 lb (86.2 kg)   LMP 05/23/2021 (Exact Date)   SpO2 98%   BMI 29.76 kg/m?  ? ?BP Readings from Last 3 Encounters:  ?06/02/21 111/77  ?05/30/21 124/70  ?03/01/21 122/76  ? ? ?Wt Readings from Last 3 Encounters:  ?06/02/21 187 lb (84.8 kg)  ?05/30/21  190 lb (86.2 kg)  ?03/01/21 192 lb (87.1 kg)  ? ? ?Physical Exam ?Vitals reviewed.  ?Eyes:  ?   General: No scleral icterus. ?   Conjunctiva/sclera: Conjunctivae normal.  ?Cardiovascular:  ?   Rate and Rhythm: Normal rate and regular rhythm.  ?   Heart sounds: No murmur heard. ?Pulmonary:  ?   Effort: Pulmonary effort is normal.  ?   Breath sounds: No stridor. No wheezing, rhonchi or rales.  ?Abdominal:  ?   General: Abdomen is flat.  ?   Palpations: There is no mass.  ?   Tenderness: There is no abdominal tenderness. There is no guarding.  ?   Hernia: No hernia is present.  ?Musculoskeletal:     ?   General: Normal range of motion.  ?   Cervical back: Neck supple.  ?   Right lower leg: No edema.  ?   Left lower leg: No edema.  ?Lymphadenopathy:  ?   Cervical: No cervical adenopathy.  ?Skin: ?   General: Skin is warm and dry.  ?Neurological:  ?   General: No focal deficit present.  ?   Mental Status: She is alert.  ?Psychiatric:     ?   Mood and Affect: Mood normal.     ?   Behavior: Behavior normal.     ?   Thought Content: Thought content  normal.     ?   Judgment: Judgment normal.  ? ? ?Lab Results  ?Component Value Date  ? WBC 10.4 01/25/2021  ? HGB 13.8 01/25/2021  ? HCT 40.7 01/25/2021  ? PLT 267 01/25/2021  ? GLUCOSE 82 03/01/2021  ? CHOL 218 (H) 03/01/2021  ? TRIG (H) 03/01/2021  ?  461.0 Triglyceride is over 400; calculations on Lipids are invalid.  ? HDL 34.90 (L) 03/01/2021  ? LDLDIRECT 124.0 03/01/2021  ? LDLCALC 113 (H) 05/25/2020  ? ALT 24 03/01/2021  ? AST 24 03/01/2021  ? NA 139 03/01/2021  ? K 3.6 03/01/2021  ? CL 104 03/01/2021  ? CREATININE 0.74 03/01/2021  ? BUN 8 03/01/2021  ? CO2 26 03/01/2021  ? TSH 1.59 03/01/2021  ? HGBA1C 5.8 03/01/2021  ? ? ?DG Chest 2 View ? ?Result Date: 01/25/2021 ?CLINICAL DATA:  Chest pain EXAM: CHEST - 2 VIEW COMPARISON:  01/02/2017 FINDINGS: Cardiac size is within normal limits. Low position of diaphragms suggests normal variation due to patient's body habitus or  suggest air trapping. There is interval clearing of infiltrate in the medial right lower lung fields. There are no signs of alveolar pulmonary edema or focal pulmonary consolidation. There is no significant pleural effusion or pneumothorax. IMPRESSION: No active cardiopulmonary disease. Electronically Signed   By: Ernie Avena M.D.   On: 01/25/2021 10:51  ? ?CT Angio Chest PE W/Cm &/Or Wo Cm ? ?Result Date: 01/25/2021 ?CLINICAL DATA:  Chest pain, cough EXAM: CT ANGIOGRAPHY CHEST WITH CONTRAST TECHNIQUE: Multidetector CT imaging of the chest was performed using the standard protocol during bolus administration of intravenous contrast. Multiplanar CT image reconstructions and MIPs were obtained to evaluate the vascular anatomy. CONTRAST:  OMNIPAQUE IOHEXOL 350 MG/ML SOLN COMPARISON:  Chest radiograph done earlier today FINDINGS: Cardiovascular: Unremarkable. There is homogeneous enhancement in thoracic aorta. There are no intraluminal filling defects in the pulmonary artery branches. Mediastinum/Nodes: No significant lymphadenopathy seen. Lungs/Pleura: There is no focal pulmonary consolidation. Blebs and bullae are seen in the lower lung fields. There is no pleural effusion or pneumothorax. Upper Abdomen: There is fatty infiltration in the liver. Spleen is enlarged in size. Musculoskeletal: Unremarkable Review of the MIP images confirms the above findings. IMPRESSION: There is no evidence of pulmonary artery embolism. There is no evidence of thoracic aortic dissection. There is no focal pulmonary consolidation. Fatty liver.  Enlarged spleen. Electronically Signed   By: Ernie Avena M.D.   On: 01/25/2021 11:51  ? ? ?Assessment & Plan:  ? ?Catherine Villarreal was seen today for depression. ? ?Diagnoses and all orders for this visit: ? 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 ? ? ?I am having Catherine Villarreal maintain her acetaminophen, paragard intrauterine copper,  Vitamin D-3, and Multiple Vitamins-Minerals (WOMENS MULTI GUMMIES PO). ? ?No orders of the defined types were placed in this encounter. ? ? ? ?Follow-up: No follow-ups on file. ? ?Sanda Linger, MD ?

## 2021-06-06 ENCOUNTER — Other Ambulatory Visit: Payer: Self-pay | Admitting: Student

## 2021-06-06 ENCOUNTER — Encounter: Payer: Self-pay | Admitting: Student

## 2021-06-06 MED ORDER — METRONIDAZOLE 500 MG PO TABS: 500.0000 mg | ORAL_TABLET | Freq: Two times a day (BID) | ORAL | 0 refills | Status: DC

## 2021-06-07 ENCOUNTER — Encounter: Payer: Self-pay | Admitting: Student

## 2021-06-07 ENCOUNTER — Telehealth: Payer: Self-pay | Admitting: Family Medicine

## 2021-06-07 ENCOUNTER — Other Ambulatory Visit: Payer: Self-pay | Admitting: Student

## 2021-06-07 DIAGNOSIS — Z803 Family history of malignant neoplasm of breast: Secondary | ICD-10-CM

## 2021-06-07 NOTE — Telephone Encounter (Signed)
Called patient to schedule lab visit, there was no answer tot he call so a voicemail was left with the call back number for the office. ?

## 2021-06-08 LAB — CYTOLOGY - PAP
Comment: NEGATIVE
Diagnosis: NEGATIVE
High risk HPV: NEGATIVE

## 2021-06-12 ENCOUNTER — Encounter: Payer: Self-pay | Admitting: Student

## 2021-06-16 ENCOUNTER — Ambulatory Visit (INDEPENDENT_AMBULATORY_CARE_PROVIDER_SITE_OTHER): Payer: Medicaid Other

## 2021-06-16 DIAGNOSIS — Z803 Family history of malignant neoplasm of breast: Secondary | ICD-10-CM | POA: Diagnosis not present

## 2021-06-16 DIAGNOSIS — N200 Calculus of kidney: Secondary | ICD-10-CM | POA: Diagnosis not present

## 2021-06-16 NOTE — Progress Notes (Signed)
Pt here today for Emp[employer] kit blood draw. Pt has family hx of stage 3 lobular breast cancer from mother dx 2014. ?Pt tolerated blood draw well. Results will take 2 weeks and pt informed. Pt verbalized understanding.  ?Gage Treiber,RN  ?

## 2021-06-17 ENCOUNTER — Encounter: Payer: Self-pay | Admitting: Student
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
## 2021-07-13 ENCOUNTER — Encounter: Payer: Self-pay | Admitting: Psychiatry

## 2021-07-13 ENCOUNTER — Telehealth (INDEPENDENT_AMBULATORY_CARE_PROVIDER_SITE_OTHER): Payer: Medicaid Other | Admitting: Psychiatry

## 2021-07-13 DIAGNOSIS — F330 Major depressive disorder, recurrent, mild: Secondary | ICD-10-CM

## 2021-07-13 DIAGNOSIS — R632 Polyphagia: Secondary | ICD-10-CM | POA: Diagnosis not present

## 2021-07-13 MED ORDER — VENLAFAXINE HCL ER 150 MG PO CP24: 150.0000 mg | ORAL_CAPSULE | Freq: Every day | ORAL | 1 refills | Status: DC

## 2021-07-13 NOTE — Patient Instructions (Signed)
Continue venlafaxine 150 mg daily Discontinue topiramate 2. . Next appointment- 7/12 at 11 AM

## 2021-08-09 ENCOUNTER — Encounter: Payer: Self-pay | Admitting: Family Medicine
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
## 2021-08-31 ENCOUNTER — Telehealth: Payer: Medicaid Other | Admitting: Psychiatry

## 2021-08-31 ENCOUNTER — Telehealth: Payer: Self-pay | Admitting: Psychiatry

## 2021-08-31 NOTE — Telephone Encounter (Signed)
Sent link for video visit through Epic. Patient did not sign in. Called the patient for appointment scheduled today. The patient did not answer the phone. Left voice message to contact the office ([phone]).   ?

## 2021-11-13 ENCOUNTER — Ambulatory Visit
Admission: RE | Admit: 2021-11-13 | Discharge: 2021-11-13 | Disposition: A | Discharge status: Home / Self Care | Payer: Medicaid Other | Source: Ambulatory Visit | Attending: Emergency Medicine | Admitting: Emergency Medicine

## 2021-11-13 VITALS — BP 121/84 | HR 81 | Temp 98.6°F | Resp 18 | Ht 67.0 in

## 2021-11-13 DIAGNOSIS — J069 Acute upper respiratory infection, unspecified: Secondary | ICD-10-CM

## 2021-11-13 DIAGNOSIS — J029 Acute pharyngitis, unspecified: Secondary | ICD-10-CM

## 2021-11-13 LAB — POCT RAPID STREP A (OFFICE): Rapid Strep A Screen: NEGATIVE

## 2021-11-13 MED ORDER — LIDOCAINE VISCOUS HCL 2 % MT SOLN: 15.0000 mL | OROMUCOSAL | 0 refills | Status: DC | PRN

## 2021-11-13 NOTE — ED Triage Notes (Signed)
Patient to Urgent Care with complaints of sore throat. Reports the left side of her throat and her posterior tongue are swollen and painful. Pain worst when swallowing and talking. Denies any known fevers.  Symptoms started yesterday, does report that she has had frequent illnesses over the last month.

## 2021-11-13 NOTE — Discharge Instructions (Addendum)
Your strep test is negative.    Use the viscous lidocaine as directed.  Take Tylenol or ibuprofen as needed for fever or discomfort.     Follow-up with your primary care provider if your symptoms are not improving.

## 2021-11-13 NOTE — ED Provider Notes (Signed)
[facility]    CSN: [id number] Arrival date & time: 11/13/21  1124      History   Chief Complaint Chief Complaint  Patient presents with   Sore Throat    Left side of throat and back left side of tongue feels swollen and is on pain progression has been rapid hurts to talk and swallow as well as eat or move tongue - Entered by patient    HPI Catherine Villarreal is a 31 y.o. female.  Patient presents with sore throat and sore tongue x 1 day.  She also reports cough x1 month.  No difficultly swallowing, fever, chills, rash, shortness of breath, vomiting, diarrhea, or other symptoms.  Treatment at home with Tylenol and ibuprofen.  The history is provided by the patient and medical records.    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 Authorizing Provider  lidocaine (XYLOCAINE) 2 % solution Use as directed 15 mLs in the mouth or throat as needed for mouth pain. 11/13/21  Yes Mickie Bail, NP  acetaminophen (TYLENOL) 500 MG tablet Take 1,000 mg by mouth every 6 (six) hours as needed.    [provider]  Cholecalciferol (VITAMIN D-3) 125 MCG (5000 UT) TABS Take 2  tablets by mouth daily at 12 noon.    [provider]  etonogestrel-ethinyl estradiol (NUVARING) 0.12-0.015 MG/24HR vaginal ring Insert vaginally and leave in place for 3 consecutive weeks, then remove for 1 week. 06/02/21   Marylene Land, CNM  metroNIDAZOLE (FLAGYL) 500 MG tablet Take 1 tablet (500 mg total) by mouth 2 (two) times daily. 06/06/21   Marylene Land, CNM  Multiple Vitamins-Minerals (WOMENS MULTI GUMMIES PO) Take by mouth. Take 2 daily    [provider]  topiramate (TOPAMAX) 25 MG capsule Take 1 capsule (25 mg total) by mouth 2 (two) times daily. Patient not taking: Reported on 06/02/2021 06/01/21 07/31/21  [name], MD  venlafaxine XR (EFFEXOR-XR) 150 MG 24 hr capsule Take 1 capsule (150 mg total) by mouth daily with breakfast. 08/03/21 10/02/21  [name], MD    Family History Family History  Problem Relation Age of Onset   Cancer Mother    Breast cancer Mother    Thyroid cancer Mother    Other Mother    Thyroid disease Mother    Luiz Blare' disease Mother    Drug abuse  Brother    Stroke Father    Thyroid disease Maternal Aunt    Thyroid disease Maternal Grandmother     Social History Social History   Tobacco Use   Smoking status: Every Day    Packs/day: 0.75    Types: Cigarettes   Smokeless tobacco: Never  Vaping Use   Vaping Use: Never used  Substance Use Topics   Alcohol use: Yes    Comment: quit Oct 2021   Drug use: Never     Allergies   Dilaudid [hydromorphone hcl] and Adhesive [tape]   Review of Systems Review of Systems  Constitutional:  Negative for chills and fever.  HENT:  Positive for sore throat. Negative for ear pain and trouble swallowing.   Respiratory:  Positive for cough. Negative for shortness of breath.   Cardiovascular:  Negative for chest pain and palpitations.  Gastrointestinal:  Negative for diarrhea and vomiting.  Skin:  Negative for color change and rash.  All other systems reviewed  and are negative.    Physical Exam Triage Vital Signs ED Triage Vitals  Enc Vitals Group     BP      Pulse      Resp      Temp      Temp src      SpO2      Weight      Height      Head Circumference      Peak Flow      Pain Score      Pain Loc      Pain Edu?      Excl. in GC?    No data found.  Updated Vital Signs BP 121/84   Pulse 81   Temp 98.6 F (37 C)   Resp 18   Ht 5\' 7"  (1.702 m)   LMP 10/21/2021   SpO2 97%   BMI 29.29 kg/m   Visual Acuity Right Eye Distance:   Left Eye Distance:   Bilateral Distance:    Right Eye Near:   Left Eye Near:    Bilateral Near:     Physical Exam Vitals and nursing note reviewed.  Constitutional:      General: She is not in acute distress.    Appearance: Normal appearance. She is well-developed. She is not ill-appearing.  HENT:     Right Ear: Tympanic membrane normal.     Left Ear: Tympanic membrane normal.     Nose: Nose normal.     Mouth/Throat:     Mouth: Mucous membranes are moist.     Pharynx: Posterior oropharyngeal erythema present.  Cardiovascular:     Rate and Rhythm: Normal rate and regular rhythm.     Heart sounds: Normal heart sounds.  Pulmonary:     Effort: Pulmonary effort is normal. No respiratory distress.     Breath sounds: Normal breath sounds.  Musculoskeletal:     Cervical back: Neck supple.  Skin:    General: Skin is warm and dry.  Neurological:     Mental Status: She is alert.  Psychiatric:        Mood and Affect: Mood normal.        Behavior: Behavior normal.      UC Treatments / Results  Labs (all labs ordered are listed, but only abnormal results are displayed) Labs Reviewed  POCT RAPID STREP A (OFFICE)    EKG   Radiology No results found.  Procedures Procedures (including critical care time)  Medications Ordered in  UC Medications - No data to display  Initial Impression / Assessment and Plan / UC Course  I have reviewed the triage vital signs and the nursing  notes.  Pertinent labs & imaging results that were available during my care of the patient were reviewed by me and considered in my medical decision making (see chart for details).    Sore throat, viral URI.  Rapid strep negative.  Patient declines COVID test.  Treating with viscous lidocaine.  Discussed symptomatic treatment including Tylenol or ibuprofen.  Instructed patient to follow up with her PCP if her symptoms are not improving.  She agrees to plan of care.    Final Clinical Impressions(s) / UC Diagnoses   Final diagnoses:  Sore throat  Viral URI     Discharge Instructions      Your strep test is negative.    Use the viscous lidocaine as directed.  Take Tylenol or ibuprofen as needed for fever or discomfort.     Follow-up with your primary care provider if your symptoms are not improving.         ED Prescriptions     Medication Sig Dispense Auth. Provider   lidocaine (XYLOCAINE) 2 % solution Use as directed 15 mLs in the mouth or throat as needed for mouth pain. 100 mL Sharion Balloon, NP      PDMP not reviewed this encounter.   Sharion Balloon, NP 11/13/21 [phone]

## 2021-11-17 ENCOUNTER — Telehealth: Payer: Self-pay | Admitting: Psychiatry

## 2021-11-17 ENCOUNTER — Other Ambulatory Visit: Payer: Self-pay | Admitting: Psychiatry

## 2021-11-17 MED ORDER — VENLAFAXINE HCL ER 150 MG PO CP24: 150.0000 mg | ORAL_CAPSULE | Freq: Every day | ORAL | 1 refills | Status: DC

## 2021-11-17 NOTE — Telephone Encounter (Signed)
Pt notified that rx sent to pharmacy

## 2021-11-17 NOTE — Telephone Encounter (Signed)
Ordered

## 2021-11-17 NOTE — Telephone Encounter (Signed)
Patient missed appointment in July due to family emergencies. Called to get scheduled and states she is out of medication. Please send refill on venlafaxine to pharmacy
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
## 2021-12-21 ENCOUNTER — Encounter: Payer: Self-pay | Admitting: Psychiatry

## 2021-12-21 ENCOUNTER — Telehealth (INDEPENDENT_AMBULATORY_CARE_PROVIDER_SITE_OTHER): Payer: Medicaid Other | Admitting: Psychiatry

## 2021-12-21 DIAGNOSIS — F3341 Major depressive disorder, recurrent, in partial remission: Secondary | ICD-10-CM

## 2021-12-21 DIAGNOSIS — F17200 Nicotine dependence, unspecified, uncomplicated: Secondary | ICD-10-CM

## 2021-12-21 DIAGNOSIS — R5383 Other fatigue: Secondary | ICD-10-CM

## 2021-12-21 DIAGNOSIS — R41840 Attention and concentration deficit: Secondary | ICD-10-CM

## 2021-12-21 DIAGNOSIS — F17210 Nicotine dependence, cigarettes, uncomplicated: Secondary | ICD-10-CM | POA: Diagnosis not present

## 2021-12-21 MED ORDER — VARENICLINE TARTRATE 1 MG PO TABS: 1.0000 mg | ORAL_TABLET | Freq: Two times a day (BID) | ORAL | 0 refills | Status: AC

## 2021-12-21 MED ORDER — VENLAFAXINE HCL ER 150 MG PO CP24: 150.0000 mg | ORAL_CAPSULE | Freq: Every day | ORAL | 1 refills | Status: DC

## 2021-12-21 MED ORDER — VARENICLINE TARTRATE 0.5 MG PO TABS: ORAL_TABLET | ORAL | 0 refills | Status: AC

## 2021-12-21 NOTE — Patient Instructions (Signed)
Continue venlafaxine 150 mg daily Referral for evaluation of ADHD  Obtain labs- thyroid, CBC with ferritin, CMP Start Chantix: Days 1 to 3: 0.5 mg once daily.                   Days 4 to 7: 0.5 mg twice daily.                              Day 8 and later: 1 mg twice daily 5. . Next appointment- 12/13 at 4 PM

## 2021-12-28 ENCOUNTER — Encounter: Payer: Self-pay | Admitting: Internal Medicine

## 2021-12-28 ENCOUNTER — Ambulatory Visit (INDEPENDENT_AMBULATORY_CARE_PROVIDER_SITE_OTHER): Payer: Medicaid Other

## 2021-12-28 ENCOUNTER — Ambulatory Visit (INDEPENDENT_AMBULATORY_CARE_PROVIDER_SITE_OTHER): Payer: Medicaid Other | Admitting: Internal Medicine

## 2021-12-28 VITALS — BP 118/76 | HR 77 | Temp 98.1°F | Ht 67.0 in | Wt 179.0 lb

## 2021-12-28 DIAGNOSIS — R10817 Generalized abdominal tenderness: Secondary | ICD-10-CM

## 2021-12-28 DIAGNOSIS — K9089 Other intestinal malabsorption: Secondary | ICD-10-CM | POA: Insufficient documentation

## 2021-12-28 DIAGNOSIS — K2100 Gastro-esophageal reflux disease with esophagitis, without bleeding: Secondary | ICD-10-CM | POA: Diagnosis not present

## 2021-12-28 DIAGNOSIS — K591 Functional diarrhea: Secondary | ICD-10-CM | POA: Insufficient documentation

## 2021-12-28 DIAGNOSIS — R10819 Abdominal tenderness, unspecified site: Secondary | ICD-10-CM | POA: Diagnosis not present

## 2021-12-28 LAB — URINALYSIS, ROUTINE W REFLEX MICROSCOPIC
Bilirubin Urine: NEGATIVE
Ketones, ur: NEGATIVE
Leukocytes,Ua: NEGATIVE
Nitrite: NEGATIVE
Specific Gravity, Urine: 1.025 (ref 1.000–1.030)
Total Protein, Urine: NEGATIVE
Urine Glucose: NEGATIVE
Urobilinogen, UA: 0.2 (ref 0.0–1.0)
pH: 5.5 (ref 5.0–8.0)

## 2021-12-28 LAB — HEPATIC FUNCTION PANEL
ALT: 28 U/L (ref 0–35)
AST: 30 U/L (ref 0–37)
Albumin: 4.5 g/dL (ref 3.5–5.2)
Alkaline Phosphatase: 70 U/L (ref 39–117)
Bilirubin, Direct: 0.1 mg/dL (ref 0.0–0.3)
Total Bilirubin: 0.5 mg/dL (ref 0.2–1.2)
Total Protein: 7.3 g/dL (ref 6.0–8.3)

## 2021-12-28 LAB — CBC WITH DIFFERENTIAL/PLATELET
Basophils Absolute: 0 10*3/uL (ref 0.0–0.1)
Basophils Relative: 0.3 % (ref 0.0–3.0)
Eosinophils Absolute: 0.1 10*3/uL (ref 0.0–0.7)
Eosinophils Relative: 0.6 % (ref 0.0–5.0)
HCT: 41.7 % (ref 36.0–46.0)
Hemoglobin: 14.4 g/dL (ref 12.0–15.0)
Lymphocytes Relative: 29.1 % (ref 12.0–46.0)
Lymphs Abs: 2.8 10*3/uL (ref 0.7–4.0)
MCHC: 34.6 g/dL (ref 30.0–36.0)
MCV: 88.1 fl (ref 78.0–100.0)
Monocytes Absolute: 0.7 10*3/uL (ref 0.1–1.0)
Monocytes Relative: 7.6 % (ref 3.0–12.0)
Neutro Abs: 6 10*3/uL (ref 1.4–7.7)
Neutrophils Relative %: 62.4 % (ref 43.0–77.0)
Platelets: 296 10*3/uL (ref 150.0–400.0)
RBC: 4.73 Mil/uL (ref 3.87–5.11)
RDW: 12.7 % (ref 11.5–15.5)
WBC: 9.7 10*3/uL (ref 4.0–10.5)

## 2021-12-28 LAB — LIPASE: Lipase: 11 U/L (ref 11.0–59.0)

## 2021-12-28 LAB — HCG, QUANTITATIVE, PREGNANCY: Quantitative HCG: <0.6 m[IU]/mL

## 2021-12-28 LAB — AMYLASE: Amylase: 16 U/L — ABNORMAL LOW (ref 27–131)

## 2021-12-28 LAB — TRIGLYCERIDES: Triglycerides: 228 mg/dL — ABNORMAL HIGH (ref 0.0–149.0)

## 2021-12-28 LAB — C-REACTIVE PROTEIN: CRP: <1 mg/dL (ref 0.5–20.0)

## 2021-12-28 MED ORDER — CHOLESTYRAMINE LIGHT 4 G PO PACK: 4.0000 g | PACK | Freq: Three times a day (TID) | ORAL | 0 refills | Status: DC

## 2021-12-28 MED ORDER — ESOMEPRAZOLE MAGNESIUM 40 MG PO CPDR: 40.0000 mg | DELAYED_RELEASE_CAPSULE | Freq: Every day | ORAL | 0 refills | Status: DC

## 2021-12-28 MED ORDER — HYOSCYAMINE SULFATE ER 0.375 MG PO TB12: 0.3750 mg | ORAL_TABLET | Freq: Two times a day (BID) | ORAL | 0 refills | Status: DC | PRN

## 2021-12-28 NOTE — Progress Notes (Unsigned)
Subjective:  Patient ID: Catherine Villarreal, female    DOB: 05/24/1990  Age: 31 y.o. MRN: [id number]  CC: Abdominal Pain   HPI Catherine Villarreal presents for f/up -  She complains that she has had diarrhea for 6 years after she had her gallbladder removed.  Over the last month the diarrhea has worsened.  She has also had abdominal pain that she describes as a constant dull ache with an occasional stabbing sensation.  She has not noticed any abdominal cramping and says her appetite has been normal.  To control the symptoms she has been taking Motrin, Tylenol, Alka-Seltzer, and Pepto-Bismol.  She also complains of heartburn but has not been taking anything for heartburn and has not taken an antidiarrheal medication.   Outpatient Medications Prior to Visit  Medication Sig Dispense Refill   acetaminophen (TYLENOL) 500 MG tablet Take 1,000 mg by mouth every 6 (six) hours as needed.     etonogestrel-ethinyl estradiol (NUVARING) 0.12-0.015 MG/24HR vaginal ring Insert vaginally and leave in place for 3 consecutive weeks, then remove for 1 week. 1 each 12   varenicline (CHANTIX) 1 MG tablet Take 1 tablet (1 mg total) by mouth 2 (two) times daily. After completing 0.5 mg twice a day for 4 days 60 tablet 0   [START ON 01/17/2022] venlafaxine XR (EFFEXOR-XR) 150 MG 24 hr capsule Take 1 capsule (150 mg total) by mouth daily with breakfast. 30 capsule 1   Cholecalciferol (VITAMIN D-3) 125 MCG (5000 UT) TABS Take 2 tablets by mouth daily at 12 noon.     lidocaine (XYLOCAINE) 2 % solution Use as directed 15 mLs in the mouth or throat as needed for mouth pain. 100 mL 0   metroNIDAZOLE (FLAGYL) 500 MG tablet Take 1 tablet (500 mg total) by mouth 2 (two) times daily. 14 tablet 0   Multiple Vitamins-Minerals (WOMENS MULTI GUMMIES PO) Take by mouth. Take 2 daily     topiramate (TOPAMAX) 25 MG capsule Take 1 capsule (25 mg total) by mouth 2 (two) times daily. (Patient not taking: Reported on 06/02/2021) 60 capsule 1   No  facility-administered medications prior to visit.    ROS Review of Systems  Constitutional:  Positive for unexpected weight change (wt loss). Negative for chills, diaphoresis, fatigue and fever.  HENT: Negative.  Negative for trouble swallowing and voice change.   Eyes: Negative.   Respiratory:  Negative for cough, chest tightness, shortness of breath and wheezing.   Cardiovascular:  Negative for chest pain, palpitations and leg swelling.  Gastrointestinal:  Positive for abdominal pain and diarrhea. Negative for anal bleeding, blood in stool, constipation, nausea and vomiting.  Endocrine: Negative.   Genitourinary: Negative.  Negative for difficulty urinating, dysuria and hematuria.  Musculoskeletal: Negative.  Negative for arthralgias.  Skin: Negative.  Negative for color change and rash.  Allergic/Immunologic: Negative.   Neurological:  Positive for dizziness and light-headedness.  Hematological:  Negative for adenopathy. Does not bruise/bleed easily.  Psychiatric/Behavioral: Negative.  The patient is not nervous/anxious.     Objective:  BP 118/76 (BP Location: Left Arm, Patient Position: Sitting, Cuff Size: Large)   Pulse 77   Temp 98.1 F (36.7 C) (Oral)   Ht 5\' 7"  (1.702 m)   Wt 179 lb (81.2 kg)   LMP 12/12/2021 (Exact Date)   SpO2 99%   BMI 28.04 kg/m   BP Readings from Last 3 Encounters:  12/28/21 118/76  11/13/21 121/84  06/02/21 111/77    Wt Readings from Last  3 Encounters:  12/28/21 179 lb (81.2 kg)  06/02/21 187 lb (84.8 kg)  05/30/21 190 lb (86.2 kg)    Physical Exam Vitals reviewed.  Constitutional:      Appearance: She is not ill-appearing.  HENT:     Mouth/Throat:     Mouth: Mucous membranes are moist.  Eyes:     General: No scleral icterus.    Conjunctiva/sclera: Conjunctivae normal.  Cardiovascular:     Rate and Rhythm: Normal rate and regular rhythm.     Heart sounds: No murmur heard. Pulmonary:     Effort: Pulmonary effort is normal.      Breath sounds: No stridor. No wheezing, rhonchi or rales.  Abdominal:     General: Abdomen is flat. Bowel sounds are increased. There is no distension.     Palpations: Abdomen is soft. There is no hepatomegaly, splenomegaly or mass.     Tenderness: There is abdominal tenderness in the right lower quadrant, periumbilical area, suprapubic area and left lower quadrant. There is no guarding or rebound.     Hernia: No hernia is present.  Musculoskeletal:        General: Normal range of motion.     Right lower leg: No edema.     Left lower leg: No edema.  Lymphadenopathy:     Cervical: No cervical adenopathy.  Skin:    General: Skin is warm and dry.     Coloration: Skin is not pale.  Neurological:     General: No focal deficit present.     Mental Status: She is alert. Mental status is at baseline.  Psychiatric:        Mood and Affect: Mood normal.     Lab Results  Component Value Date   WBC 9.7 12/28/2021   HGB 14.4 12/28/2021   HCT 41.7 12/28/2021   PLT 296.0 12/28/2021   GLUCOSE 82 03/01/2021   CHOL 218 (H) 03/01/2021   TRIG 228.0 (H) 12/28/2021   HDL 34.90 (L) 03/01/2021   LDLDIRECT 124.0 03/01/2021   LDLCALC 113 (H) 05/25/2020   ALT 28 12/28/2021   AST 30 12/28/2021   NA 139 03/01/2021   K 3.6 03/01/2021   CL 104 03/01/2021   CREATININE 0.74 03/01/2021   BUN 8 03/01/2021   CO2 26 03/01/2021   TSH 1.59 03/01/2021   HGBA1C 5.8 03/01/2021    No results found.   Assessment & Plan:   Catherine Villarreal was seen today for abdominal pain.  Diagnoses and all orders for this visit:  Functional diarrhea- Testing for celiac disease is negative. -     Tissue Transglutaminase Abs,IgG,IgA -     Discontinue: hyoscyamine (LEVBID) 0.375 MG 12 hr tablet; Take 1 tablet (0.375 mg total) by mouth every 12 (twelve) hours as needed.  Gastroesophageal reflux disease with esophagitis without hemorrhage- Will treat with a PPI. -     CBC with Differential/Platelet; Future -     CBC with  Differential/Platelet -     esomeprazole (NEXIUM) 40 MG capsule; Take 1 capsule (40 mg total) by mouth daily. -     Discontinue: hyoscyamine (LEVBID) 0.375 MG 12 hr tablet; Take 1 tablet (0.375 mg total) by mouth every 12 (twelve) hours as needed.  Generalized abdominal tenderness without rebound tenderness- Labs are reassuring. -     Lipase; Future -     Amylase; Future -     C-reactive protein; Future -     Urinalysis, Routine w reflex microscopic; Future -  Hepatic function panel; Future -     hCG, quantitative, pregnancy; Future -     DG ABD ACUTE 2+V W 1V CHEST; Future -     Triglycerides; Future -     Triglycerides -     hCG, quantitative, pregnancy -     Hepatic function panel -     Urinalysis, Routine w reflex microscopic -     C-reactive protein -     Amylase -     Lipase -     Discontinue: hyoscyamine (LEVBID) 0.375 MG 12 hr tablet; Take 1 tablet (0.375 mg total) by mouth every 12 (twelve) hours as needed.  Bile salt-induced diarrhea -     cholestyramine light (PREVALITE) 4 g packet; Take 1 packet (4 g total) by mouth 3 (three) times daily.   I have discontinued Tor Netters. Waldschmidt's Vitamin D-3, Multiple Vitamins-Minerals (WOMENS MULTI GUMMIES PO), topiramate, metroNIDAZOLE, lidocaine, and hyoscyamine. I am also having her start on esomeprazole and cholestyramine light. Additionally, I am having her maintain her acetaminophen, etonogestrel-ethinyl estradiol, venlafaxine XR, and varenicline.  Meds ordered this encounter  Medications   esomeprazole (NEXIUM) 40 MG capsule    Sig: Take 1 capsule (40 mg total) by mouth daily.    Dispense:  90 capsule    Refill:  0   DISCONTD: hyoscyamine (LEVBID) 0.375 MG 12 hr tablet    Sig: Take 1 tablet (0.375 mg total) by mouth every 12 (twelve) hours as needed.    Dispense:  180 tablet    Refill:  0   cholestyramine light (PREVALITE) 4 g packet    Sig: Take 1 packet (4 g total) by mouth 3 (three) times daily.    Dispense:  270 packet     Refill:  0     Follow-up: No follow-ups on file.  Sanda Linger, MD

## 2021-12-29 LAB — TISSUE TRANSGLUTAMINASE ABS,IGG,IGA
(tTG) Ab, IgA: <1 U/mL
(tTG) Ab, IgG: <1 U/mL

## 2022-01-07 DIAGNOSIS — J019 Acute sinusitis, unspecified: Secondary | ICD-10-CM | POA: Diagnosis not present

## 2022-01-26 NOTE — Telephone Encounter (Signed)
Received a voice message (for some reason, I received this mail just now) to contact our office. When she calls back, please schedule for sooner available slot, hopefully tomorrow. Thanks.

## 2022-01-30 ENCOUNTER — Other Ambulatory Visit: Payer: Self-pay | Admitting: Internal Medicine

## 2022-01-30 ENCOUNTER — Encounter: Payer: Self-pay | Admitting: Internal Medicine

## 2022-01-30 ENCOUNTER — Encounter: Payer: Self-pay | Admitting: Psychology

## 2022-01-30 DIAGNOSIS — K2100 Gastro-esophageal reflux disease with esophagitis, without bleeding: Secondary | ICD-10-CM

## 2022-01-30 DIAGNOSIS — K591 Functional diarrhea: Secondary | ICD-10-CM

## 2022-01-30 DIAGNOSIS — K9089 Other intestinal malabsorption: Secondary | ICD-10-CM
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
## 2022-02-01 ENCOUNTER — Telehealth (INDEPENDENT_AMBULATORY_CARE_PROVIDER_SITE_OTHER): Payer: Medicaid Other | Admitting: Psychiatry

## 2022-02-01 ENCOUNTER — Encounter: Payer: Self-pay | Admitting: Psychiatry

## 2022-02-01 DIAGNOSIS — R5383 Other fatigue: Secondary | ICD-10-CM | POA: Diagnosis not present

## 2022-02-01 DIAGNOSIS — F331 Major depressive disorder, recurrent, moderate: Secondary | ICD-10-CM

## 2022-02-01 DIAGNOSIS — R41840 Attention and concentration deficit: Secondary | ICD-10-CM | POA: Diagnosis not present

## 2022-02-01 MED ORDER — VENLAFAXINE HCL ER 75 MG PO CP24: 75.0000 mg | ORAL_CAPSULE | Freq: Every day | ORAL | 1 refills | Status: DC

## 2022-02-01 NOTE — Patient Instructions (Signed)
Increase venlafaxine 225 mg daily  Consider light therapy  Next appointment- 1/24 at 10 AM  Generally, the light box should:  Emit full-spectrum light with either fluorescent or LED bulbs (fluorescent is usually recommended) Provide an exposure to 10,000 lux of light Produce as little UV light as possible  Typical recommendations include using the light box: Within the 30 mins to first hour of waking up in the morning For about 20 to 30 minutes About 16 to 24 inches (41 to 61 centimeters) from your face, but follow the manufacturer's instructions about distance With eyes open, but not looking directly at the light  Noted that Light boxes aren't regulated by the Food and Drug Administration (FDA) for SAD (seasonal affective disorder) treatment.

## 2022-02-06 DIAGNOSIS — R5383 Other fatigue: Secondary | ICD-10-CM | POA: Diagnosis not present

## 2022-02-06 DIAGNOSIS — G2581 Restless legs syndrome: Secondary | ICD-10-CM | POA: Diagnosis not present

## 2022-02-06 DIAGNOSIS — E611 Iron deficiency: Secondary | ICD-10-CM | POA: Diagnosis not present

## 2022-02-07 ENCOUNTER — Telehealth: Payer: Self-pay | Admitting: Psychiatry

## 2022-02-07 ENCOUNTER — Encounter: Payer: Self-pay | Admitting: Psychiatry

## 2022-02-07 LAB — COMPREHENSIVE METABOLIC PANEL
ALT: 88 IU/L — ABNORMAL HIGH (ref 0–32)
AST: 80 IU/L — ABNORMAL HIGH (ref 0–40)
Albumin/Globulin Ratio: 2.2 (ref 1.2–2.2)
Albumin: 4.6 g/dL (ref 4.0–5.0)
Alkaline Phosphatase: 105 IU/L (ref 44–121)
BUN/Creatinine Ratio: 10 (ref 9–23)
BUN: 9 mg/dL (ref 6–20)
Bilirubin Total: 0.4 mg/dL (ref 0.0–1.2)
CO2: 23 mmol/L (ref 20–29)
Calcium: 9.9 mg/dL (ref 8.7–10.2)
Chloride: 99 mmol/L (ref 96–106)
Creatinine, Ser: 0.88 mg/dL (ref 0.57–1.00)
Globulin, Total: 2.1 g/dL (ref 1.5–4.5)
Glucose: 105 mg/dL — ABNORMAL HIGH (ref 70–99)
Potassium: 4.3 mmol/L (ref 3.5–5.2)
Sodium: 139 mmol/L (ref 134–144)
Total Protein: 6.7 g/dL (ref 6.0–8.5)
eGFR: 91 mL/min/{1.73_m2} (ref >59)

## 2022-02-07 LAB — FERRITIN: Ferritin: 229 ng/mL — ABNORMAL HIGH (ref 15–150)

## 2022-02-07 LAB — CBC
Hematocrit: 45.3 % (ref 34.0–46.6)
Hemoglobin: 15.4 g/dL (ref 11.1–15.9)
MCH: 30.1 pg (ref 26.6–33.0)
MCHC: 34 g/dL (ref 31.5–35.7)
MCV: 89 fL (ref 79–97)
Platelets: 297 10*3/uL (ref 150–450)
RBC: 5.12 x10E6/uL (ref 3.77–5.28)
RDW: 11.7 % (ref 11.7–15.4)
WBC: 8.2 10*3/uL (ref 3.4–10.8)

## 2022-02-07 LAB — TSH: TSH: 2 u[IU]/mL (ref 0.450–4.500)

## 2022-02-07 NOTE — Telephone Encounter (Signed)
Left a voice message to inform regarding the lab result. The result/recommendation will be communicated in My chart message, and we will mail this letter as well. She is advised to contact the clinic if any questions.

## 2022-02-08 ENCOUNTER — Telehealth: Payer: Self-pay | Admitting: Internal Medicine

## 2022-02-08 NOTE — Telephone Encounter (Signed)
PT calls today in regards to recent labs drawn during Psychiatry appointment with Dr.Hisada. PT was advised by Dr.Hisada to reach out to Dr.Jones to check on abnormal readings from these labs. PT stated she didn't want to create a follow up yet with Dr.Jones unless he felt it necessary after review.  CB: [phone]

## 2022-02-08 NOTE — Telephone Encounter (Signed)
Was able to reach out to PT and got them scheduled to next available.

## 2022-02-08 NOTE — Telephone Encounter (Signed)
noted 

## 2022-02-14 ENCOUNTER — Other Ambulatory Visit: Payer: Self-pay | Admitting: Internal Medicine

## 2022-02-14 ENCOUNTER — Ambulatory Visit (INDEPENDENT_AMBULATORY_CARE_PROVIDER_SITE_OTHER): Payer: Medicaid Other | Admitting: Internal Medicine

## 2022-02-14 ENCOUNTER — Encounter: Payer: Self-pay | Admitting: Internal Medicine

## 2022-02-14 VITALS — BP 118/74 | HR 77 | Temp 98.0°F | Resp 16 | Ht 67.0 in | Wt 182.0 lb

## 2022-02-14 DIAGNOSIS — G4440 Drug-induced headache, not elsewhere classified, not intractable: Secondary | ICD-10-CM

## 2022-02-14 DIAGNOSIS — K2100 Gastro-esophageal reflux disease with esophagitis, without bleeding: Secondary | ICD-10-CM | POA: Diagnosis not present

## 2022-02-14 MED ORDER — TOPIRAMATE 25 MG PO TABS: 25.0000 mg | ORAL_TABLET | Freq: Two times a day (BID) | ORAL | 0 refills | Status: DC

## 2022-02-14 NOTE — Patient Instructions (Signed)
Analgesic Rebound Headache An analgesic rebound headache, sometimes called a medication overuse headache or a drug-induced headache, is a secondary disorder that is caused by the overuse of pain medicine (analgesic) to treat the original (primary) headache. Any type of primary headache can return as a rebound headache if a person regularly takes analgesics. The types of primary headaches that are commonly associated with rebound headaches include: Migraines. Headaches that are caused by tense muscles in the head and neck area (tension headaches). Headaches that develop and happen again on one side of the head and around the eye (cluster headaches). If rebound headaches continue, they can become long-term, daily headaches. What are the causes? This condition may be caused by frequent use of: Over-the-counter medicines such as aspirin, ibuprofen, and acetaminophen. Sinus-relief medicines and medicines that contain caffeine. Narcotic pain medicines such as codeine and oxycodone. Some prescription migraine medicines. What are the signs or symptoms? The symptoms of a rebound headache are the same as the symptoms of the original headache. Some of the symptoms of specific types of headaches include: Migraine headache Pulsing or throbbing pain on one or both sides of the head. Severe pain that interferes with daily activities. Pain that gets worse with physical activity. Nausea, vomiting, or both. Pain and sensitivity with exposure to bright light, loud noises, or strong smells. Visual changes. Numbness of one or both arms. Tension headache Pressure around the head. Dull, aching head pain. Pain felt over the front and sides of the head. Tenderness in the muscles of the head, neck, and shoulders. Cluster headache Severe pain that begins in or around one eye or temple. Droopy or swollen eyelid, or redness and tearing in the eye on the same side as the pain. One-sided head pain. Nausea. Runny  nose. Sweaty, pale facial skin. Restlessness. How is this diagnosed? This condition is diagnosed by: Reviewing your medical history. This includes the nature of your primary headaches. Reviewing the types of pain medicines that you have been using to treat your primary headaches and how often you take them. How is this treated? This condition may be treated or managed by: Discontinuing frequent use of the analgesic medicine. Doing this may worsen your headaches at first, but the pain should eventually become more manageable, less frequent, and less severe. Seeing a headache specialist. He or she may be able to help you manage your headaches and help make sure there is not another cause of the headaches. Using methods of stress relief, such as acupuncture, counseling, biofeedback, and massage. Talk with your health care provider about which methods might be good for you. Follow these instructions at home: Medicines  Take over-the-counter and prescription medicines only as told by your health care provider. Stop the repeated use of pain medicine as told by your health care provider. Stopping can be difficult. Carefully follow instructions from your health care provider. Lifestyle Follow a regular sleep schedule. Do not vary the time that you go to bed or the amount that you sleep from day to day. It is important to stay on the same schedule to help prevent headaches. Get 7-9 hours of sleep each night, or the amount recommended by your health care provider. Exercise regularly. Exercise for at least 30 minutes, 5 times each week. Limit or manage stress. Consider stress-relief options such as acupuncture, counseling, biofeedback, and massage. Talk with your health care provider about which methods might be good for you. Do not drink alcohol. Do not use any products that contain nicotine or  tobacco, such as cigarettes, e-cigarettes, and chewing tobacco. If you need help quitting, ask your health care  provider. General instructions Avoid triggers that are known to cause your primary headaches. Keep all follow-up visits as told by your health care provider. This is important. Contact a health care provider if: You continue to experience headaches after following treatments that your health care provider recommended. Get help right away if you have: New headache pain. Headache pain that is different than what you have experienced in the past. Numbness or tingling in your arms or legs. Changes in your speech or vision. Summary An analgesic rebound headache, sometimes called a medication overuse headache or a drug-induced headache, is caused by the overuse of pain medicine (analgesic) to treat the original (primary) headache. Any type of primary headache can return as a rebound headache if a person regularly takes analgesics. The types of primary headaches that are commonly associated with rebound headaches include migraines, tension headaches, and cluster headaches. Analgesic rebound headaches can occur with frequent use of over-the-counter medicines and prescription medicines. Treatment involves stopping the medicine that is being overused. This will improve headache frequency and severity. This information is not intended to replace advice given to you by your health care provider. Make sure you discuss any questions you have with your health care provider. Document Revised: 07/21/2021 Document Reviewed: 03/06/2019 Elsevier Patient Education  2023 [employer].

## 2022-02-14 NOTE — Progress Notes (Signed)
Subjective:  Patient ID: Catherine Villarreal, female    DOB: 1991/01/04  Age: 31 y.o. MRN: [id number]  CC: Abdominal Pain and Headache   HPI Catherine Villarreal presents for f/up - She has had headaches for 7 years and frequently takes tylenol and ibuprofen. She continues to c/o abd pain and has an appt soon with GI. She refused a flu vaccine.  Outpatient Medications Prior to Visit  Medication Sig Dispense Refill   acetaminophen (TYLENOL) 500 MG tablet Take 1,000 mg by mouth every 6 (six) hours as needed.     esomeprazole (NEXIUM) 40 MG capsule Take 1 capsule (40 mg total) by mouth daily. 90 capsule 0   etonogestrel-ethinyl estradiol (NUVARING) 0.12-0.015 MG/24HR vaginal ring Insert vaginally and leave in place for 3 consecutive weeks, then remove for 1 week. 1 each 12   venlafaxine XR (EFFEXOR-XR) 150 MG 24 hr capsule Take 1 capsule (150 mg total) by mouth daily with breakfast. 30 capsule 1   venlafaxine XR (EFFEXOR-XR) 75 MG 24 hr capsule Take 1 capsule (75 mg total) by mouth daily with breakfast. Take total of 225 mg daily 30 capsule 1   cholestyramine light (PREVALITE) 4 g packet Take 1 packet (4 g total) by mouth 3 (three) times daily. 270 packet 0   No facility-administered medications prior to visit.    ROS Review of Systems  Constitutional:  Positive for fatigue. Negative for chills, diaphoresis, fever and unexpected weight change.  HENT: Negative.    Eyes: Negative.  Negative for visual disturbance.  Respiratory: Negative.  Negative for cough, chest tightness, shortness of breath and wheezing.   Cardiovascular:  Negative for chest pain, palpitations and leg swelling.  Gastrointestinal:  Positive for abdominal pain and constipation. Negative for diarrhea, nausea and vomiting.  Genitourinary: Negative.  Negative for difficulty urinating.  Musculoskeletal: Negative.  Negative for arthralgias.  Skin: Negative.   Neurological:  Positive for headaches. Negative for weakness and  light-headedness.  Psychiatric/Behavioral:  Positive for dysphoric mood. Negative for confusion, decreased concentration and suicidal ideas. The patient is nervous/anxious.     Objective:  BP 118/74 (BP Location: Left Arm, Patient Position: Sitting, Cuff Size: Large)   Pulse 77   Temp 98 F (36.7 C) (Oral)   Resp 16   Ht 5\' 7"  (1.702 m)   Wt 182 lb (82.6 kg)   LMP 01/31/2022 (Exact Date)   SpO2 99%   BMI 28.51 kg/m   BP Readings from Last 3 Encounters:  02/14/22 118/74  12/28/21 118/76  11/13/21 121/84    Wt Readings from Last 3 Encounters:  02/14/22 182 lb (82.6 kg)  12/28/21 179 lb (81.2 kg)  06/02/21 187 lb (84.8 kg)    Physical Exam Vitals reviewed.  Constitutional:      Appearance: She is not ill-appearing.  HENT:     Nose: Nose normal.     Mouth/Throat:     Mouth: Mucous membranes are moist.  Eyes:     General: No scleral icterus.    Pupils: Pupils are equal, round, and reactive to light.  Cardiovascular:     Rate and Rhythm: Normal rate and regular rhythm.     Heart sounds: No murmur heard. Pulmonary:     Effort: Pulmonary effort is normal.     Breath sounds: No stridor. No wheezing, rhonchi or rales.  Abdominal:     General: Abdomen is flat.     Palpations: There is no mass.     Tenderness: There is no abdominal  tenderness. There is no guarding.     Hernia: No hernia is present.  Musculoskeletal:        General: Normal range of motion.     Cervical back: Neck supple.     Right lower leg: No edema.     Left lower leg: No edema.  Lymphadenopathy:     Cervical: No cervical adenopathy.  Skin:    General: Skin is warm and dry.     Coloration: Skin is not pale.  Neurological:     General: No focal deficit present.     Mental Status: She is alert and oriented to person, place, and time. Mental status is at baseline.  Psychiatric:        Mood and Affect: Mood normal.        Behavior: Behavior normal.        Thought Content: Thought content normal.         Judgment: Judgment normal.     Lab Results  Component Value Date   WBC 8.2 02/06/2022   HGB 15.4 02/06/2022   HCT 45.3 02/06/2022   PLT 297 02/06/2022   GLUCOSE 105 (H) 02/06/2022   CHOL 218 (H) 03/01/2021   TRIG 228.0 (H) 12/28/2021   HDL 34.90 (L) 03/01/2021   LDLDIRECT 124.0 03/01/2021   LDLCALC 113 (H) 05/25/2020   ALT 88 (H) 02/06/2022   AST 80 (H) 02/06/2022   NA 139 02/06/2022   K 4.3 02/06/2022   CL 99 02/06/2022   CREATININE 0.88 02/06/2022   BUN 9 02/06/2022   CO2 23 02/06/2022   TSH 2.000 02/06/2022   HGBA1C 5.8 03/01/2021    No results found.  Assessment & Plan:   Issa was seen today for abdominal pain and headache.  Diagnoses and all orders for this visit:  Medication overuse headache- I recommend that she discontinue the use of the OTC meds. Will start topamax. -     topiramate (TOPAMAX) 25 MG tablet; Take 1 tablet (25 mg total) by mouth 2 (two) times daily.  Gastroesophageal reflux disease with esophagitis without hemorrhage- Her sx's are well controlled.   I have discontinued [facility]'s cholestyramine light. I am also having her start on topiramate. Additionally, I am having her maintain her acetaminophen, etonogestrel-ethinyl estradiol, venlafaxine XR, esomeprazole, and venlafaxine XR.  Meds ordered this encounter  Medications   topiramate (TOPAMAX) 25 MG tablet    Sig: Take 1 tablet (25 mg total) by mouth 2 (two) times daily.    Dispense:  60 tablet    Refill:  0     Follow-up: Return in about 3 months (around 05/16/2022).  Sanda Linger, MD

## 2022-02-16 DIAGNOSIS — R519 Headache, unspecified: Secondary | ICD-10-CM | POA: Diagnosis not present

## 2022-02-16 DIAGNOSIS — Z20822 Contact with and (suspected) exposure to covid-19: Secondary | ICD-10-CM | POA: Diagnosis not present

## 2022-02-21 ENCOUNTER — Encounter: Payer: Self-pay | Admitting: Internal Medicine

## 2022-02-23 ENCOUNTER — Emergency Department (HOSPITAL_COMMUNITY)
Admission: EM | Admit: 2022-02-23 | Discharge: 2022-02-23 | Discharge status: Against Medical Advice | Payer: Medicaid Other | Attending: Emergency Medicine | Admitting: Emergency Medicine

## 2022-02-23 ENCOUNTER — Encounter (HOSPITAL_COMMUNITY): Payer: Self-pay

## 2022-02-23 DIAGNOSIS — R42 Dizziness and giddiness: Secondary | ICD-10-CM | POA: Insufficient documentation

## 2022-02-23 DIAGNOSIS — H538 Other visual disturbances: Secondary | ICD-10-CM | POA: Diagnosis not present

## 2022-02-23 DIAGNOSIS — R200 Anesthesia of skin: Secondary | ICD-10-CM | POA: Diagnosis not present

## 2022-02-23 DIAGNOSIS — Z5321 Procedure and treatment not carried out due to patient leaving prior to being seen by health care provider: Secondary | ICD-10-CM | POA: Insufficient documentation

## 2022-02-23 NOTE — ED Notes (Signed)
Informed by registration that pt has left the ED lobby

## 2022-02-23 NOTE — ED Triage Notes (Signed)
Pt c/o dizziness, blurred vision, and difficulty concentrating that started 2 days ago. Pt states, "it feels like my whole body is shaking but I know it's not." Pt also c/o intermittent numbness in her nose, fingers, and toes. Denies pain. Pt states she was seen in at [facility] for migraines last week

## 2022-03-08 DIAGNOSIS — H5213 Myopia, bilateral: Secondary | ICD-10-CM | POA: Diagnosis not present

## 2022-03-13 ENCOUNTER — Telehealth: Payer: Self-pay | Admitting: Internal Medicine

## 2022-03-13 NOTE — Telephone Encounter (Signed)
Patient called and said that the last time she was in with Dr Ronnald Ramp they talked about her headaches and she said that he said he thought it was a rebound headache because she was taking to much medication and that he had prescribed something for it and it ended up making the headache worse. She said she had went to the hospital a few weeks ago because of the headaches, She said it was a few days after she was here last. She said that the hospital had prescribed promethazine, famotidine and a third that she can't remember the name to, she said it did help the first time for a day or 2 but not after that. She now has a constant headache everyday but said it has gotten worse for the last few days. She said she has tried different OTC meds as well as things like heat and other things but nothing has worked. Call back is [phone]

## 2022-03-13 NOTE — Telephone Encounter (Signed)
Pt scheduled for 1/23 @ 11.20am to discuss.

## 2022-03-14 ENCOUNTER — Ambulatory Visit (INDEPENDENT_AMBULATORY_CARE_PROVIDER_SITE_OTHER): Payer: Medicaid Other | Admitting: Internal Medicine

## 2022-03-14 ENCOUNTER — Other Ambulatory Visit: Payer: Self-pay | Admitting: Internal Medicine

## 2022-03-14 ENCOUNTER — Encounter: Payer: Self-pay | Admitting: Internal Medicine

## 2022-03-14 ENCOUNTER — Other Ambulatory Visit: Payer: Self-pay | Admitting: Psychiatry

## 2022-03-14 ENCOUNTER — Telehealth: Payer: Self-pay

## 2022-03-14 VITALS — BP 116/66 | HR 100 | Temp 98.2°F | Resp 16 | Ht 67.0 in | Wt 178.0 lb

## 2022-03-14 DIAGNOSIS — G44041 Chronic paroxysmal hemicrania, intractable: Secondary | ICD-10-CM

## 2022-03-14 DIAGNOSIS — R7989 Other specified abnormal findings of blood chemistry: Secondary | ICD-10-CM

## 2022-03-14 DIAGNOSIS — G4440 Drug-induced headache, not elsewhere classified, not intractable: Secondary | ICD-10-CM

## 2022-03-14 DIAGNOSIS — E781 Pure hyperglyceridemia: Secondary | ICD-10-CM

## 2022-03-14 LAB — HEPATIC FUNCTION PANEL
ALT: 27 U/L (ref 0–35)
AST: 33 U/L (ref 0–37)
Albumin: 4.5 g/dL (ref 3.5–5.2)
Alkaline Phosphatase: 77 U/L (ref 39–117)
Bilirubin, Direct: 0.1 mg/dL (ref 0.0–0.3)
Total Bilirubin: 0.4 mg/dL (ref 0.2–1.2)
Total Protein: 7.4 g/dL (ref 6.0–8.3)

## 2022-03-14 LAB — PROTIME-INR
INR: 1 ratio (ref 0.8–1.0)
Prothrombin Time: 10.6 s (ref 9.6–13.1)

## 2022-03-14 LAB — TRIGLYCERIDES: Triglycerides: 412 mg/dL — ABNORMAL HIGH (ref 0.0–149.0)

## 2022-03-14 MED ORDER — VENLAFAXINE HCL ER 150 MG PO CP24: 150.0000 mg | ORAL_CAPSULE | Freq: Every day | ORAL | 1 refills | Status: DC

## 2022-03-14 NOTE — Patient Instructions (Signed)
Migraine Headache A migraine headache is an intense, throbbing pain on one side or both sides of the head. Migraine headaches may also cause other symptoms, such as nausea, vomiting, and sensitivity to light and noise. A migraine headache can last from 4 hours to 3 days. Talk with your doctor about what things may bring on (trigger) your migraine headaches. What are the causes? The exact cause of this condition is not known. However, a migraine may be caused when nerves in the brain become irritated and release chemicals that cause inflammation of blood vessels. This inflammation causes pain. This condition may be triggered or caused by: Drinking alcohol. Smoking. Taking medicines, such as: Medicine used to treat chest pain (nitroglycerin). Birth control pills. Estrogen. Certain blood pressure medicines. Eating or drinking products that contain nitrates, glutamate, aspartame, or tyramine. Aged cheeses, chocolate, or caffeine may also be triggers. Doing physical activity. Other things that may trigger a migraine headache include: Menstruation. Pregnancy. Hunger. Stress. Lack of sleep or too much sleep. Weather changes. Fatigue. What increases the risk? The following factors may make you more likely to experience migraine headaches: Being a certain age. This condition is more common in people who are 68-5 years old. Being female. Having a family history of migraine headaches. Being Caucasian. Having a mental health condition, such as depression or anxiety. Being obese. What are the signs or symptoms? The main symptom of this condition is pulsating or throbbing pain. This pain may: Happen in any area of the head, such as on one side or both sides. Interfere with daily activities. Get worse with physical activity. Get worse with exposure to bright lights or loud noises. Other symptoms may include: Nausea. Vomiting. Dizziness. General sensitivity to bright lights, loud noises, or  smells. Before you get a migraine headache, you may get warning signs (an aura). An aura may include: Seeing flashing lights or having blind spots. Seeing bright spots, halos, or zigzag lines. Having tunnel vision or blurred vision. Having numbness or a tingling feeling. Having trouble talking. Having muscle weakness. Some people have symptoms after a migraine headache (postdromal phase), such as: Feeling tired. Difficulty concentrating. How is this diagnosed? A migraine headache can be diagnosed based on: Your symptoms. A physical exam. Tests, such as: CT scan or an MRI of the head. These imaging tests can help rule out other causes of headaches. Taking fluid from the spine (lumbar puncture) and analyzing it (cerebrospinal fluid analysis, or CSF analysis). How is this treated? This condition may be treated with medicines that: Relieve pain. Relieve nausea. Prevent migraine headaches. Treatment for this condition may also include: Acupuncture. Lifestyle changes like avoiding foods that trigger migraine headaches. Biofeedback. Cognitive behavioral therapy. Follow these instructions at home: Medicines Take over-the-counter and prescription medicines only as told by your health care provider. Ask your health care provider if the medicine prescribed to you: Requires you to avoid driving or using heavy machinery. Can cause constipation. You may need to take these actions to prevent or treat constipation: Drink enough fluid to keep your urine pale yellow. Take over-the-counter or prescription medicines. Eat foods that are high in fiber, such as beans, whole grains, and fresh fruits and vegetables. Limit foods that are high in fat and processed sugars, such as fried or sweet foods. Lifestyle Do not drink alcohol. Do not use any products that contain nicotine or tobacco, such as cigarettes, e-cigarettes, and chewing tobacco. If you need help quitting, ask your health care  provider. Get at least 8  hours of sleep every night. Find ways to manage stress, such as meditation, deep breathing, or yoga. General instructions Keep a journal to find out what may trigger your migraine headaches. For example, write down: What you eat and drink. How much sleep you get. Any change to your diet or medicines. If you have a migraine headache: Avoid things that make your symptoms worse, such as bright lights. It may help to lie down in a dark, quiet room. Do not drive or use heavy machinery. Ask your health care provider what activities are safe for you while you are experiencing symptoms. Keep all follow-up visits as told by your health care provider. This is important. Contact a health care provider if: You develop symptoms that are different or more severe than your usual migraine headache symptoms. You have more than 15 headache days in one month. Get help right away if: Your migraine headache becomes severe. Your migraine headache lasts longer than 72 hours. You have a fever. You have a stiff neck. You have vision loss. Your muscles feel weak or like you cannot control them. You start to lose your balance often. You have trouble walking. You faint. You have a seizure. Summary A migraine headache is an intense, throbbing pain on one side or both sides of the head. Migraines may also cause other symptoms, such as nausea, vomiting, and sensitivity to light and noise. This condition may be treated with medicines and lifestyle changes. You may also need to avoid certain things that trigger a migraine headache. Keep a journal to find out what may trigger your migraine headaches. Contact your health care provider if you have more than 15 headache days in a month or you develop symptoms that are different or more severe than your usual migraine headache symptoms. This information is not intended to replace advice given to you by your health care provider. Make sure you  discuss any questions you have with your health care provider. Document Revised: 07/21/2021 Document Reviewed: 03/21/2018 Elsevier Patient Education  2023 [employer]  

## 2022-03-14 NOTE — Telephone Encounter (Signed)
Received fax from pharmacy requesting a refill for the following medication  Last visit 02/01/22 Next visit 03/17/22    Disp Refills Start End    venlafaxine XR (EFFEXOR-XR) 150 MG 24 hr capsule 30 capsule 1 01/17/2022 03/18/2022   Sig - Route: Take 1 capsule (150 mg total) by mouth daily with breakfast. - Oral   Sent to pharmacy as: venlafaxine XR (EFFEXOR-XR) 150 MG 24 hr capsule

## 2022-03-14 NOTE — Progress Notes (Signed)
Subjective:  Patient ID: Catherine Villarreal, female    DOB: Feb 22, 1990  Age: 32 y.o. MRN: [id number]  CC: Hyperlipidemia   HPI [name] presents for f/up -  She has had headaches for 6 years.  She stopped taking Topamax because it made the headaches worse.  She describes the headaches as involving her whole head and occurring nearly daily.  The headaches bounce from the left side to the right side.  The headaches at times make her feel like there is a rod poking her in her left eye.  She has photo and phonophobia with the headaches.  She has had some nausea but denies vomiting.  She does not have an aura with her headaches.  She gets some symptom relief with Advil.  Outpatient Medications Prior to Visit  Medication Sig Dispense Refill   acetaminophen (TYLENOL) 500 MG tablet Take 1,000 mg by mouth every 6 (six) hours as needed.     esomeprazole (NEXIUM) 40 MG capsule Take 1 capsule (40 mg total) by mouth daily. 90 capsule 0   etonogestrel-ethinyl estradiol (NUVARING) 0.12-0.015 MG/24HR vaginal ring Insert vaginally and leave in place for 3 consecutive weeks, then remove for 1 week. 1 each 12   venlafaxine XR (EFFEXOR-XR) 75 MG 24 hr capsule Take 1 capsule (75 mg total) by mouth daily with breakfast. Take total of 225 mg daily 30 capsule 1   topiramate (TOPAMAX) 25 MG tablet Take 1 tablet (25 mg total) by mouth 2 (two) times daily. 60 tablet 0   venlafaxine XR (EFFEXOR-XR) 150 MG 24 hr capsule Take 1 capsule (150 mg total) by mouth daily with breakfast. 30 capsule 1   No facility-administered medications prior to visit.    ROS Review of Systems  Constitutional:  Positive for fatigue. Negative for appetite change, chills, diaphoresis and unexpected weight change.  HENT: Negative.    Eyes: Negative.   Respiratory:  Negative for cough, chest tightness and shortness of breath.   Cardiovascular:  Negative for chest pain, palpitations and leg swelling.  Gastrointestinal:  Negative for  abdominal pain, constipation, diarrhea, nausea and vomiting.  Endocrine: Negative.   Genitourinary: Negative.  Negative for difficulty urinating.  Musculoskeletal: Negative.  Negative for neck pain.  Skin: Negative.   Neurological:  Positive for numbness and headaches. Negative for dizziness and weakness.       Numbness in hands and feet  Hematological:  Negative for adenopathy. Does not bruise/bleed easily.  Psychiatric/Behavioral:  Positive for decreased concentration and dysphoric mood. Negative for behavioral problems, confusion, sleep disturbance and suicidal ideas. The patient is not nervous/anxious.     Objective:  BP 116/66 (BP Location: Right Arm, Patient Position: Sitting, Cuff Size: Large)   Pulse 100   Temp 98.2 F (36.8 C) (Oral)   Resp 16   Ht 5\' 7"  (1.702 m)   Wt 178 lb (80.7 kg)   LMP 02/28/2022 (Approximate)   SpO2 99%   BMI 27.88 kg/m   BP Readings from Last 3 Encounters:  03/14/22 116/66  02/23/22 (!) 127/94  02/14/22 118/74    Wt Readings from Last 3 Encounters:  03/14/22 178 lb (80.7 kg)  02/23/22 182 lb (82.6 kg)  02/14/22 182 lb (82.6 kg)    Physical Exam Vitals reviewed.  Constitutional:      Appearance: She is not ill-appearing.  HENT:     Nose: Nose normal.     Mouth/Throat:     Mouth: Mucous membranes are moist.  Eyes:  General: No scleral icterus.    Conjunctiva/sclera: Conjunctivae normal.  Cardiovascular:     Rate and Rhythm: Normal rate and regular rhythm.     Heart sounds: No murmur heard. Pulmonary:     Effort: Pulmonary effort is normal.     Breath sounds: No stridor. No wheezing, rhonchi or rales.  Abdominal:     General: Abdomen is protuberant. There is no distension.     Palpations: There is no hepatomegaly, splenomegaly or mass.     Tenderness: There is no abdominal tenderness. There is no guarding or rebound.     Hernia: No hernia is present.  Musculoskeletal:        General: Normal range of motion.     Cervical  back: Neck supple.     Right lower leg: No edema.     Left lower leg: No edema.  Lymphadenopathy:     Cervical: No cervical adenopathy.  Skin:    General: Skin is warm and dry.  Neurological:     General: No focal deficit present.     Mental Status: She is alert. Mental status is at baseline.  Psychiatric:        Attention and Perception: She is inattentive.        Mood and Affect: Mood is anxious and depressed. Affect is tearful.        Speech: Speech normal.        Behavior: Behavior normal.        Thought Content: Thought content normal. Thought content is not paranoid or delusional. Thought content does not include homicidal or suicidal ideation.        Cognition and Memory: Cognition normal.        Judgment: Judgment normal.     Lab Results  Component Value Date   WBC 8.2 02/06/2022   HGB 15.4 02/06/2022   HCT 45.3 02/06/2022   PLT 297 02/06/2022   GLUCOSE 105 (H) 02/06/2022   CHOL 218 (H) 03/01/2021   TRIG 412.0 (H) 03/14/2022   HDL 34.90 (L) 03/01/2021   LDLDIRECT 124.0 03/01/2021   LDLCALC 113 (H) 05/25/2020   ALT 27 03/14/2022   AST 33 03/14/2022   NA 139 02/06/2022   K 4.3 02/06/2022   CL 99 02/06/2022   CREATININE 0.88 02/06/2022   BUN 9 02/06/2022   CO2 23 02/06/2022   TSH 2.000 02/06/2022   INR 1.0 03/14/2022   HGBA1C 5.8 03/01/2021    No results found.  Assessment & Plan:   Autumm was seen today for hyperlipidemia.  Diagnoses and all orders for this visit:  Elevated LFTs-her liver enzymes have improved.  Testing for viral hepatitis is negative.  I recommended that she get vaccinated against hepatitis a and B. -     Hepatic function panel; Future -     Protime-INR; Future -     Hepatitis B surface antibody,quantitative; Future -     Hepatitis B surface antigen; Future -     Hepatitis A antibody, total; Future -     Hepatitis B core antibody, total; Future -     Hepatitis C antibody; Future -     Hepatitis C antibody -     Hepatitis B core  antibody, total -     Hepatitis A antibody, total -     Hepatitis B surface antigen -     Hepatitis B surface antibody,quantitative -     Protime-INR -     Hepatic function panel  Intractable chronic paroxysmal hemicrania-  Will evaluate for mass, NPH, and other lesions that would cause headaches. -     MR Brain Wo Contrast; Future  High triglycerides- Triglycerides are mildly elevated.  I encouraged her to improve her lifestyle modifications. -     Triglycerides; Future -     Triglycerides   I have discontinued Juliahna Wiswell. Rashad's topiramate. I am also having her maintain her acetaminophen, etonogestrel-ethinyl estradiol, esomeprazole, and venlafaxine XR.  No orders of the defined types were placed in this encounter.    Follow-up: Return in about 3 months (around 06/13/2022).  Sanda Linger, MD

## 2022-03-15 ENCOUNTER — Telehealth: Payer: Medicaid Other | Admitting: Psychiatry

## 2022-03-15 LAB — HEPATITIS B SURFACE ANTIBODY, QUANTITATIVE: Hep B S AB Quant (Post): 7 m[IU]/mL — ABNORMAL LOW (ref >=10)

## 2022-03-15 LAB — HEPATITIS B SURFACE ANTIGEN: Hepatitis B Surface Ag: NONREACTIVE

## 2022-03-15 LAB — HEPATITIS B CORE ANTIBODY, TOTAL: Hep B Core Total Ab: NONREACTIVE

## 2022-03-15 LAB — HEPATITIS C ANTIBODY: Hepatitis C Ab: NONREACTIVE

## 2022-03-15 LAB — HEPATITIS A ANTIBODY, TOTAL: Hepatitis A AB,Total: NONREACTIVE
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
## 2022-03-17 ENCOUNTER — Telehealth (INDEPENDENT_AMBULATORY_CARE_PROVIDER_SITE_OTHER): Payer: Medicaid Other | Admitting: Psychiatry

## 2022-03-17 ENCOUNTER — Encounter: Payer: Self-pay | Admitting: Psychiatry

## 2022-03-17 DIAGNOSIS — F330 Major depressive disorder, recurrent, mild: Secondary | ICD-10-CM

## 2022-03-17 MED ORDER — NORTRIPTYLINE HCL 10 MG PO CAPS: 10.0000 mg | ORAL_CAPSULE | Freq: Every day | ORAL | 0 refills | Status: DC

## 2022-03-17 MED ORDER — NORTRIPTYLINE HCL 25 MG PO CAPS: 25.0000 mg | ORAL_CAPSULE | Freq: Every day | ORAL | 0 refills | Status: DC

## 2022-03-17 NOTE — Patient Instructions (Signed)
Decrease venlafaxine 150 mg daily for one week, then 75 mg daily Start nortripytyline 10 mg at night for one week, then 25 mg at night  Next appointment- 2/28 at 4:30

## 2022-03-20 ENCOUNTER — Encounter: Payer: Self-pay | Admitting: Internal Medicine

## 2022-03-21 DIAGNOSIS — H5203 Hypermetropia, bilateral: Secondary | ICD-10-CM | POA: Diagnosis not present

## 2022-03-21 DIAGNOSIS — H52223 Regular astigmatism, bilateral: Secondary | ICD-10-CM | POA: Diagnosis not present

## 2022-03-27 ENCOUNTER — Other Ambulatory Visit: Payer: Self-pay | Admitting: Internal Medicine

## 2022-03-27 DIAGNOSIS — K2100 Gastro-esophageal reflux disease with esophagitis, without bleeding: Secondary | ICD-10-CM

## 2022-03-27 DIAGNOSIS — K9089 Other intestinal malabsorption: Secondary | ICD-10-CM

## 2022-03-27 MED ORDER — ESOMEPRAZOLE MAGNESIUM 40 MG PO CPDR: 40.0000 mg | DELAYED_RELEASE_CAPSULE | Freq: Every day | ORAL | 0 refills | Status: DC

## 2022-04-02 ENCOUNTER — Ambulatory Visit
Admission: RE | Admit: 2022-04-02 | Discharge: 2022-04-02 | Disposition: A | Discharge status: Home / Self Care | Payer: Medicaid Other | Source: Ambulatory Visit | Attending: Internal Medicine | Admitting: Internal Medicine

## 2022-04-02 DIAGNOSIS — G44041 Chronic paroxysmal hemicrania, intractable: Secondary | ICD-10-CM

## 2022-04-02 DIAGNOSIS — R519 Headache, unspecified: Secondary | ICD-10-CM | POA: Diagnosis not present

## 2022-04-04 ENCOUNTER — Other Ambulatory Visit: Payer: Self-pay | Admitting: Psychiatry

## 2022-04-04 ENCOUNTER — Encounter: Payer: Self-pay | Admitting: Internal Medicine

## 2022-04-04 ENCOUNTER — Other Ambulatory Visit: Payer: Self-pay | Admitting: Internal Medicine

## 2022-04-04 DIAGNOSIS — G44041 Chronic paroxysmal hemicrania, intractable: Secondary | ICD-10-CM

## 2022-04-04 DIAGNOSIS — E348 Other specified endocrine disorders: Secondary | ICD-10-CM

## 2022-04-04 NOTE — Telephone Encounter (Signed)
Could you reach out to the patient and inquire if she requires a refill of this medication? The plan was to eventually discontinue this medication, so we would prefer to refrain from ordering it unless it is deemed necessary. Thanks!

## 2022-04-04 NOTE — Telephone Encounter (Signed)
Noted, thanks!

## 2022-04-04 NOTE — Telephone Encounter (Signed)
Spoke to patient she stated that she did not request the Venlafaxine she stated that she has started the Pamelor last week she was on the 10 mg and this week she is on the 25 mg. She also mentioned that another provider sent her to get a CT scan and she would like for you to see the results of that.

## 2022-04-06 ENCOUNTER — Encounter (INDEPENDENT_AMBULATORY_CARE_PROVIDER_SITE_OTHER): Payer: Self-pay

## 2022-04-06 DIAGNOSIS — E348 Other specified endocrine disorders: Secondary | ICD-10-CM | POA: Diagnosis not present

## 2022-04-10 ENCOUNTER — Telehealth: Payer: Self-pay | Admitting: Psychiatry

## 2022-04-10 NOTE — Telephone Encounter (Signed)
I contacted the patient regarding her message. She reports experiencing increased irritability. Although she initially considered it might be related to discontinuing Effexor, it has been a few weeks since then. She is isolating herself as she acknowledges saying hurtful things to others. She denies any aggression or thoughts of self-harm. She wonders if the irritability might be related to nortriptyline, as she felt great when she forgot to take this medication. She expresses uncertainty about what to do next. She agrees with the following plans:  - Discontinue nortriptyline - Hold off on starting any new medication until her next appointment in several days - Contact the office if she has any concerns.

## 2022-04-11 ENCOUNTER — Ambulatory Visit: Payer: Medicaid Other | Admitting: Gastroenterology

## 2022-04-11 NOTE — Progress Notes (Deleted)
Jonathon Bellows MD, MRCP(U.K) [address]  [place]  [place], [place] [address]  Main: [phone]  Fax: [phone]   Gastroenterology Consultation  Referring Provider:     Janith Lima, MD Primary Care Physician:  Janith Lima, MD Primary Gastroenterologist:  Dr. Jonathon Bellows  Reason for Consultation:     Stomach Issues        HPI:   Catherine Villarreal is a 32 y.o. y/o female referred for consultation & management  by Dr. Janith Lima, MD.       02/17/2022: CBC,CMP-normal     Past Medical History:  Diagnosis Date   Anxiety and depression    Chronic headaches    Gallstones    Kidney stones    Migraine     Past Surgical History:  Procedure Laterality Date   CHOLECYSTECTOMY  2018    Prior to Admission medications   Medication Sig Start Date End Date Taking? Authorizing Provider  acetaminophen (TYLENOL) 500 MG tablet Take 1,000 mg by mouth every 6 (six) hours as needed.    [provider]  esomeprazole (NEXIUM) 40 MG capsule Take 1 capsule (40 mg total) by mouth daily. 03/27/22   Janith Lima, MD  etonogestrel-ethinyl estradiol (NUVARING) 0.12-0.015 MG/24HR vaginal ring Insert vaginally and leave in place for 3 consecutive weeks, then remove for 1 week. 06/02/21   Starr Lake, CNM  nortriptyline (PAMELOR) 10 MG capsule Take 1 capsule (10 mg total) by mouth at bedtime for 7 days. 03/17/22 03/24/22  Norman Clay, MD  nortriptyline (PAMELOR) 25 MG capsule Take 1 capsule (25 mg total) by mouth at bedtime. Start after completing 10 mg at night for 7 days 03/24/22 04/23/22  Norman Clay, MD  venlafaxine XR (EFFEXOR-XR) 150 MG 24 hr capsule Take 1 capsule (150 mg total) by mouth daily with breakfast. Take total of 225 mg daily. Take along with 75 mg cap 03/18/22 05/17/22  Norman Clay, MD  venlafaxine XR (EFFEXOR-XR) 75 MG 24 hr capsule Take 1 capsule (75 mg total) by mouth daily with breakfast. Take total of 225 mg daily 02/01/22 04/02/22  Norman Clay, MD    Family History  Problem Relation Age of Onset   Cancer Mother    Breast cancer Mother    Thyroid cancer Mother    Other Mother    Thyroid disease Mother    Berenice Primas' disease Mother    Drug abuse Brother    Stroke Father    Thyroid disease Maternal Aunt    Thyroid disease Maternal Grandmother      Social History   Tobacco Use   Smoking status: Every Day    Packs/day: 0.75    Types: Cigarettes   Smokeless tobacco: Never  Vaping Use   Vaping Use: Every day  Substance Use Topics   Alcohol use: Yes    Comment: quit Oct 2021   Drug use: Never    Allergies as of 04/11/2022 - Review Complete 03/17/2022  Allergen Reaction Noted   Topamax [topiramate] Other (See Comments) 03/14/2022   Dilaudid [hydromorphone hcl]  01/02/2017   Adhesive [tape] Rash 01/02/2017    Review of Systems:    All systems reviewed and negative except where noted in HPI.   Physical Exam:  LMP 02/28/2022 (Approximate)  Patient's last menstrual period was 02/28/2022 (approximate). Psych:  Alert and cooperative. Normal mood and affect. General:   Alert,  Well-developed, well-nourished, pleasant and cooperative in NAD Head:  Normocephalic and atraumatic. Eyes:  Sclera clear, no icterus.   Conjunctiva pink. Ears:  Normal auditory acuity. Neck:  Supple; no masses or thyromegaly. Lungs:  Respirations even and unlabored.  Clear throughout to auscultation.   No wheezes, crackles, or rhonchi. No acute distress. Heart:  Regular rate and rhythm; no murmurs, clicks, rubs, or gallops. Abdomen:  Normal bowel sounds.  No bruits.  Soft, non-tender and non-distended without masses, hepatosplenomegaly or hernias noted.  No guarding or rebound tenderness.    Neurologic:  Alert and oriented x3;  grossly normal neurologically. Psych:  Alert and cooperative. Normal mood and affect.  Imaging Studies: MR Brain Wo Contrast  Result Date: 04/03/2022 CLINICAL DATA:  Provided history: Intractable chronic  paroxysmal hemicrania. Numbness or tingling, paresthesia. Additional history provided by the scanning technologist: The patient reports migraine headaches, nausea, numbness in bilateral arms/hands/feet, vision problems, depression. EXAM: MRI HEAD WITHOUT CONTRAST TECHNIQUE: Multiplanar, multiecho pulse sequences of the brain and surrounding structures were obtained without intravenous contrast. COMPARISON:  Report from brain MRI 01/02/2019 (images unavailable). FINDINGS: Brain: Cerebral volume is normal. 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 No cortical encephalomalacia is identified. No significant cerebral white matter disease. There is no acute infarct. No evidence of an intracranial mass. No chronic intracranial blood products. No extra-axial fluid collection. No midline shift. Vascular: Maintained flow voids within the proximal large arterial vessels. Skull and upper cervical spine: No focal suspicious marrow lesion. Sinuses/Orbits: No mass or acute finding within the imaged orbits. Trace mucosal thickening scattered within bilateral ethmoid air cells. IMPRESSION: 1. No evidence of an acute intracranial abnormality. 2. 6 mm pineal cyst. 3. Otherwise unremarkable non-contrast MRI appearance of the brain. Electronically Signed   By: Kellie Simmering D.O.   On: 04/03/2022 16:18    Assessment and Plan:   Catherine Villarreal is a 32 y.o. y/o female has been referred for ***  Follow up in ***  Dr Jonathon Bellows MD,MRCP(U.K)    BP check ***

## 2022-04-12 ENCOUNTER — Other Ambulatory Visit: Payer: Self-pay

## 2022-04-13 ENCOUNTER — Encounter: Payer: Self-pay | Admitting: Gastroenterology

## 2022-04-13 ENCOUNTER — Other Ambulatory Visit: Payer: Self-pay | Admitting: Psychiatry

## 2022-04-13 ENCOUNTER — Ambulatory Visit (INDEPENDENT_AMBULATORY_CARE_PROVIDER_SITE_OTHER): Payer: Medicaid Other | Admitting: Gastroenterology

## 2022-04-13 VITALS — BP 111/79 | HR 92 | Temp 98.1°F | Ht 67.0 in | Wt 179.5 lb

## 2022-04-13 DIAGNOSIS — R109 Unspecified abdominal pain: Secondary | ICD-10-CM

## 2022-04-13 DIAGNOSIS — R140 Abdominal distension (gaseous): Secondary | ICD-10-CM | POA: Diagnosis not present

## 2022-04-13 DIAGNOSIS — R197 Diarrhea, unspecified: Secondary | ICD-10-CM | POA: Diagnosis not present

## 2022-04-13 DIAGNOSIS — Z791 Long term (current) use of non-steroidal anti-inflammatories (NSAID): Secondary | ICD-10-CM

## 2022-04-13 NOTE — Progress Notes (Signed)
Jonathon Bellows MD, MRCP(U.K) [address]  [place]  [place], [place] [address]  Main: [phone]  Fax: [phone]   Gastroenterology Consultation  Referring Provider:     Janith Lima, MD Primary Care Physician:  Janith Lima, MD Primary Gastroenterologist:  Dr. Jonathon Bellows  Reason for Consultation:     Stomach issues         HPI:   Catherine Villarreal is a 31 y.o. y/o female referred for consultation & management  by Dr. Janith Lima, MD.    She states that she is here to see me today for multiple GI issues.  She has had heartburn acid reflux for many years unfortunately has been taking her Nexium just before bedtime and hence has not had any benefit from it.  She had suffered some gas and bloating right after she eats she appears to look pregnant when she positive gas it is very foul-smelling denies any use of any artificial sugars weakness chewing gum or sodas since her gallbladder surgery she has been having diarrhea after every meal tried taking Questran 1 packet 3 times a day it got a constipated and hence she stopped taking it.  She has been taking Advil on a daily basis for headaches.  She complains of generalized abdominal discomfort on and off.    Past Medical History:  Diagnosis Date   Anxiety and depression    Chronic headaches    Gallstones    Kidney stones    Migraine     Past Surgical History:  Procedure Laterality Date   CHOLECYSTECTOMY  2018    Prior to Admission medications   Medication Sig Start Date End Date Taking? Authorizing Provider  acetaminophen (TYLENOL) 500 MG tablet Take 1,000 mg by mouth every 6 (six) hours as needed.    [provider]  esomeprazole (NEXIUM) 40 MG capsule Take 1 capsule (40 mg total) by mouth daily. 03/27/22   Janith Lima, MD  etonogestrel-ethinyl estradiol (NUVARING) 0.12-0.015 MG/24HR vaginal ring Insert vaginally and leave in place for 3 consecutive weeks, then remove for 1 week. 06/02/21   Starr Lake, CNM    Family History  Problem Relation Age of Onset   Cancer Mother    Breast cancer Mother    Thyroid cancer Mother    Other Mother    Thyroid disease Mother    Berenice Primas' disease Mother    Drug abuse Brother    Stroke Father    Thyroid disease Maternal Aunt    Thyroid disease Maternal Grandmother      Social History   Tobacco Use   Smoking status: Former    Packs/day: 0.75    Types: Cigarettes   Smokeless tobacco: Never  Vaping Use   Vaping Use: Every day  Substance Use Topics   Alcohol use: Not Currently    Comment: quit Oct 2021   Drug use: Never    Allergies as of 04/13/2022 - Review Complete 04/13/2022  Allergen Reaction Noted   Topamax [topiramate] Other (See Comments) 03/14/2022   Dilaudid [hydromorphone hcl]  01/02/2017   Adhesive [tape] Rash 01/02/2017    Review of Systems:    All systems reviewed and negative except where noted in HPI.   Physical Exam:  BP 111/79 (BP Location: Left Arm, Patient Position: Sitting, Cuff Size: Normal)   Pulse 92   Temp 98.1 F (36.7 C) (Oral)   Ht 5' 7"$  (1.702 m)   Wt 179 lb 8 oz (  81.4 kg)   LMP 02/28/2022 (Approximate)   BMI 28.11 kg/m  Patient's last menstrual period was 02/28/2022 (approximate). Psych:  Alert and cooperative. Normal mood and affect. General:   Alert,  Well-developed, well-nourished, pleasant and cooperative in NAD Head:  Normocephalic and atraumatic. Eyes:  Sclera clear, no icterus.   Conjunctiva pink. Ears:  Normal auditory acuity.  Neurologic:  Alert and oriented x3;  grossly normal neurologically. Psych:  Alert and cooperative. Normal mood and affect.  Imaging Studies: MR Brain Wo Contrast  Result Date: 04/03/2022 CLINICAL DATA:  Provided history: Intractable chronic paroxysmal hemicrania. Numbness or tingling, paresthesia. Additional history provided by the scanning technologist: The patient reports migraine headaches, nausea, numbness in bilateral arms/hands/feet, vision  problems, depression. EXAM: MRI HEAD WITHOUT CONTRAST TECHNIQUE: Multiplanar, multiecho pulse sequences of the brain and surrounding structures were obtained without intravenous contrast. COMPARISON:  Report from brain MRI 01/02/2019 (images unavailable). FINDINGS: Brain: Cerebral volume is normal. 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 No cortical encephalomalacia is identified. No significant cerebral white matter disease. There is no acute infarct. No evidence of an intracranial mass. No chronic intracranial blood products. No extra-axial fluid collection. No midline shift. Vascular: Maintained flow voids within the proximal large arterial vessels. Skull and upper cervical spine: No focal suspicious marrow lesion. Sinuses/Orbits: No mass or acute finding within the imaged orbits. Trace mucosal thickening scattered within bilateral ethmoid air cells. IMPRESSION: 1. No evidence of an acute intracranial abnormality. 2. 6 mm pineal cyst. 3. Otherwise unremarkable non-contrast MRI appearance of the brain. Electronically Signed   By: Kellie Simmering D.O.   On: 04/03/2022 16:18    Assessment and Plan:   Catherine Villarreal is a 32 y.o. 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  She has not been taking Questran as it made her constipated likely she has been taking high-dose.  Gas and bloating likely has IBS diarrhea or small bowel bacterial overgrowth syndrome.  Long-term use of NSAIDs likely has NSAID induced gastritis or colitis or both   Plan 1.  Celiac serology, H. pylori breath test, alpha gal panel, diarrhea 2.  Stop all NSAID use 3.  Start taking the Nexium 40 mg once a day first thing in the morning on an empty stomach 5.  Low FODMAP diet 6.  Charcoal tablets use as needed 7.  If no better at next visit would require endoscopy and colonoscopy 8.  Take Questran half a  packet of 1 packet/day  Follow up in 6 weeks video visit  Dr Jonathon Bellows MD,MRCP(U.K)

## 2022-04-13 NOTE — Patient Instructions (Addendum)
Please get charcoal tablet over the counter  Low-FODMAP Eating Plan  FODMAP stands for fermentable oligosaccharides, disaccharides, monosaccharides, and polyols. These are sugars that are hard for some people to digest. A low-FODMAP eating plan may help some people who have irritable bowel syndrome (IBS) and certain other bowel (intestinal) diseases to manage their symptoms. This meal plan can be complicated to follow. Work with a diet and nutrition specialist (dietitian) to make a low-FODMAP eating plan that is right for you. A dietitian can help make sure that you get enough nutrition from this diet. What are tips for following this plan? Reading food labels Check labels for hidden FODMAPs such as: High-fructose syrup. Honey. Agave. Natural fruit flavors. Onion or garlic powder. Choose low-FODMAP foods that contain 3-4 grams of fiber per serving. Check food labels for serving sizes. Eat only one serving at a time to make sure FODMAP levels stay low. Shopping Shop with a list of foods that are recommended on this diet and make a meal plan. Meal planning Follow a low-FODMAP eating plan for up to 6 weeks, or as told by your health care provider or dietitian. To follow the eating plan: Eliminate high-FODMAP foods from your diet completely. Choose only low-FODMAP foods to eat. You will do this for 2-6 weeks. Gradually reintroduce high-FODMAP foods into your diet one at a time. Most people should wait a few days before introducing the next new high-FODMAP food into their meal plan. Your dietitian can recommend how quickly you may reintroduce foods. Keep a daily record of what and how much you eat and drink. Make note of any symptoms that you have after eating. Review your daily record with a dietitian regularly to identify which foods you can eat and which foods you should avoid. General tips Drink enough fluid each day to keep your urine pale yellow. Avoid processed foods. These often have  added sugar and may be high in FODMAPs. Avoid most dairy products, whole grains, and sweeteners. Work with a dietitian to make sure you get enough fiber in your diet. Avoid high FODMAP foods at meals to manage symptoms. Recommended foods Fruits Bananas, oranges, tangerines, lemons, limes, blueberries, raspberries, strawberries, grapes, cantaloupe, honeydew melon, kiwi, papaya, passion fruit, and pineapple. Limited amounts of dried cranberries, banana chips, and shredded coconut. Vegetables Eggplant, zucchini, cucumber, peppers, green beans, bean sprouts, lettuce, arugula, kale, Swiss chard, spinach, collard greens, bok choy, summer squash, potato, and tomato. Limited amounts of corn, carrot, and sweet potato. Green parts of scallions. Grains Gluten-free grains, such as rice, oats, buckwheat, quinoa, corn, polenta, and millet. Gluten-free pasta, bread, or cereal. Rice noodles. Corn tortillas. Meats and other proteins Unseasoned beef, pork, poultry, or fish. Eggs. Berniece Salines. Tofu (firm) and tempeh. Limited amounts of nuts and seeds, such as almonds, walnuts, [place] nuts, pecans, peanuts, nut butters, pumpkin seeds, chia seeds, and sunflower seeds. Dairy Lactose-free milk, yogurt, and kefir. Lactose-free cottage cheese and ice cream. Non-dairy milks, such as almond, coconut, hemp, and rice milk. Non-dairy yogurt. Limited amounts of goat cheese, brie, mozzarella, parmesan, swiss, and other hard cheeses. Fats and oils Butter-free spreads. Vegetable oils, such as olive, canola, and sunflower oil. Seasoning and other foods Artificial sweeteners with names that do not end in "ol," such as aspartame, saccharine, and stevia. Maple syrup, white table sugar, raw sugar, brown sugar, and molasses. Mayonnaise, soy sauce, and tamari. Fresh basil, coriander, parsley, rosemary, and thyme. Beverages Water and mineral water. Sugar-sweetened soft drinks. Small amounts of orange juice or cranberry  juice. Black and green  tea. Most dry wines. Coffee. The items listed above may not be a complete list of foods and beverages you can eat. Contact a dietitian for more information. Foods to avoid Fruits Fresh, dried, and juiced forms of apple, pear, watermelon, peach, plum, cherries, apricots, blackberries, boysenberries, figs, nectarines, and mango. Avocado. Vegetables Chicory root, artichoke, asparagus, cabbage, snow peas, Brussels sprouts, broccoli, sugar snap peas, mushrooms, celery, and cauliflower. Onions, garlic, leeks, and the white part of scallions. Grains Wheat, including kamut, durum, and semolina. Barley and bulgur. Couscous. Wheat-based cereals. Wheat noodles, bread, crackers, and pastries. Meats and other proteins Fried or fatty meat. Sausage. Cashews and pistachios. Soybeans, baked beans, black beans, chickpeas, kidney beans, fava beans, navy beans, lentils, black-eyed peas, and split peas. Dairy Milk, yogurt, ice cream, and soft cheese. Cream and sour cream. Milk-based sauces. Custard. Buttermilk. Soy milk. Seasoning and other foods Any sugar-free gum or candy. Foods that contain artificial sweeteners such as sorbitol, mannitol, isomalt, or xylitol. Foods that contain honey, high-fructose corn syrup, or agave. Bouillon, vegetable stock, beef stock, and chicken stock. Garlic and onion powder. Condiments made with onion, such as hummus, chutney, pickles, relish, salad dressing, and salsa. Tomato paste. Beverages Chicory-based drinks. Coffee substitutes. Chamomile tea. Fennel tea. Sweet or fortified wines such as port or sherry. Diet soft drinks made with isomalt, mannitol, maltitol, sorbitol, or xylitol. Apple, pear, and mango juice. Juices with high-fructose corn syrup. The items listed above may not be a complete list of foods and beverages you should avoid. Contact a dietitian for more information. Summary FODMAP stands for fermentable oligosaccharides, disaccharides, monosaccharides, and polyols. These  are sugars that are hard for some people to digest. A low-FODMAP eating plan is a short-term diet that helps to ease symptoms of certain bowel diseases. The eating plan usually lasts up to 6 weeks. After that, high-FODMAP foods are reintroduced gradually and one at a time. This can help you find out which foods may be causing symptoms. A low-FODMAP eating plan can be complicated. It is best to work with a dietitian who has experience with this type of plan. This information is not intended to replace advice given to you by your health care provider. Make sure you discuss any questions you have with your health care provider. Document Revised: 06/26/2019 Document Reviewed: 06/26/2019 Elsevier Patient Education  [place].

## 2022-04-14 DIAGNOSIS — R140 Abdominal distension (gaseous): Secondary | ICD-10-CM | POA: Diagnosis not present

## 2022-04-14 DIAGNOSIS — R109 Unspecified abdominal pain: Secondary | ICD-10-CM | POA: Diagnosis not present
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
## 2022-04-16 LAB — CELIAC DISEASE PANEL
Endomysial IgA: NEGATIVE
IgA/Immunoglobulin A, Serum: 146 mg/dL (ref 87–352)
Transglutaminase IgA: <2 U/mL (ref 0–3)

## 2022-04-17 DIAGNOSIS — R197 Diarrhea, unspecified: Secondary | ICD-10-CM | POA: Diagnosis not present

## 2022-04-17 DIAGNOSIS — R109 Unspecified abdominal pain: Secondary | ICD-10-CM | POA: Diagnosis not present

## 2022-04-17 DIAGNOSIS — R140 Abdominal distension (gaseous): Secondary | ICD-10-CM | POA: Diagnosis not present

## 2022-04-18 LAB — H PYLORI BREATH TEST: H pylori Breath Test: NEGATIVE

## 2022-04-19 ENCOUNTER — Telehealth (INDEPENDENT_AMBULATORY_CARE_PROVIDER_SITE_OTHER): Payer: Medicaid Other | Admitting: Psychiatry

## 2022-04-19 ENCOUNTER — Encounter: Payer: Self-pay | Admitting: Psychiatry

## 2022-04-19 DIAGNOSIS — R41840 Attention and concentration deficit: Secondary | ICD-10-CM | POA: Diagnosis not present

## 2022-04-19 DIAGNOSIS — F330 Major depressive disorder, recurrent, mild: Secondary | ICD-10-CM

## 2022-04-19 LAB — H. PYLORI ANTIGEN, STOOL: H pylori Ag, Stl: NEGATIVE

## 2022-04-19 NOTE — Patient Instructions (Signed)
Discontinue nortriptyline Next appointment- 4/10 at 3pm

## 2022-04-21 LAB — GI PROFILE, STOOL, PCR

## 2022-04-21 LAB — CALPROTECTIN, FECAL: Calprotectin, Fecal: 75 ug/g (ref 0–120)

## 2022-04-21 LAB — PANCREATIC ELASTASE, FECAL: Pancreatic Elastase, Fecal: 313 ug Elast./g (ref >200)

## 2022-04-22 NOTE — Progress Notes (Deleted)
   GYNECOLOGY OFFICE VISIT NOTE  History:   Catherine Villarreal is a 32 y.o. G2P1011 here today for painful and heavy periods. She had a Paragard IUD placed in 2019. She had this concern last year and had the Paragard removed and then was prescribed Nuvaring but had been hesitant to do anything hormonal. She has not yet had any imaging of her pelvis. ***  She does have an intermittent history of BV.   She has history of mom having breast cancer at age 60. The patient had genetic testing for breast cancer gene mutation and it was negative.   She denies any abnormal vaginal discharge, bleeding, pelvic pain or other concerns.     Past Medical History:  Diagnosis Date   Anxiety and depression    Chronic headaches    Gallstones    Kidney stones    Migraine     Past Surgical History:  Procedure Laterality Date   CHOLECYSTECTOMY  2018    The following portions of the patient's history were reviewed and updated as appropriate: allergies, current medications, past family history, past medical history, past social history, past surgical history and problem list.   Health Maintenance:   Normal pap and negative HRHPV: Diagnosis  Date Value Ref Range Status  06/02/2021   Final   - Negative for intraepithelial lesion or malignancy (NILM)    Review of Systems:  Pertinent items noted in HPI and remainder of comprehensive ROS otherwise negative.  Physical Exam:  There were no vitals taken for this visit. CONSTITUTIONAL: Well-developed, well-nourished female in no acute distress.  HEENT:  Normocephalic, atraumatic. External right and left ear normal. No scleral icterus.  NECK: Normal range of motion, supple, no masses noted on observation SKIN: No rash noted. Not diaphoretic. No erythema. No pallor. MUSCULOSKELETAL: Normal range of motion. No edema noted. NEUROLOGIC: Alert and oriented to person, place, and time. Normal muscle tone coordination. No cranial nerve deficit noted. PSYCHIATRIC:  Normal mood and affect. Normal behavior. Normal judgment and thought content.  CARDIOVASCULAR: Normal heart rate noted RESPIRATORY: Effort and breath sounds normal, no problems with respiration noted ABDOMEN: No masses noted. No other overt distention noted.    PELVIC: {Blank single:19197::"Deferred","Normal appearing external genitalia; normal urethral meatus; normal appearing vaginal mucosa and cervix.  No abnormal discharge noted.  Normal uterine size, no other palpable masses, no uterine or adnexal tenderness. Performed in the presence of a chaperone"}  Assessment and Plan:   1. Painful menstrual periods ***  2. Menorrhagia with regular cycle ***    Diagnoses and all orders for this visit:  Painful menstrual periods  Menorrhagia with regular cycle    Routine preventative health maintenance measures emphasized. Please refer to After Visit Summary for other counseling recommendations.   No follow-ups on file.  Radene Gunning, MD, [place] for [facility] [place]

## 2022-04-26 ENCOUNTER — Ambulatory Visit: Payer: Medicaid Other | Admitting: Obstetrics and Gynecology

## 2022-04-26 DIAGNOSIS — N946 Dysmenorrhea, unspecified: Secondary | ICD-10-CM

## 2022-04-26 DIAGNOSIS — N920 Excessive and frequent menstruation with regular cycle: Secondary | ICD-10-CM

## 2022-05-08 ENCOUNTER — Encounter: Payer: Self-pay | Admitting: Internal Medicine

## 2022-05-25 ENCOUNTER — Other Ambulatory Visit: Payer: Self-pay

## 2022-05-25 ENCOUNTER — Ambulatory Visit (INDEPENDENT_AMBULATORY_CARE_PROVIDER_SITE_OTHER): Payer: Medicaid Other | Admitting: Gastroenterology

## 2022-05-25 ENCOUNTER — Encounter: Payer: Self-pay | Admitting: Gastroenterology

## 2022-05-25 VITALS — BP 135/83 | HR 78 | Temp 98.1°F | Wt 177.8 lb

## 2022-05-25 DIAGNOSIS — K580 Irritable bowel syndrome with diarrhea: Secondary | ICD-10-CM | POA: Diagnosis not present

## 2022-05-25 MED ORDER — RIFAXIMIN 550 MG PO TABS: 550.0000 mg | ORAL_TABLET | Freq: Two times a day (BID) | ORAL | 11 refills | Status: DC

## 2022-05-25 NOTE — Addendum Note (Signed)
Addended by: Wayna Chalet on: 05/25/2022 03:09 PM   Modules accepted: Orders

## 2022-05-25 NOTE — Patient Instructions (Signed)
Lactose-Controlled Eating Plan, Adult Lactose intolerance is when the body is not able to digest lactose, a natural sugar that is found in milk and milk products. If you are lactose intolerant, avoiding or limiting foods and drinks that contain lactose may help ease digestive problems such as diarrhea or stomach pain. What are tips for following this plan? Reading food labels Check food ingredient lists carefully. Avoid foods made with butter, cream, milk, milk solids, milk powder, curd, caseinate, or whey. Avoid products with the note "may contain milk." Look for the words "lactose-free" or "lactose-reduced" on food labels. You can eat lactose-free foods, and you may be able to eat small amounts of lactose-reduced foods. Shopping Look for nondairy substitutes, such as: Nondairy creamer. Nondairy milks, such as almond, soy, rice, or coconut milk. Soy, almond, or coconut yogurt. Dairy-free cheese. Buy lactose-free cow milk. Cooking Avoid cooking with butter. Use vegetable, nut, and seed oils instead. Prepare soups without cream. Use other products to thicken soups, such as cornstarch or tomato paste. Meal planning  Avoid eating foods that contain lactose. Some people with lactose intolerance can eat foods that contain small amounts of lactose. Foods that contain less than 1 gram of lactose per serving include: 1-2 oz (28-56 g) aged cheese, such as Parmesan, Swiss, or cheddar. 2 Tbsp (29 g) cream cheese. ? cup (75 g) cottage cheese.  cup (124 g) ricotta cheese. Some people are able to tolerate cultured dairy products, such as yogurt, buttermilk, and kefir. The healthy bacteria in these products helps you digest lactose. If you decide to try a food that contains lactose: Eat only one food with lactose in it at a time. Eat only a small amount of the food. Stop eating the food if your symptoms return. Getting enough calcium Milk and milk products contain a lot of calcium, which is an  important nutrient for your health. When you avoid milk and milk products, make sure to get calcium from other foods. Talk with your health care provider about how much calcium you need each day. The amount of calcium you need each day depends on your age and overall health. Nondairy foods that are high in calcium include: Sardines and canned salmon. Dried beans. Almonds. Turnip greens, collards, kale, and broccoli. Calcium-fortified soy milk and tofu. Calcium-fortified orange juice. Talk with your health care provider about vitamin and mineral supplements. Take supplements only as directed. Where to find more information National Institute of Diabetes and Digestive and Kidney Diseases: www.niddk.nih.gov Summary Avoiding or limiting foods and drinks that contain lactose may help ease digestive problems such as diarrhea or stomach pain. When you avoid milk and milk products, make sure to get calcium from other foods. Take vitamin and mineral supplements only as directed by your health care provider. This information is not intended to replace advice given to you by your health care provider. Make sure you discuss any questions you have with your health care provider. Document Revised: 01/13/2020 Document Reviewed: 01/13/2020 Elsevier Patient Education  2023 Elsevier Inc.  

## 2022-05-25 NOTE — Progress Notes (Signed)
Catherine Bellows MD, MRCP(U.K) [address]  [place]  [place], [place] [address]  Main: [phone]  Fax: [phone]   Primary Care Physician: Janith Lima, MD  Primary Gastroenterologist:  Dr. Jonathon Villarreal   Chief Complaint  Patient presents with   Diarrhea    HPI: Catherine Villarreal is a 32 y.o. female   Summary of history :  Initially: Seen on 04/13/2022 for multiple GI issues.She has had heartburn acid reflux for many years unfortunately has been taking her Nexium just before bedtime and hence has not had any benefit from it. She had suffered some gas and bloating right after she eats she appears to look pregnant when she positive gas it is very foul-smelling denies any use of any artificial sugars , chewing gum or sodas since her gallbladder surgery she has been having diarrhea after every meal tried taking Questran 1 packet 3 times a day it got a constipated and hence she stopped taking it. She has been taking Advil on a daily basis for headaches. She complains of generalized abdominal discomfort on and off.   Interval history   04/13/2022-05/25/2022   04/17/2022 stool studies including fecal calprotectin, pancreatic elastase H. pylori is stool antigen breath test negative.   The last visit she started the low FODMAP diet she has been trying to do it as diligently as possible and has noted a significant benefit she has particularly noticed that when she consumes dairy particularly cheeses she has significant bloating and gas.  She has been taking her PPI and has had fewer reflux symptoms.  She has cut down artificial sugars and sweeteners from her diet and also limited the use of NSAIDs unless absolutely needed.  Present issues are only on and off diarrhea as well as bloating and lower abdominal distention.  She says she has been tested for alpha gal and has been negative in the past.  Current Outpatient Medications  Medication Sig Dispense Refill   acetaminophen (TYLENOL) 500  MG tablet Take 1,000 mg by mouth every 6 (six) hours as needed.     Cholecalciferol 125 MCG (5000 UT) TABS Take 1 tablet by mouth daily.     esomeprazole (NEXIUM) 40 MG capsule Take 1 capsule (40 mg total) by mouth daily. 90 capsule 0   etonogestrel-ethinyl estradiol (NUVARING) 0.12-0.015 MG/24HR vaginal ring Insert vaginally and leave in place for 3 consecutive weeks, then remove for 1 week. 1 each 12   Nicotine 21-14-7 MG/24HR KIT APPLY 1 PATCH ONTO THE SKIN EVERY DAY     paragard intrauterine copper IUD IUD 1 each by Intrauterine route once.     progesterone (PROMETRIUM) 200 MG capsule Take 200 mg by mouth daily.     thyroid (NP THYROID) 120 MG tablet Take 120 mg by mouth daily before breakfast.     venlafaxine XR (EFFEXOR-XR) 150 MG 24 hr capsule Take 150 mg by mouth daily with breakfast.     No current facility-administered medications for this visit.    Allergies as of 05/25/2022 - Review Complete 05/25/2022  Allergen Reaction Noted   Topamax [topiramate] Other (See Comments) 03/14/2022   Dilaudid [hydromorphone hcl]  01/02/2017   Adhesive [tape] Rash 01/02/2017    ROS:  General: Negative for anorexia, weight loss, fever, chills, fatigue, weakness. ENT: Negative for hoarseness, difficulty swallowing , nasal congestion. CV: Negative for chest pain, angina, palpitations, dyspnea on exertion, peripheral edema.  Respiratory: Negative for dyspnea at rest, dyspnea on exertion, cough, sputum, wheezing.  GI: See history of present illness. GU:  Negative for dysuria, hematuria, urinary incontinence, urinary frequency, nocturnal urination.  Endo: Negative for unusual weight change.    Physical Examination:   BP 135/83   Pulse 78   Temp 98.1 F (36.7 C) (Oral)   Wt 177 lb 12.8 oz (80.6 kg)   BMI 27.85 kg/m   General: Well-nourished, well-developed in no acute distress.  Eyes: No icterus. Conjunctivae pink. Mouth: Oropharyngeal mucosa moist and pink , no lesions erythema or  exudate. Neuro: Alert and oriented x 3.  Grossly intact. Skin: Warm and dry, no jaundice.   Psych: Alert and cooperative, normal mood and affect.   Imaging Studies: No results found.  Assessment and Plan:   [name] is a 32 y.o. y/o female here to follow-up.  She has multiple GI symptoms likely a combination of reflux, effect of NSAIDs, and effect of diet rich in FODMAPs in the past, artificial sugars in her diet.  History suggestive that she is lactose intolerant.  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  Xifaxan as a trial for IBS diarrhea 2.  Continue low FODMAP diet as much as possible 3.  She may be lactose intolerant suggested Lactaid pills as well as limiting excess consumption of cheese and ice cream which are high in lactose content. 4.  Avoid NSAIDs   Dr Catherine Bellows  MD,MRCP [facility]) Follow up in as needed

## 2022-05-29 ENCOUNTER — Telehealth: Payer: Self-pay | Admitting: Gastroenterology

## 2022-05-29 ENCOUNTER — Telehealth: Payer: Self-pay

## 2022-05-29 DIAGNOSIS — Z6828 Body mass index (BMI) 28.0-28.9, adult: Secondary | ICD-10-CM | POA: Diagnosis not present

## 2022-05-29 DIAGNOSIS — E663 Overweight: Secondary | ICD-10-CM | POA: Diagnosis not present

## 2022-05-29 DIAGNOSIS — H6503 Acute serous otitis media, bilateral: Secondary | ICD-10-CM | POA: Diagnosis not present

## 2022-05-29 NOTE — Telephone Encounter (Signed)
Pt  called requesting a cheaper antibiotic then what was prescribed on 04 04 2024

## 2022-05-29 NOTE — Telephone Encounter (Signed)
Pt  called requesting a cheaper antibiotic then what was prescribed on 04 04 2024 

## 2022-05-29 NOTE — Telephone Encounter (Signed)
Lets give her a course of doxycycline 100 mg BID for 14 days, avoid excess exposure to direct sunlight

## 2022-05-29 NOTE — Telephone Encounter (Signed)
Called patient's insurance (Medicaid) to ask if she was approved for the Xifaxan 550 MG for the 14 days and I was told that it was denied. They stated that the patient needed to try 2 of the following before being able to take Xifaxin. Dicyclomine Amitriptyline  Allopurinol  Please advise.
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
## 2022-05-30 MED ORDER — DOXYCYCLINE MONOHYDRATE 100 MG PO TABS: 100.0000 mg | ORAL_TABLET | Freq: Two times a day (BID) | ORAL | 0 refills | Status: AC

## 2022-05-30 NOTE — Telephone Encounter (Signed)
Please read the patient phone call from 05/29/2022.

## 2022-05-30 NOTE — Telephone Encounter (Signed)
Called patient back to let her know that Dr. Tobi Bastos would be sending her a new antibiotic and that hopefully it would be cheaper for her. Patient stated that she would go and pick it up later on today.

## 2022-05-31 ENCOUNTER — Telehealth (INDEPENDENT_AMBULATORY_CARE_PROVIDER_SITE_OTHER): Payer: Medicaid Other | Admitting: Psychiatry

## 2022-05-31 ENCOUNTER — Encounter: Payer: Self-pay | Admitting: Psychiatry

## 2022-05-31 DIAGNOSIS — F330 Major depressive disorder, recurrent, mild: Secondary | ICD-10-CM | POA: Diagnosis not present

## 2022-05-31 MED ORDER — LAMOTRIGINE 25 MG PO TABS: 25.0000 mg | ORAL_TABLET | Freq: Every day | ORAL | 1 refills | Status: DC

## 2022-05-31 NOTE — Patient Instructions (Signed)
Start lamotrigine 25 mg daily  Next appointment- 5/22 at 11 am

## 2022-06-05 NOTE — Telephone Encounter (Signed)
Called patient to let her know that her insurance did not approve the Xifaxan even after appealing. Therefore, they recommended for her to try Imodium when she had diarrhea. I let Dr. Tobi Bastos know and he stated that we had to go with what the insurance says. I then called patient to let her know what to take when she had diarrhea and she stated that she would go ahead and try taking that when she had diarrhea and patient understood.

## 2022-06-06 ENCOUNTER — Ambulatory Visit (INDEPENDENT_AMBULATORY_CARE_PROVIDER_SITE_OTHER): Payer: Medicaid Other | Admitting: Obstetrics and Gynecology

## 2022-06-06 ENCOUNTER — Other Ambulatory Visit (HOSPITAL_COMMUNITY)
Admission: RE | Admit: 2022-06-06 | Discharge: 2022-06-06 | Disposition: A | Discharge status: Home / Self Care | Payer: Medicaid Other | Source: Ambulatory Visit | Attending: Obstetrics and Gynecology | Admitting: Obstetrics and Gynecology

## 2022-06-06 ENCOUNTER — Encounter: Payer: Self-pay | Admitting: Obstetrics and Gynecology

## 2022-06-06 ENCOUNTER — Other Ambulatory Visit: Payer: Self-pay

## 2022-06-06 VITALS — BP 122/91 | HR 65 | Wt 179.1 lb

## 2022-06-06 DIAGNOSIS — N946 Dysmenorrhea, unspecified: Secondary | ICD-10-CM | POA: Insufficient documentation

## 2022-06-06 DIAGNOSIS — R102 Pelvic and perineal pain: Secondary | ICD-10-CM | POA: Diagnosis not present

## 2022-06-06 DIAGNOSIS — N920 Excessive and frequent menstruation with regular cycle: Secondary | ICD-10-CM

## 2022-06-06 DIAGNOSIS — M791 Myalgia, unspecified site: Secondary | ICD-10-CM

## 2022-06-06 DIAGNOSIS — G8929 Other chronic pain: Secondary | ICD-10-CM | POA: Diagnosis not present

## 2022-06-06 LAB — POCT PREGNANCY, URINE: Preg Test, Ur: NEGATIVE

## 2022-06-06 MED ORDER — IBUPROFEN 800 MG PO TABS
800.0000 mg | ORAL_TABLET | Freq: Once | ORAL | Status: AC
  Administered 2022-06-06: 800 mg via ORAL

## 2022-06-06 NOTE — Addendum Note (Signed)
Addended by: Brien Mates T on: 06/06/2022 03:54 PM   Modules accepted: Orders

## 2022-06-06 NOTE — Progress Notes (Signed)
GYNECOLOGY VISIT  Patient name: Catherine Villarreal MRN [id number]  Date of birth: [birth year] Chief Complaint:   Dysmenorrhea  History:  Catherine Villarreal is a 32 y.o. G2P1011 being seen today for dysmenorrhea.    Currently using NuvaRing for contraception. Having "deep tissue" pain x2 weeks, L > R. Pain has improved. Currently on ABX for abdominal pain and concerned about post ABX yeast infection. Reports "extreme" cramping in 'ovaries', hips and midback. Had Cu-IUD after delivery due to having issues with taking pills daily. Had Cu-IUD removed and for 1-2 months, menses were improved. May be out for 3-5 days due to pain. Does not endorse pain in between menses. Experiences "ovary" pain - lower abodminal region and lower back. Stabbing pain or non stop. No pain with urination  or blood in urine. Seeing GI for bowel issues. Sexually active - pain with intercourse; similar to pain on menses or stabbing pain with penetration.   Interested in hysterectomy for dysmenorrhea. APAP, NSAIDs and muscle relaxer have not helped. Menses are heavy and have blood clots and thinks she has passed a uterine. Interested in total hysterectomy including ovaries.    Past Medical History:  Diagnosis Date   Anxiety and depression    Chronic headaches    Gallstones    Kidney stones    Migraine     Past Surgical History:  Procedure Laterality Date   CHOLECYSTECTOMY  2018    The following portions of the patient's history were reviewed and updated as appropriate: allergies, current medications, past family history, past medical history, past social history, past surgical history and problem list.   Health Maintenance:   Last pap     Component Value Date/Time   DIAGPAP  06/02/2021 1052    - Negative for intraepithelial lesion or malignancy (NILM)   DIAGPAP  08/17/2017 0000    NEGATIVE FOR INTRAEPITHELIAL LESIONS OR MALIGNANCY.   HPVHIGH Negative 06/02/2021 1052   ADEQPAP  06/02/2021 1052    Satisfactory for  evaluation; transformation zone component PRESENT.   ADEQPAP  08/17/2017 0000    Satisfactory for evaluation  endocervical/transformation zone component ABSENT.   Last mammogram: n/a   Review of Systems:  Pertinent items are noted in HPI. Comprehensive review of systems was otherwise negative.   Objective:  Physical Exam Wt 179 lb 1.6 oz (81.2 kg)   BMI 28.05 kg/m    Physical Exam Vitals and nursing note reviewed. Exam conducted with a chaperone present.  Constitutional:      Appearance: Normal appearance.  HENT:     Head: Normocephalic and atraumatic.  Pulmonary:     Effort: Pulmonary effort is normal.     Breath sounds: Normal breath sounds.  Abdominal:     Comments: Well healed horizontal supraumbilical incision + carnett in RLQ, - carnett in LLQ No allodynia present   Genitourinary:    General: Normal vulva.     Exam position: Lithotomy position.     Vagina: Normal.     Cervix: Normal.     Comments: Normal appearing vulva Normal vulvar sensation bilaterally Nontender superficial pelvic floor muscles Nontender ischial tuberosities bilaterally  Allodynia at introitus: Yes: 6 oclock  Anal wink present Posterior vaginal wall nontender Right levator ani 1/10 Right ischiococcygeous 2/10 Right obturator internus 2/10 Left levator ani 2/10 Left ischioccocygeous 3/10 Left obturator internus 3/10 Anterior vaginal wall tender Uterine tenderness nontender   Skin:    General: Skin is warm and dry.  Neurological:     General:  No focal deficit present.     Mental Status: She is alert.  Psychiatric:        Mood and Affect: Mood normal.        Behavior: Behavior normal.        Thought Content: Thought content normal.        Judgment: Judgment normal.    Endometrial Biopsy Procedure  Patient identified, informed consent performed,  indication reviewed, consent signed.  Reviewed risk of perforation, pain, bleeding, insufficient sample, etc were reviewd. Time out was  performed.  Urine pregnancy test negative.  Speculum placed in the vagina.  Cervix visualized.  Cleaned with Betadine x 2.  Paracervical block was not administered.  Endometrial pipelle was used to draw up 1cc of 1% lidocaine, introduced into the cervical os and instilled into the endometrial cavity.  The pipelle was passed twice without difficulty and sample obtained Tenaculum was removed, good hemostasis noted.  Patient tolerated procedure well.  Patient was given post-procedure instructions.     Assessment & Plan:   1. Menorrhagia with regular cycle Patient has abnormal uterine bleeding . She has a normal exam, no evidence of lesions.  Will order abnormal uterine bleeding evaluation labs and pelvic ultrasound to evaluate for any structural gynecologic abnormalities.  Will contact patient with these results and plans for further evaluation/management. Recommend continuous nuvaring use for menstrual suppression as most pain is during menses.  - US PELVIC COMPLETE WITH TRANSVAGINAL; Future - Surgical pathology - HgB A1c - Prolactin  2. Dysmenorrhea 3. Myalgia 4. Chronic pelvic pain in female Reviewed that hysterectomy is definitive management of heavy menses and dysmenorrhea but may not resolve non-cyclical pelvic pain. Noted that with history of dysmenorrhea and GI issues additional pain likely due to pelvic dysfunction. Evidence of myalgia on exam with nontender adnexa. Will follow up pelvic US. At this time would not recommend bilateral oophorectomy. Reviewed risks associated with premenopausal oophorectomy.  - Surgical pathology - Ambulatory referral to Physical Therapy   Lorriane Shire, MD Minimally Invasive Gynecologic Surgery [facility], [facility]

## 2022-06-07 LAB — HEMOGLOBIN A1C
Est. average glucose Bld gHb Est-mCnc: 111 mg/dL
Hgb A1c MFr Bld: 5.5 % (ref 4.8–5.6)

## 2022-06-07 LAB — PROLACTIN: Prolactin: 6.8 ng/mL (ref 4.8–33.4)

## 2022-06-07 LAB — SURGICAL PATHOLOGY

## 2022-06-20 ENCOUNTER — Ambulatory Visit (HOSPITAL_COMMUNITY): Payer: Medicaid Other

## 2022-06-21 ENCOUNTER — Ambulatory Visit (HOSPITAL_COMMUNITY): Admission: RE | Admit: 2022-06-21 | Payer: Medicaid Other | Source: Ambulatory Visit

## 2022-06-30 ENCOUNTER — Other Ambulatory Visit: Payer: Self-pay | Admitting: Internal Medicine

## 2022-06-30 ENCOUNTER — Telehealth: Payer: Self-pay

## 2022-06-30 DIAGNOSIS — K2100 Gastro-esophageal reflux disease with esophagitis, without bleeding: Secondary | ICD-10-CM

## 2022-06-30 NOTE — Telephone Encounter (Signed)
Need refill of Nuvaring

## 2022-07-03 ENCOUNTER — Other Ambulatory Visit: Payer: Self-pay | Admitting: Lactation Services

## 2022-07-03 MED ORDER — ETONOGESTREL-ETHINYL ESTRADIOL 0.12-0.015 MG/24HR VA RING: VAGINAL_RING | VAGINAL | 12 refills | Status: DC

## 2022-07-03 NOTE — Telephone Encounter (Signed)
Called patient to inform her that Nuvaring has been reordered and that she is to use the Nuvaring for 4 consecutive weeks and then change out. She voiced understanding.

## 2022-07-03 NOTE — Progress Notes (Signed)
Nuvaring reordered per Dr. Briscoe Deutscher at patients request for refill.

## 2022-07-04 ENCOUNTER — Ambulatory Visit (HOSPITAL_COMMUNITY)
Admission: RE | Admit: 2022-07-04 | Discharge: 2022-07-04 | Disposition: A | Discharge status: Home / Self Care | Payer: Medicaid Other | Source: Ambulatory Visit | Attending: Obstetrics and Gynecology | Admitting: Obstetrics and Gynecology

## 2022-07-04 DIAGNOSIS — N920 Excessive and frequent menstruation with regular cycle: Secondary | ICD-10-CM | POA: Diagnosis present

## 2022-07-04 DIAGNOSIS — N939 Abnormal uterine and vaginal bleeding, unspecified: Secondary | ICD-10-CM | POA: Diagnosis not present

## 2022-07-11 NOTE — Therapy (Unsigned)
OUTPATIENT PHYSICAL THERAPY FEMALE PELVIC EVALUATION   Patient Name: ANACELIA MEIKLE MRN: [id number] DOB:Feb 17, 1991, 32 y.o., female Today's Date: 07/12/2022  END OF SESSION:  PT End of Session - 07/12/22 1035     Visit Number 1    Date for PT Re-Evaluation 10/04/22    Authorization Type Medicaid prepaid [employer] - no auth required    PT Start Time 1005    PT Stop Time 1050    PT Time Calculation (min) 45 min             Past Medical History:  Diagnosis Date   Anxiety and depression    Chronic headaches    Gallstones    Kidney stones    Migraine    Past Surgical History:  Procedure Laterality Date   CHOLECYSTECTOMY  2018   Patient Active Problem List   Diagnosis Date Noted   Cyst of pineal gland 04/04/2022   Elevated LFTs 03/14/2022   Intractable chronic paroxysmal hemicrania 03/14/2022   High triglycerides 03/14/2022   Gastroesophageal reflux disease with esophagitis without hemorrhage 12/28/2021   Pure hypertriglyceridemia 03/01/2021   Chronic hyperglycemia 03/01/2021   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) 01/10/2021   Recurrent nephrolithiasis 05/13/2020   Family history of breast cancer 01/30/2020   Presence of IUD 04/25/2018    PCP:  Etta Grandchild, MD        REFERRING PROVIDER: Lorriane Shire, MD   REFERRING DIAG:  N94.6 (ICD-10-CM) - Dysmenorrhea  M79.10 (ICD-10-CM) - Myalgia    THERAPY DIAG:  Other muscle spasm  Muscle weakness (generalized)  Unspecified lack of coordination  Abnormal posture  Rationale for Evaluation and Treatment: Rehabilitation  ONSET DATE: since starting menstrual cycle  SUBJECTIVE:                                                                                                                                                                                           SUBJECTIVE STATEMENT: Pt has extreme menstrual pain and state,  Fluid intake: Yes: 3 x16 oz  H2O, 16 oz sweet tea, 1 energy  drink  PAIN:  Are you having pain? Yes NPRS scale: 7/10 (not as bad as childbirth) Pain location:  from belly button down to upper thigh  Pain type: aching and radiating, pressure Pain description: constant   Aggravating factors: just during cycle - at least 4 days Relieving factors: push through it, copper IUD helped initially but kept getting worse now on nuvaring  PRECAUTIONS: None  WEIGHT BEARING RESTRICTIONS: No  FALLS:  Has patient fallen in last 6 months? No  LIVING ENVIRONMENT: Lives with: lives with their spouse,  is homeless, and 4 boys (3 step, 1) Lives in: House/apartment   OCCUPATION: home  PLOF: Independent  PATIENT GOALS: goals get the pain down  PERTINENT HISTORY:  cholecystectomy Sexual abuse: No  BOWEL MOVEMENT: Pain with bowel movement: Yes can be Type of bowel movement:Type (Bristol Stool Scale) it was very runny now it is more so, Frequency not regular, and Strain Yes rarely Fully empty rectum: Yes: once in a while no Leakage: Yes: only if eating thing that really upset stomach with gas Pads: No Fiber supplement: No Diarrhea started septempber last year - before that they were more solid but still irregular   URINATION: Pain with urination: Yes sometimes if I hold it too long Fully empty bladder: Yes: just have to lean side to side Stream: Strong Urgency: Yes: if I wait too long Frequency: a lot when I drink anything Leakage: Coughing, Sneezing, Exercise, and trampoline Pads: No  INTERCOURSE: Pain with intercourse:  I think it would be painful, pain with internal exam Ability to have vaginal penetration:   Very low sex drive at this time  PREGNANCY: Vaginal deliveries: 1 2017  Tearing No   PROLAPSE:    OBJECTIVE:   DIAGNOSTIC FINDINGS:    PATIENT SURVEYS:    PFIQ-7   COGNITION: Overall cognitive status: Within functional limits for tasks assessed     SENSATION: Light touch:  Proprioception:   MUSCLE  LENGTH: Hamstrings: Right 70 deg; Left 70 deg Thomas test:  LUMBAR SPECIAL TESTS:  Straight leg raise test: Negative  FUNCTIONAL TESTS:  SLS normal  GAIT:  Comments: WFL   POSTURE: anterior pelvic tilt and scoliosis to the left in thoracic  PELVIC ALIGNMENT: normal  LUMBARAROM/PROM:  A/PROM A/PROM  eval  Flexion 25%  Extension   Right lateral flexion   Left lateral flexion   Right rotation   Left rotation    (Blank rows = not tested)  LOWER EXTREMITY ROM:  Passive ROM Right eval Left eval  Hip flexion 75% 75%  Hip extension    Hip abduction    Hip adduction    Hip internal rotation [facility]  WFL   Hip external rotation [facility]   Knee flexion    Knee extension    Ankle dorsiflexion    Ankle plantarflexion    Ankle inversion    Ankle eversion     (Blank rows = not tested)  LOWER EXTREMITY MMT:  MMT Right eval Left eval  Hip flexion    Hip extension    Hip abduction 5 5  Hip adduction 4/5 4/5  Hip internal rotation    Hip external rotation    Knee flexion    Knee extension    Ankle dorsiflexion    Ankle plantarflexion    Ankle inversion    Ankle eversion     PALPATION:   General  tight lumbar and thoracic spine, left ribcage flare more than right, abdomen tender along linea alba and bloated, upper traps hyperactive, hamstrings and gluteal tension bil                External Perineal Exam tension left more than right externally, normal perineal body                             Internal Pelvic Floor tight and tender left side anterior muscle tissue and posteriorly tight and tender bilaterally  Patient confirms identification and approves PT to assess internal pelvic floor  and treatment Yes  PELVIC MMT:   MMT eval  Vaginal 3/5 for one rep only, holding breath when attempt to hold endurance (6 seconds)  Internal Anal Sphincter   External Anal Sphincter   Puborectalis   Diastasis Recti   (Blank rows = not tested)         TONE: high  PROLAPSE: no  TODAY'S TREATMENT:                                                                                                                              DATE: 07/12/22  EVAL and initial HEP   PATIENT EDUCATION:  Education details: Access Code: [email or web address] Person educated: Patient Education method: Explanation, Verbal cues, and Handouts Education comprehension: verbalized understanding  HOME EXERCISE PROGRAM: Access Code: [email or web address] URL: [email or web address] Date: 07/12/2022 Prepared by: Dwana Curd  Exercises - Hooklying Hamstring Stretch with Strap  - 1 x daily - 7 x weekly - 1 sets - 3 reps - 30 sec hold - Supine Hip Internal and External Rotation  - 1 x daily - 7 x weekly - 1 sets - 10 reps - 5 sec hold  Patient Education - Trigger Point Dry Needling  ASSESSMENT:  CLINICAL IMPRESSION: Patient is a 32 y.o. female who was seen today for physical therapy evaluation and treatment for myalgia of the pelvic floor and painful menstrual cycles.  Pt has high tone and weakness of the pelvic floor. Pt also very tender to palpation throughout.  Pt has uncoordinated muscle contractions and breath holding when doing a kegel. Pt is tight in lumbar and thoracic paraspinals, hamstrings, and gluteals as well as other areas noted above.  Pt will benefit from skilled PT to address all above mentioned impairments and return to maximum function without pain.  OBJECTIVE IMPAIRMENTS: decreased coordination, decreased ROM, decreased strength, increased muscle spasms, impaired flexibility, impaired sensation, impaired tone, postural dysfunction, and pain.   ACTIVITY LIMITATIONS: standing, continence, and toileting  PARTICIPATION LIMITATIONS: interpersonal relationship and community activity  PERSONAL FACTORS: 1-2 comorbidities: vaginal delivery, cholecystectomy, GI condition that is unknown, chronic pain  are also affecting patient's functional outcome.    REHAB POTENTIAL: Good  CLINICAL DECISION MAKING: Evolving/moderate complexity  EVALUATION COMPLEXITY: Moderate   GOALS: Goals reviewed with patient? Yes  SHORT TERM GOALS: Target date: 08/09/22  Ind with initial HEP Baseline: Goal status: INITIAL    LONG TERM GOALS: Target date: 10/04/22  Pt will be independent with advanced HEP to maintain improvements made throughout therapy  Baseline:  Goal status: INITIAL  2.  Pt will report 75% reduction of pain due to improvements in posture, strength, and muscle length  Baseline:  Goal status: INITIAL  3.  Pt will be able to functional actions such as coughing or jumping without leakage or feeling as if it might happen Baseline:  Goal status: INITIAL  4.  Pt will report 50% less abdominal bloating and discomfort due to improved muscle tone throughout the core  Baseline:  Goal status: INITIAL    PLAN:  PT FREQUENCY: 1x/week  PT DURATION: 12 weeks  PLANNED INTERVENTIONS: Therapeutic exercises, Therapeutic activity, Neuromuscular re-education, Balance training, Gait training, Patient/Family education, Self Care, Joint mobilization, Aquatic Therapy, Dry Needling, Electrical stimulation, Spinal mobilization, Cryotherapy, Moist heat, scar mobilization, Taping, Traction, Biofeedback, Manual therapy, and Re-evaluation  PLAN FOR NEXT SESSION: dry needling lumbar, hamstring, gluteals and/or STM; stretches and breathing correctly, RUSI for transversus abdominus activation    Brayton Caves Raif Chachere, PT 07/12/2022, 1:42 PM

## 2022-07-12 ENCOUNTER — Telehealth: Payer: Medicaid Other | Admitting: Psychiatry

## 2022-07-12 ENCOUNTER — Ambulatory Visit: Payer: Medicaid Other | Attending: Obstetrics and Gynecology | Admitting: Physical Therapy

## 2022-07-12 ENCOUNTER — Other Ambulatory Visit: Payer: Self-pay

## 2022-07-12 ENCOUNTER — Encounter: Payer: Self-pay | Admitting: Physical Therapy

## 2022-07-12 DIAGNOSIS — M6281 Muscle weakness (generalized): Secondary | ICD-10-CM | POA: Diagnosis present

## 2022-07-12 DIAGNOSIS — M62838 Other muscle spasm: Secondary | ICD-10-CM | POA: Insufficient documentation

## 2022-07-12 DIAGNOSIS — R293 Abnormal posture: Secondary | ICD-10-CM | POA: Insufficient documentation

## 2022-07-12 DIAGNOSIS — M791 Myalgia, unspecified site: Secondary | ICD-10-CM | POA: Diagnosis not present

## 2022-07-12 DIAGNOSIS — R279 Unspecified lack of coordination: Secondary | ICD-10-CM | POA: Diagnosis present

## 2022-07-12 DIAGNOSIS — N946 Dysmenorrhea, unspecified: Secondary | ICD-10-CM | POA: Diagnosis not present

## 2022-07-13 ENCOUNTER — Telehealth (INDEPENDENT_AMBULATORY_CARE_PROVIDER_SITE_OTHER): Payer: Medicaid Other | Admitting: Psychiatry

## 2022-07-13 ENCOUNTER — Encounter: Payer: Self-pay | Admitting: Psychiatry

## 2022-07-13 DIAGNOSIS — E559 Vitamin D deficiency, unspecified: Secondary | ICD-10-CM

## 2022-07-13 DIAGNOSIS — F330 Major depressive disorder, recurrent, mild: Secondary | ICD-10-CM

## 2022-07-13 DIAGNOSIS — R5383 Other fatigue: Secondary | ICD-10-CM

## 2022-07-13 MED ORDER — LAMOTRIGINE 25 MG PO TABS: 50.0000 mg | ORAL_TABLET | Freq: Every day | ORAL | 1 refills | Status: DC
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
## 2022-07-20 ENCOUNTER — Ambulatory Visit: Payer: Medicaid Other | Admitting: Physical Therapy

## 2022-07-20 DIAGNOSIS — R279 Unspecified lack of coordination: Secondary | ICD-10-CM

## 2022-07-20 DIAGNOSIS — M62838 Other muscle spasm: Secondary | ICD-10-CM | POA: Diagnosis not present

## 2022-07-20 DIAGNOSIS — R293 Abnormal posture: Secondary | ICD-10-CM

## 2022-07-20 DIAGNOSIS — M6281 Muscle weakness (generalized): Secondary | ICD-10-CM

## 2022-07-20 NOTE — Therapy (Signed)
OUTPATIENT PHYSICAL THERAPY FEMALE PELVIC EVALUATION   Patient Name: Catherine Villarreal MRN: [id number] DOB:31-Jul-1990, 32 y.o., female Today's Date: 07/20/2022  END OF SESSION:  PT End of Session - 07/20/22 1004     Visit Number 2    Date for PT Re-Evaluation 10/04/22    Authorization Type Medicaid prepaid [employer] - no auth required    PT Start Time 0935    PT Stop Time 1013    PT Time Calculation (min) 38 min    Behavior During Therapy WFL for tasks assessed/performed              Past Medical History:  Diagnosis Date   Anxiety and depression    Chronic headaches    Gallstones    Kidney stones    Migraine    Past Surgical History:  Procedure Laterality Date   CHOLECYSTECTOMY  2018   Patient Active Problem List   Diagnosis Date Noted   Cyst of pineal gland 04/04/2022   Elevated LFTs 03/14/2022   Intractable chronic paroxysmal hemicrania 03/14/2022   High triglycerides 03/14/2022   Gastroesophageal reflux disease with esophagitis without hemorrhage 12/28/2021   Pure hypertriglyceridemia 03/01/2021   Chronic hyperglycemia 03/01/2021   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) 01/10/2021   Recurrent nephrolithiasis 05/13/2020   Family history of breast cancer 01/30/2020   Presence of IUD 04/25/2018    PCP:  Etta Grandchild, MD        REFERRING PROVIDER: Lorriane Shire, MD   REFERRING DIAG:  N94.6 (ICD-10-CM) - Dysmenorrhea  M79.10 (ICD-10-CM) - Myalgia    THERAPY DIAG:  Other muscle spasm  Muscle weakness (generalized)  Unspecified lack of coordination  Abnormal posture  Rationale for Evaluation and Treatment: Rehabilitation  ONSET DATE: since starting menstrual cycle  SUBJECTIVE:                                                                                                                                                                                           SUBJECTIVE STATEMENT: Pt feeling stiff today Fluid intake: Yes: 3 x16  oz  H2O, 16 oz sweet tea, 1 energy drink  PAIN:  Are you having pain? Yes NPRS scale: 7/10 (not as bad as childbirth) Pain location:  from belly button down to upper thigh  Pain type: aching and radiating, pressure Pain description: constant   Aggravating factors: just during cycle - at least 4 days Relieving factors: push through it, copper IUD helped initially but kept getting worse now on nuvaring  PRECAUTIONS: None  WEIGHT BEARING RESTRICTIONS: No  FALLS:  Has patient fallen in last 6 months? No  LIVING  ENVIRONMENT: Lives with: lives with their spouse, is homeless, and 4 boys (3 step, 1) Lives in: House/apartment   OCCUPATION: home  PLOF: Independent  PATIENT GOALS: goals get the pain down  PERTINENT HISTORY:  cholecystectomy Sexual abuse: No  BOWEL MOVEMENT: Pain with bowel movement: Yes can be Type of bowel movement:Type (Bristol Stool Scale) it was very runny now it is more so, Frequency not regular, and Strain Yes rarely Fully empty rectum: Yes: once in a while no Leakage: Yes: only if eating thing that really upset stomach with gas Pads: No Fiber supplement: No Diarrhea started septempber last year - before that they were more solid but still irregular   URINATION: Pain with urination: Yes sometimes if I hold it too long Fully empty bladder: Yes: just have to lean side to side Stream: Strong Urgency: Yes: if I wait too long Frequency: a lot when I drink anything Leakage: Coughing, Sneezing, Exercise, and trampoline Pads: No  INTERCOURSE: Pain with intercourse:  I think it would be painful, pain with internal exam Ability to have vaginal penetration:   Very low sex drive at this time  PREGNANCY: Vaginal deliveries: 1 2017  Tearing No   PROLAPSE:    OBJECTIVE:   DIAGNOSTIC FINDINGS:    PATIENT SURVEYS:    PFIQ-7   COGNITION: Overall cognitive status: Within functional limits for tasks assessed     SENSATION: Light touch:   Proprioception:   MUSCLE LENGTH: Hamstrings: Right 70 deg; Left 70 deg Thomas test:  LUMBAR SPECIAL TESTS:  Straight leg raise test: Negative  FUNCTIONAL TESTS:  SLS normal  GAIT:  Comments: WFL   POSTURE: anterior pelvic tilt and scoliosis to the left in thoracic  PELVIC ALIGNMENT: normal  LUMBARAROM/PROM:  A/PROM A/PROM  eval  Flexion 25%  Extension   Right lateral flexion   Left lateral flexion   Right rotation   Left rotation    (Blank rows = not tested)  LOWER EXTREMITY ROM:  Passive ROM Right eval Left eval  Hip flexion 75% 75%  Hip extension    Hip abduction    Hip adduction    Hip internal rotation [facility]  WFL   Hip external rotation [facility]   Knee flexion    Knee extension    Ankle dorsiflexion    Ankle plantarflexion    Ankle inversion    Ankle eversion     (Blank rows = not tested)  LOWER EXTREMITY MMT:  MMT Right eval Left eval  Hip flexion    Hip extension    Hip abduction 5 5  Hip adduction 4/5 4/5  Hip internal rotation    Hip external rotation    Knee flexion    Knee extension    Ankle dorsiflexion    Ankle plantarflexion    Ankle inversion    Ankle eversion     PALPATION:   General  tight lumbar and thoracic spine, left ribcage flare more than right, abdomen tender along linea alba and bloated, upper traps hyperactive, hamstrings and gluteal tension bil                External Perineal Exam tension left more than right externally, normal perineal body                             Internal Pelvic Floor tight and tender left side anterior muscle tissue and posteriorly tight and tender bilaterally  Patient confirms identification and  approves PT to assess internal pelvic floor and treatment Yes  PELVIC MMT:   MMT eval  Vaginal 3/5 for one rep only, holding breath when attempt to hold endurance (6 seconds)  Internal Anal Sphincter   External Anal Sphincter   Puborectalis   Diastasis Recti   (Blank rows = not tested)         TONE: high  PROLAPSE: no  TODAY'S TREATMENT:                                                                                                                              DATE: 07/20/22  Manual: Hamstirngs, gluteals, lumbar and thoracic paraspinals Trigger Point Dry-Needling  Treatment instructions: Expect mild to moderate muscle soreness. S/S of pneumothorax if dry needled over a lung field, and to seek immediate medical attention should they occur. Patient verbalized understanding of these instructions and education.  Patient Consent Given: Yes Education handout provided: Previously provided Muscles treated: Hamstirngs, gluteals, lumbar and thoracic paraspinals Electrical stimulation performed: No Parameters: N/A Treatment response/outcome: increased soft tissue legnth    Exercise: Educated and performed stretches - qped circles and cat cow to child pose H/s stretch Happy baby   PATIENT EDUCATION:  Education details: Access Code: [email or web address] Person educated: Patient Education method: Explanation, Verbal cues, and Handouts Education comprehension: verbalized understanding  HOME EXERCISE PROGRAM: Access Code: [email or web address] URL: [email or web address] Date: 07/20/2022 Prepared by: Dwana Curd  Exercises - Hooklying Hamstring Stretch with Strap  - 1 x daily - 7 x weekly - 1 sets - 3 reps - 30 sec hold - Supine Hip Internal and External Rotation  - 1 x daily - 7 x weekly - 1 sets - 10 reps - 5 sec hold - Cat Cow to Child's Pose  - 1 x daily - 7 x weekly - 1 sets - 5 reps - 10 sec hold - Happy Baby with Pelvic Floor Lengthening  - 1 x daily - 7 x weekly - 1 sets - 3 reps - 30 hold  Patient Education - Trigger Point Dry Needling  ASSESSMENT:  CLINICAL IMPRESSION: Today's session focused on pain management and improved soft tissue mobility. Pt tolerated well and has been feeling good when doing stretches from initial HEP.  Pt will benefit from skilled PT  to address all above mentioned impairments and return to maximum function without pain.  OBJECTIVE IMPAIRMENTS: decreased coordination, decreased ROM, decreased strength, increased muscle spasms, impaired flexibility, impaired sensation, impaired tone, postural dysfunction, and pain.   ACTIVITY LIMITATIONS: standing, continence, and toileting  PARTICIPATION LIMITATIONS: interpersonal relationship and community activity  PERSONAL FACTORS: 1-2 comorbidities: vaginal delivery, cholecystectomy, GI condition that is unknown, chronic pain  are also affecting patient's functional outcome.   REHAB POTENTIAL: Good  CLINICAL DECISION MAKING: Evolving/moderate complexity  EVALUATION COMPLEXITY: Moderate   GOALS: Goals reviewed with patient? Yes  SHORT TERM GOALS: Target date: 08/09/22  Ind with initial HEP Baseline: Goal status: MET  LONG TERM GOALS: Target date: 10/04/22  Pt will be independent with advanced HEP to maintain improvements made throughout therapy  Baseline:  Goal status: INITIAL  2.  Pt will report 75% reduction of pain due to improvements in posture, strength, and muscle length  Baseline:  Goal status: INITIAL  3.  Pt will be able to functional actions such as coughing or jumping without leakage or feeling as if it might happen Baseline:  Goal status: INITIAL  4.  Pt will report 50% less abdominal bloating and discomfort due to improved muscle tone throughout the core  Baseline:  Goal status: INITIAL    PLAN:  PT FREQUENCY: 1x/week  PT DURATION: 12 weeks  PLANNED INTERVENTIONS: Therapeutic exercises, Therapeutic activity, Neuromuscular re-education, Balance training, Gait training, Patient/Family education, Self Care, Joint mobilization, Aquatic Therapy, Dry Needling, Electrical stimulation, Spinal mobilization, Cryotherapy, Moist heat, scar mobilization, Taping, Traction, Biofeedback, Manual therapy, and Re-evaluation  PLAN FOR NEXT SESSION: f/u on dry  needling lumbar, hamstring, gluteals and/or STM; stretches and breathing correctly, RUSI for transversus abdominus activation , f/u on goals nustep warm up   [employer], PT 07/20/2022, 10:05 AM

## 2022-07-21 ENCOUNTER — Telehealth: Payer: Medicaid Other | Admitting: Psychiatry

## 2022-08-04 DIAGNOSIS — E559 Vitamin D deficiency, unspecified: Secondary | ICD-10-CM | POA: Diagnosis not present

## 2022-08-04 DIAGNOSIS — R5383 Other fatigue: Secondary | ICD-10-CM | POA: Diagnosis not present

## 2022-08-04 DIAGNOSIS — F330 Major depressive disorder, recurrent, mild: Secondary | ICD-10-CM | POA: Diagnosis not present

## 2022-08-05 ENCOUNTER — Encounter (HOSPITAL_COMMUNITY): Payer: Self-pay | Admitting: Psychiatry

## 2022-08-05 LAB — VITAMIN D 25 HYDROXY (VIT D DEFICIENCY, FRACTURES): Vit D, 25-Hydroxy: 48.8 ng/mL (ref 30.0–100.0)

## 2022-08-05 LAB — T4, FREE: Free T4: 1.27 ng/dL (ref 0.82–1.77)

## 2022-08-05 LAB — TSH: TSH: 1.1 u[IU]/mL (ref 0.450–4.500)
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
## 2022-08-10 ENCOUNTER — Other Ambulatory Visit: Payer: Self-pay | Admitting: Internal Medicine

## 2022-08-10 DIAGNOSIS — K2100 Gastro-esophageal reflux disease with esophagitis, without bleeding: Secondary | ICD-10-CM

## 2022-08-15 ENCOUNTER — Ambulatory Visit: Payer: Medicaid Other | Admitting: Obstetrics and Gynecology

## 2022-08-16 ENCOUNTER — Encounter: Payer: Medicaid Other | Admitting: Physical Therapy

## 2022-08-16 ENCOUNTER — Encounter: Payer: Self-pay | Admitting: Physical Therapy

## 2022-08-16 ENCOUNTER — Encounter: Payer: Medicaid Other | Attending: Psychology | Admitting: Psychology

## 2022-08-16 ENCOUNTER — Ambulatory Visit: Payer: Medicaid Other | Attending: Obstetrics and Gynecology | Admitting: Physical Therapy

## 2022-08-16 DIAGNOSIS — F330 Major depressive disorder, recurrent, mild: Secondary | ICD-10-CM | POA: Diagnosis not present

## 2022-08-16 DIAGNOSIS — R279 Unspecified lack of coordination: Secondary | ICD-10-CM | POA: Insufficient documentation

## 2022-08-16 DIAGNOSIS — R41840 Attention and concentration deficit: Secondary | ICD-10-CM | POA: Diagnosis not present

## 2022-08-16 DIAGNOSIS — M6281 Muscle weakness (generalized): Secondary | ICD-10-CM | POA: Diagnosis present

## 2022-08-16 DIAGNOSIS — R293 Abnormal posture: Secondary | ICD-10-CM | POA: Insufficient documentation

## 2022-08-16 DIAGNOSIS — M62838 Other muscle spasm: Secondary | ICD-10-CM | POA: Diagnosis present

## 2022-08-16 NOTE — Therapy (Signed)
OUTPATIENT PHYSICAL THERAPY FEMALE PELVIC Treatment   Patient Name: Catherine Villarreal MRN: [id number] DOB:1990/10/02, 32 y.o., female Today's Date: 08/16/2022  END OF SESSION:  PT End of Session - 08/16/22 1157     Visit Number 3    Date for PT Re-Evaluation 10/04/22    Authorization Type Medicaid prepaid [employer] - no auth required    PT Start Time 1150    PT Stop Time 1230    PT Time Calculation (min) 40 min    Activity Tolerance Patient tolerated treatment well    Behavior During Therapy WFL for tasks assessed/performed               Past Medical History:  Diagnosis Date   Anxiety and depression    Chronic headaches    Gallstones    Kidney stones    Migraine    Past Surgical History:  Procedure Laterality Date   CHOLECYSTECTOMY  2018   Patient Active Problem List   Diagnosis Date Noted   Cyst of pineal gland 04/04/2022   Elevated LFTs 03/14/2022   Intractable chronic paroxysmal hemicrania 03/14/2022   High triglycerides 03/14/2022   Gastroesophageal reflux disease with esophagitis without hemorrhage 12/28/2021   Pure hypertriglyceridemia 03/01/2021   Chronic hyperglycemia 03/01/2021   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) 01/10/2021   Recurrent nephrolithiasis 05/13/2020   Family history of breast cancer 01/30/2020   Presence of IUD 04/25/2018    PCP:  Etta Grandchild, MD        REFERRING PROVIDER: Lorriane Shire, MD   REFERRING DIAG:  N94.6 (ICD-10-CM) - Dysmenorrhea  M79.10 (ICD-10-CM) - Myalgia    THERAPY DIAG:  Other muscle spasm  Muscle weakness (generalized)  Unspecified lack of coordination  Abnormal posture  Rationale for Evaluation and Treatment: Rehabilitation  ONSET DATE: since starting menstrual cycle  SUBJECTIVE:                                                                                                                                                                                           SUBJECTIVE  STATEMENT: Pt had a salad 1.5 hours ago today and is noticeably bloated.  Pt states up to 4 bottles per day. Fluid intake: Yes: 3 x16 oz  H2O, 16 oz sweet tea, 1 energy drink  PAIN:  Are you having pain? Yes NPRS scale: 3/10 Pain location:  low back  Pain type: aching and radiating, pressure Pain description: constant   Aggravating factors: just during cycle - at least 4 days Relieving factors: push through it, copper IUD helped initially but kept getting worse now on nuvaring  PRECAUTIONS: None  WEIGHT BEARING RESTRICTIONS: No  FALLS:  Has patient fallen in last 6 months? No  LIVING ENVIRONMENT: Lives with: lives with their spouse, is homeless, and 4 boys (3 step, 1) Lives in: House/apartment   OCCUPATION: home  PLOF: Independent  PATIENT GOALS: goals get the pain down  PERTINENT HISTORY:  cholecystectomy Sexual abuse: No  BOWEL MOVEMENT: Pain with bowel movement: Yes can be Type of bowel movement:Type (Bristol Stool Scale) it was very runny now it is more so, Frequency not regular, and Strain Yes rarely Fully empty rectum: Yes: once in a while no Leakage: Yes: only if eating thing that really upset stomach with gas Pads: No Fiber supplement: No Diarrhea started septempber last year - before that they were more solid but still irregular   URINATION: Pain with urination: Yes sometimes if I hold it too long Fully empty bladder: Yes: just have to lean side to side Stream: Strong Urgency: Yes: if I wait too long Frequency: a lot when I drink anything Leakage: Coughing, Sneezing, Exercise, and trampoline Pads: No  INTERCOURSE: Pain with intercourse:  I think it would be painful, pain with internal exam Ability to have vaginal penetration:   Very low sex drive at this time  PREGNANCY: Vaginal deliveries: 1 2017  Tearing No   PROLAPSE:    OBJECTIVE:   DIAGNOSTIC FINDINGS:    PATIENT SURVEYS:    PFIQ-7   COGNITION: Overall cognitive status:  Within functional limits for tasks assessed     SENSATION: Light touch:  Proprioception:   MUSCLE LENGTH: Hamstrings: Right 70 deg; Left 70 deg Thomas test:  LUMBAR SPECIAL TESTS:  Straight leg raise test: Negative  FUNCTIONAL TESTS:  SLS normal  GAIT:  Comments: WFL   POSTURE: anterior pelvic tilt and scoliosis to the left in thoracic  PELVIC ALIGNMENT: normal  LUMBARAROM/PROM:  A/PROM A/PROM  eval  Flexion 25%  Extension   Right lateral flexion   Left lateral flexion   Right rotation   Left rotation    (Blank rows = not tested)  LOWER EXTREMITY ROM:  Passive ROM Right eval Left eval  Hip flexion 75% 75%  Hip extension    Hip abduction    Hip adduction    Hip internal rotation [facility]  WFL   Hip external rotation [facility]   Knee flexion    Knee extension    Ankle dorsiflexion    Ankle plantarflexion    Ankle inversion    Ankle eversion     (Blank rows = not tested)  LOWER EXTREMITY MMT:  MMT Right eval Left eval  Hip flexion    Hip extension    Hip abduction 5 5  Hip adduction 4/5 4/5  Hip internal rotation    Hip external rotation    Knee flexion    Knee extension    Ankle dorsiflexion    Ankle plantarflexion    Ankle inversion    Ankle eversion     PALPATION:   General  tight lumbar and thoracic spine, left ribcage flare more than right, abdomen tender along linea alba and bloated, upper traps hyperactive, hamstrings and gluteal tension bil                External Perineal Exam tension left more than right externally, normal perineal body                             Internal Pelvic Floor tight and  tender left side anterior muscle tissue and posteriorly tight and tender bilaterally  Patient confirms identification and approves PT to assess internal pelvic floor and treatment Yes  PELVIC MMT:   MMT eval  Vaginal 3/5 for one rep only, holding breath when attempt to hold endurance (6 seconds)  Internal Anal Sphincter   External Anal  Sphincter   Puborectalis   Diastasis Recti   (Blank rows = not tested)        TONE: high  PROLAPSE: no  TODAY'S TREATMENT:                                                                                                                              DATE: 08/16/22  Manual: Abdominal fascial release and lymph massage Gluteals, lumbar and thoracic paraspinals Trigger Point Dry-Needling  Treatment instructions: Expect mild to moderate muscle soreness. S/S of pneumothorax if dry needled over a lung field, and to seek immediate medical attention should they occur. Patient verbalized understanding of these instructions and education.  Patient Consent Given: Yes Education handout provided: Previously provided Muscles treated: bil gluteals, lumbar and thoracic paraspinals Electrical stimulation performed: No Parameters: N/A Treatment response/outcome: increased soft tissue legnth     07/20/22  Manual: Hamstirngs, gluteals, lumbar and thoracic paraspinals Trigger Point Dry-Needling  Treatment instructions: Expect mild to moderate muscle soreness. S/S of pneumothorax if dry needled over a lung field, and to seek immediate medical attention should they occur. Patient verbalized understanding of these instructions and education.  Patient Consent Given: Yes Education handout provided: Previously provided Muscles treated: Hamstirngs, gluteals, lumbar and thoracic paraspinals Electrical stimulation performed: No Parameters: N/A Treatment response/outcome: increased soft tissue legnth    Exercise: Educated and performed stretches - qped circles and cat cow to child pose H/s stretch Happy baby   PATIENT EDUCATION:  Education details: Access Code: [email or web address] Person educated: Patient Education method: Explanation, Verbal cues, and Handouts Education comprehension: verbalized understanding  HOME EXERCISE PROGRAM: Access Code: [email or web address] URL: [email or web address] Date:  07/20/2022 Prepared by: Dwana Curd  Exercises - Hooklying Hamstring Stretch with Strap  - 1 x daily - 7 x weekly - 1 sets - 3 reps - 30 sec hold - Supine Hip Internal and External Rotation  - 1 x daily - 7 x weekly - 1 sets - 10 reps - 5 sec hold - Cat Cow to Child's Pose  - 1 x daily - 7 x weekly - 1 sets - 5 reps - 10 sec hold - Happy Baby with Pelvic Floor Lengthening  - 1 x daily - 7 x weekly - 1 sets - 3 reps - 30 hold  Patient Education - Trigger Point Dry Needling  ASSESSMENT:  CLINICAL IMPRESSION: Today's session focused on ways to reduce abdominal bloating with manaul techniques.  Pt responded well to dry needling last session and did again as she had tightened up a lot after her cycle.  Pt needs more soft tissue work at this  time to work on improved muscle length and pain management.  Pt will benefit from skilled PT to address all above mentioned impairments and return to maximum function without pain.  OBJECTIVE IMPAIRMENTS: decreased coordination, decreased ROM, decreased strength, increased muscle spasms, impaired flexibility, impaired sensation, impaired tone, postural dysfunction, and pain.   ACTIVITY LIMITATIONS: standing, continence, and toileting  PARTICIPATION LIMITATIONS: interpersonal relationship and community activity  PERSONAL FACTORS: 1-2 comorbidities: vaginal delivery, cholecystectomy, GI condition that is unknown, chronic pain  are also affecting patient's functional outcome.   REHAB POTENTIAL: Good  CLINICAL DECISION MAKING: Evolving/moderate complexity  EVALUATION COMPLEXITY: Moderate   GOALS: Goals reviewed with patient? Yes  SHORT TERM GOALS: Target date: 08/09/22  Ind with initial HEP Baseline: Goal status: MET    LONG TERM GOALS: Target date: 10/04/22  Pt will be independent with advanced HEP to maintain improvements made throughout therapy  Baseline:  Goal status: IN PROGRESS  2.  Pt will report 75% reduction of pain due to  improvements in posture, strength, and muscle length  Baseline:  Goal status: IN PROGRESS  3.  Pt will be able to functional actions such as coughing or jumping without leakage or feeling as if it might happen Baseline:  Goal status: IN PROGRESS  4.  Pt will report 50% less abdominal bloating and discomfort due to improved muscle tone throughout the core  Baseline:  Goal status: IN PROGRESS    PLAN:  PT FREQUENCY: 1x/week  PT DURATION: 12 weeks  PLANNED INTERVENTIONS: Therapeutic exercises, Therapeutic activity, Neuromuscular re-education, Balance training, Gait training, Patient/Family education, Self Care, Joint mobilization, Aquatic Therapy, Dry Needling, Electrical stimulation, Spinal mobilization, Cryotherapy, Moist heat, scar mobilization, Taping, Traction, Biofeedback, Manual therapy, and Re-evaluation  PLAN FOR NEXT SESSION: f/u on dry needling hamstring, gluteals and/or STM; stretches and breathing correctly, RUSI for transversus abdominus activation with breathing and diaphragm movement, f/u on bloating and lying down after eating pressing below xyphoid process   Brayton Caves Renesme Kerrigan, PT 08/16/2022, 11:57 AM

## 2022-08-17 ENCOUNTER — Encounter: Payer: Self-pay | Admitting: Psychiatry

## 2022-08-17 ENCOUNTER — Telehealth (INDEPENDENT_AMBULATORY_CARE_PROVIDER_SITE_OTHER): Payer: Medicaid Other | Admitting: Psychiatry

## 2022-08-17 DIAGNOSIS — F330 Major depressive disorder, recurrent, mild: Secondary | ICD-10-CM

## 2022-08-17 MED ORDER — LAMOTRIGINE 25 MG PO TABS: 50.0000 mg | ORAL_TABLET | Freq: Every day | ORAL | 1 refills | Status: DC

## 2022-08-17 NOTE — Patient Instructions (Signed)
Continue lamotrigine 50 mg daily  Next appointment- 8/23 at 10 30

## 2022-08-22 ENCOUNTER — Encounter: Payer: Medicaid Other | Attending: Psychology

## 2022-08-22 DIAGNOSIS — F330 Major depressive disorder, recurrent, mild: Secondary | ICD-10-CM | POA: Diagnosis not present

## 2022-08-22 DIAGNOSIS — R41840 Attention and concentration deficit: Secondary | ICD-10-CM

## 2022-08-22 NOTE — Progress Notes (Signed)
   Behavioral Observations: The patient appeared well-groomed and appropriately dressed. Her manners were polite and appropriate to the situation. The patient's attitude towards testing was positive and her effort was good.   Neuropsychology Note  Catherine Villarreal completed 75 minutes of neuropsychological testing with technician, Staci Acosta, BA, under the supervision of Arley Phenix, PsyD., Clinical Neuropsychologist. The patient did not appear overtly distressed by the testing session, per behavioral observation or via self-report to the technician. Rest breaks were offered.   Clinical Decision Making: In considering the patient's current level of functioning, level of presumed impairment, nature of symptoms, emotional and behavioral responses during clinical interview, level of literacy, and observed level of motivation/effort, a battery of tests was selected by Dr. Kieth Brightly during initial consultation on 08/16/2022. This was communicated to the technician. Communication between the neuropsychologist and technician was ongoing throughout the testing session and changes were made as deemed necessary based on patient performance on testing, technician observations and additional pertinent factors such as those listed above.  Tests Administered: Comprehensive Attention Battery (CAB) Continuous Performance Test (CPT)  Results: Will be included in final report   Feedback to Patient: Catherine Villarreal will return on 07/02/2023 for an interactive feedback session with Dr. Kieth Brightly at which time her test performances, clinical impressions and treatment recommendations will be reviewed in detail. The patient understands she can contact our office should she require our assistance before this time.  75 minutes spent face-to-face with patient administering standardized tests, 30 minutes spent scoring Radiographer, therapeutic). [CPT [id number], [id number]]  Full report to follow.

## 2022-08-24 ENCOUNTER — Encounter (HOSPITAL_COMMUNITY): Payer: Self-pay

## 2022-08-24 ENCOUNTER — Other Ambulatory Visit: Payer: Self-pay

## 2022-08-24 ENCOUNTER — Emergency Department (HOSPITAL_COMMUNITY): Payer: Medicaid Other

## 2022-08-24 ENCOUNTER — Emergency Department (HOSPITAL_COMMUNITY)
Admission: EM | Admit: 2022-08-24 | Discharge: 2022-08-24 | Disposition: A | Discharge status: Home / Self Care | Payer: Medicaid Other | Attending: Emergency Medicine | Admitting: Emergency Medicine

## 2022-08-24 DIAGNOSIS — S60031A Contusion of right middle finger without damage to nail, initial encounter: Secondary | ICD-10-CM | POA: Diagnosis not present

## 2022-08-24 DIAGNOSIS — S6991XA Unspecified injury of right wrist, hand and finger(s), initial encounter: Secondary | ICD-10-CM | POA: Diagnosis present

## 2022-08-24 DIAGNOSIS — S62662A Nondisplaced fracture of distal phalanx of right middle finger, initial encounter for closed fracture: Secondary | ICD-10-CM | POA: Diagnosis not present

## 2022-08-24 DIAGNOSIS — W231XXA Caught, crushed, jammed, or pinched between stationary objects, initial encounter: Secondary | ICD-10-CM | POA: Insufficient documentation

## 2022-08-24 DIAGNOSIS — S62632A Displaced fracture of distal phalanx of right middle finger, initial encounter for closed fracture: Secondary | ICD-10-CM | POA: Diagnosis not present

## 2022-08-24 DIAGNOSIS — S62639A Displaced fracture of distal phalanx of unspecified finger, initial encounter for closed fracture: Secondary | ICD-10-CM

## 2022-08-24 DIAGNOSIS — S62602A Fracture of unspecified phalanx of right middle finger, initial encounter for closed fracture: Secondary | ICD-10-CM | POA: Diagnosis not present

## 2022-08-24 DIAGNOSIS — S6000XA Contusion of unspecified finger without damage to nail, initial encounter: Secondary | ICD-10-CM

## 2022-08-24 MED ORDER — IBUPROFEN 800 MG PO TABS
800.0000 mg | ORAL_TABLET | Freq: Once | ORAL | Status: AC
  Administered 2022-08-24: 800 mg via ORAL
  Filled 2022-08-24: qty 1

## 2022-08-24 MED ORDER — ACETAMINOPHEN 500 MG PO TABS
1000.0000 mg | ORAL_TABLET | Freq: Once | ORAL | Status: AC
  Administered 2022-08-24: 1000 mg via ORAL
  Filled 2022-08-24: qty 2

## 2022-08-24 NOTE — ED Provider Notes (Signed)
[place] EMERGENCY DEPARTMENT AT [facility] Provider Note   CSN: [id number] Arrival date & time: 08/24/22  2131     History  Chief Complaint  Patient presents with   Finger Injury    Catherine Villarreal is a 32 y.o. female.  Presents with right hand injury.  Patient reports that she accidentally closed the tips of her second, third, fourth fingers in her car door just prior to coming to the ED.  She is experiencing pain in all, mostly in the third finger.  She also reports some bruising to the tip of the third finger.       Home Medications Prior to Admission medications   Medication Sig Start Date End Date Taking? Authorizing Provider  esomeprazole (NEXIUM) 40 MG capsule TAKE 1 CAPSULE(40 MG) BY MOUTH DAILY 08/10/22   Etta Grandchild, MD  etonogestrel-ethinyl estradiol (NUVARING) 0.12-0.015 MG/24HR vaginal ring Insert vaginally and leave in place for 4 consecutive weeks. Replace every 4 weeks. 07/03/22   Lorriane Shire, MD  lamoTRIgine (LAMICTAL) 25 MG tablet Take 2 tablets (50 mg total) by mouth daily. 09/11/22 11/10/22  [name], MD      Allergies    Topamax [topiramate], Dilaudid [hydromorphone hcl], and Adhesive [tape]    Review of Systems   Review of Systems  Physical Exam Updated Vital Signs BP (!) 121/94 (BP Location: Left Arm)   Pulse 64   Temp 98.6 F (37 C) (Oral)   Resp 18   Ht 5\' 7"  (1.702 m)   Wt 77.1 kg   LMP 08/03/2022   SpO2 100%   BMI 26.63 kg/m  Physical Exam Constitutional:      Appearance: Normal appearance.  HENT:     Head: Atraumatic.  Musculoskeletal:     Right hand: Tenderness (Distal portions of second, third, fourth finger) present. Normal capillary refill.     Comments: Pinpoint subungual hematomas R second, third, fourth fingernails  Swelling and bruising pad of right middle finger  No nailbed injury  Neurological:     Mental Status: She is alert.     ED Results / Procedures / Treatments   Labs (all labs  ordered are listed, but only abnormal results are displayed) Labs Reviewed - No data to display  EKG None  Radiology DG Finger Middle Right  Result Date: 08/24/2022 CLINICAL DATA:  Mesh 3 fingers in car door. EXAM: RIGHT MIDDLE FINGER 2+V; RIGHT RING FINGER 2+V; RIGHT INDEX FINGER 2+V COMPARISON:  None Available. FINDINGS: There is a nondisplaced fracture of the tuft of the distal phalanx of the third digit. The remaining bony structures are intact. The soft tissues are within normal limits. There is no evidence of arthropathy or other focal bone abnormality. IMPRESSION: Nondisplaced fracture of the tuft of the distal phalanx of the third digit. Electronically Signed   By: Thornell Sartorius M.D.   On: 08/24/2022 22:13   DG Finger Ring Right  Result Date: 08/24/2022 CLINICAL DATA:  Mesh 3 fingers in car door. EXAM: RIGHT MIDDLE FINGER 2+V; RIGHT RING FINGER 2+V; RIGHT INDEX FINGER 2+V COMPARISON:  None Available. FINDINGS: There is a nondisplaced fracture of the tuft of the distal phalanx of the third digit. The remaining bony structures are intact. The soft tissues are within normal limits. There is no evidence of arthropathy or other focal bone abnormality. IMPRESSION: Nondisplaced fracture of the tuft of the distal phalanx of the third digit. Electronically Signed   By: Thornell Sartorius M.D.   On: 08/24/2022  22:13   DG Finger Index Right  Result Date: 08/24/2022 CLINICAL DATA:  Mesh 3 fingers in car door. EXAM: RIGHT MIDDLE FINGER 2+V; RIGHT RING FINGER 2+V; RIGHT INDEX FINGER 2+V COMPARISON:  None Available. FINDINGS: There is a nondisplaced fracture of the tuft of the distal phalanx of the third digit. The remaining bony structures are intact. The soft tissues are within normal limits. There is no evidence of arthropathy or other focal bone abnormality. IMPRESSION: Nondisplaced fracture of the tuft of the distal phalanx of the third digit. Electronically Signed   By: Thornell Sartorius M.D.   On: 08/24/2022  22:13    Procedures Procedures    Medications Ordered in ED Medications  ibuprofen (ADVIL) tablet 800 mg (has no administration in time range)  acetaminophen (TYLENOL) tablet 1,000 mg (has no administration in time range)    ED Course/ Medical Decision Making/ A&P                             Medical Decision Making Amount and/or Complexity of Data Reviewed Radiology: ordered and independent interpretation performed. Decision-making details documented in ED Course.  Risk OTC drugs. Prescription drug management.   Presents with injuries to the right second, third, fourth fingers.  Examination reveals minimal subungual hematoma, no drainable hematomas.  She does have bruising to the volar aspect of the third finger, tenderness of second, third, fourth fingers distally.  All radiology images were individually viewed by myself and independently interpreted. Agree with Radiologist interpretation.  There is a tuft fracture of the third finger, no specific treatment necessary.  Patient asking for a splint so her kids will not reinjure her finger.  This was provided.  Analgesia provided.  Rest and ice the area.  No specific follow-up necessary.        Final Clinical Impression(s) / ED Diagnoses Final diagnoses:  Contusion of finger without damage to nail, unspecified finger, unspecified laterality, initial encounter  Closed fracture of tuft of distal phalanx of finger    Rx / DC Orders ED Discharge Orders     None         Gilda Crease, MD 08/24/22 2350

## 2022-08-24 NOTE — Discharge Instructions (Signed)
Use the splint as needed for protection and comfort.  It is not necessary to utilize the splint when you do not feel like you need it any longer.  Rest and ice the area.

## 2022-08-24 NOTE — ED Triage Notes (Signed)
Pt states she accidentally shut her right hand in her car door about 40 minutes ago. C/O pain in her 1st 3 phalanges & inability to bend her fingers.

## 2022-08-25 ENCOUNTER — Telehealth: Payer: Self-pay | Admitting: Obstetrics and Gynecology

## 2022-08-25 NOTE — Transitions of Care (Post Inpatient/ED Visit) (Signed)
   08/25/2022  Name: Catherine Villarreal MRN: [id number] DOB: February 27, 1990  Today's TOC FU Call Status: Today's TOC FU Call Status:: Successful TOC FU Call Competed TOC FU Call Complete Date: 08/25/22  Transition Care Management Follow-up Telephone Call Date of Discharge: 08/25/22 [facility] Type of Discharge: Emergency Department Reason for ED Visit: Orthopedic Conditions (right hand injury=fracture third digit) Orthopedic/Injury Diagnosis: Fracture How have you been since you were released from the hospital?: Better Any questions or concerns?: Yes Patient Questions/Concerns:: didn't get Tylenol as ordered Patient Questions/Concerns Addressed: Other: (discussed patient experience office-given phone number)  Items Reviewed: Did you receive and understand the discharge instructions provided?: Yes Medications obtained,verified, and reconciled?: Yes (Medications Reviewed) Any new allergies since your discharge?: No Dietary orders reviewed?: NA Do you have support at home?: Yes People in Home: significant other Name of Support/Comfort Primary Source: Edwin Dada  Medications Reviewed Today: Medications Reviewed Today     Reviewed by Danie Chandler, RN (Registered Nurse) on 08/25/22 at 1628  Med List Status: <None>   Medication Order Taking? Sig Documenting Provider Last Dose Status Informant  esomeprazole (NEXIUM) 40 MG capsule [id number]  TAKE 1 CAPSULE(40 MG) BY MOUTH DAILY Etta Grandchild, MD  Active   etonogestrel-ethinyl estradiol (NUVARING) 0.12-0.015 MG/24HR vaginal ring [id number]  Insert vaginally and leave in place for 4 consecutive weeks. Replace every 4 weeks. Lorriane Shire, MD  Active   lamoTRIgine (LAMICTAL) 25 MG tablet [id number]  Take 2 tablets (50 mg total) by mouth daily. [name], MD  Active            Home Care and Equipment/Supplies: Were Home Health Services Ordered?: NA Any new equipment or medical supplies ordered?: NA (patient  given finger splint in ED)  Functional Questionnaire: Do you need assistance with bathing/showering or dressing?: No Do you need assistance with meal preparation?: No Do you need assistance with eating?: No Do you have difficulty maintaining continence: No Do you need assistance with getting out of bed/getting out of a chair/moving?: No Do you have difficulty managing or taking your medications?: No  Follow up appointments reviewed: PCP Follow-up appointment confirmed?: NA Specialist Hospital Follow-up appointment confirmed?: NA Do you need transportation to your follow-up appointment?: No Do you understand care options if your condition(s) worsen?: Yes-patient verbalized understanding  SDOH Interventions Today    Flowsheet Row Most Recent Value  SDOH Interventions   Housing Interventions Intervention Not Indicated  Utilities Interventions Intervention Not Indicated     Kathi Der RN, BSN [place]  [facility] Care Management Coordinator - Managed [place] High Risk [phone]

## 2022-09-04 ENCOUNTER — Encounter: Payer: Self-pay | Admitting: Physical Therapy

## 2022-09-04 ENCOUNTER — Ambulatory Visit: Payer: Medicaid Other | Attending: Obstetrics and Gynecology | Admitting: Physical Therapy

## 2022-09-04 DIAGNOSIS — R293 Abnormal posture: Secondary | ICD-10-CM | POA: Insufficient documentation

## 2022-09-04 DIAGNOSIS — M6281 Muscle weakness (generalized): Secondary | ICD-10-CM | POA: Diagnosis present

## 2022-09-04 DIAGNOSIS — M62838 Other muscle spasm: Secondary | ICD-10-CM | POA: Diagnosis present

## 2022-09-04 DIAGNOSIS — R279 Unspecified lack of coordination: Secondary | ICD-10-CM | POA: Diagnosis present

## 2022-09-04 NOTE — Therapy (Signed)
OUTPATIENT PHYSICAL THERAPY FEMALE PELVIC Treatment   Patient Name: Catherine Villarreal MRN: [id number] DOB:[birth year], 32 y.o., female Today's Date: 09/04/2022  END OF SESSION:  PT End of Session - 09/04/22 0937     Visit Number 4    Date for PT Re-Evaluation 10/04/22    Authorization Type Medicaid prepaid [employer] - no auth required    PT Start Time 0933    PT Stop Time 1023    PT Time Calculation (min) 50 min    Activity Tolerance Patient tolerated treatment well    Behavior During Therapy WFL for tasks assessed/performed                Past Medical History:  Diagnosis Date   Anxiety and depression    Chronic headaches    Gallstones    Kidney stones    Migraine    Past Surgical History:  Procedure Laterality Date   CHOLECYSTECTOMY  2018   Patient Active Problem List   Diagnosis Date Noted   Cyst of pineal gland 04/04/2022   Elevated LFTs 03/14/2022   Intractable chronic paroxysmal hemicrania 03/14/2022   High triglycerides 03/14/2022   Gastroesophageal reflux disease with esophagitis without hemorrhage 12/28/2021   Pure hypertriglyceridemia 03/01/2021   Chronic hyperglycemia 03/01/2021   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) 01/10/2021   Recurrent nephrolithiasis 05/13/2020   Family history of breast cancer 01/30/2020   Presence of IUD 04/25/2018    PCP:  Etta Grandchild, MD        REFERRING PROVIDER: Lorriane Shire, MD   REFERRING DIAG:  N94.6 (ICD-10-CM) - Dysmenorrhea  M79.10 (ICD-10-CM) - Myalgia    THERAPY DIAG:  Other muscle spasm  Muscle weakness (generalized)  Unspecified lack of coordination  Abnormal posture  Rationale for Evaluation and Treatment: Rehabilitation  ONSET DATE: since starting menstrual cycle  SUBJECTIVE:                                                                                                                                                                                           SUBJECTIVE  STATEMENT: Pt is feeling better but still feels like day to day it is different and still not sure exactly what is working, but feeling better. Fluid intake: Yes: 3 x16 oz  H2O, 16 oz sweet tea, 1 energy drink  PAIN:  Are you having pain? Yes NPRS scale: 3/10 Pain location:  low back  Pain type: aching and radiating, pressure Pain description: constant   Aggravating factors: just during cycle - at least 4 days Relieving factors: push through it, copper IUD helped initially but kept getting worse now on  nuvaring  PRECAUTIONS: None  WEIGHT BEARING RESTRICTIONS: No  FALLS:  Has patient fallen in last 6 months? No  LIVING ENVIRONMENT: Lives with: lives with their spouse, is homeless, and 4 boys (3 step, 1) Lives in: House/apartment   OCCUPATION: home  PLOF: Independent  PATIENT GOALS: goals get the pain down  PERTINENT HISTORY:  cholecystectomy Sexual abuse: No  BOWEL MOVEMENT: Pain with bowel movement: Yes can be Type of bowel movement:Type (Bristol Stool Scale) it was very runny now it is more so, Frequency not regular, and Strain Yes rarely Fully empty rectum: Yes: once in a while no Leakage: Yes: only if eating thing that really upset stomach with gas Pads: No Fiber supplement: No Diarrhea started septempber last year - before that they were more solid but still irregular   URINATION: Pain with urination: Yes sometimes if I hold it too long Fully empty bladder: Yes: just have to lean side to side Stream: Strong Urgency: Yes: if I wait too long Frequency: a lot when I drink anything Leakage: Coughing, Sneezing, Exercise, and trampoline Pads: No  INTERCOURSE: Pain with intercourse:  I think it would be painful, pain with internal exam Ability to have vaginal penetration:   Very low sex drive at this time  PREGNANCY: Vaginal deliveries: 1 2017  Tearing No   PROLAPSE:    OBJECTIVE:   DIAGNOSTIC FINDINGS:    PATIENT SURVEYS:    PFIQ-7    COGNITION: Overall cognitive status: Within functional limits for tasks assessed     SENSATION: Light touch:  Proprioception:   MUSCLE LENGTH: Hamstrings: Right 70 deg; Left 70 deg Thomas test:  LUMBAR SPECIAL TESTS:  Straight leg raise test: Negative  FUNCTIONAL TESTS:  SLS normal  GAIT:  Comments: WFL   POSTURE: anterior pelvic tilt and scoliosis to the left in thoracic  PELVIC ALIGNMENT: normal  LUMBARAROM/PROM:  A/PROM A/PROM  eval  Flexion 25%  Extension   Right lateral flexion   Left lateral flexion   Right rotation   Left rotation    (Blank rows = not tested)  LOWER EXTREMITY ROM:  Passive ROM Right eval Left eval  Hip flexion 75% 75%  Hip extension    Hip abduction    Hip adduction    Hip internal rotation [facility]  WFL   Hip external rotation [facility]  [facility]   Knee flexion    Knee extension    Ankle dorsiflexion    Ankle plantarflexion    Ankle inversion    Ankle eversion     (Blank rows = not tested)  LOWER EXTREMITY MMT:  MMT Right eval Left eval  Hip flexion    Hip extension    Hip abduction 5 5  Hip adduction 4/5 4/5  Hip internal rotation    Hip external rotation    Knee flexion    Knee extension    Ankle dorsiflexion    Ankle plantarflexion    Ankle inversion    Ankle eversion     PALPATION:   General  tight lumbar and thoracic spine, left ribcage flare more than right, abdomen tender along linea alba and bloated, upper traps hyperactive, hamstrings and gluteal tension bil                External Perineal Exam tension left more than right externally, normal perineal body  Internal Pelvic Floor tight and tender left side anterior muscle tissue and posteriorly tight and tender bilaterally  Patient confirms identification and approves PT to assess internal pelvic floor and treatment Yes  PELVIC MMT:   MMT eval  Vaginal 3/5 for one rep only, holding breath when attempt to hold endurance (6 seconds)   Internal Anal Sphincter   External Anal Sphincter   Puborectalis   Diastasis Recti   (Blank rows = not tested)        TONE: high  PROLAPSE: no  TODAY'S TREATMENT:                                                                                                                              DATE: 09/04/22  Manual:  Gluteals, lumbar and thoracic paraspinals, bil upper traps  Trigger Point Dry-Needling  Treatment instructions: Expect mild to moderate muscle soreness. S/S of pneumothorax if dry needled over a lung field, and to seek immediate medical attention should they occur. Patient verbalized understanding of these instructions and education.  Patient Consent Given: Yes Education handout provided: Previously provided Muscles treated: bil gluteals, lumbar and thoracic paraspinals up to T1 Electrical stimulation performed: No Parameters: N/A Treatment response/outcome: increased soft tissue legnth  Exercises: Thoracic ext on foam roll Foam roll with knee hugs double and single Lying on foam roll behind back lengthways - activating core with arms overhead   08/16/22  Manual: Abdominal fascial release and lymph massage Gluteals, lumbar and thoracic paraspinals Trigger Point Dry-Needling  Treatment instructions: Expect mild to moderate muscle soreness. S/S of pneumothorax if dry needled over a lung field, and to seek immediate medical attention should they occur. Patient verbalized understanding of these instructions and education.  Patient Consent Given: Yes Education handout provided: Previously provided Muscles treated: bil gluteals, lumbar and thoracic paraspinals Electrical stimulation performed: No Parameters: N/A Treatment response/outcome: increased soft tissue legnth     07/20/22  Manual: Hamstirngs, gluteals, lumbar and thoracic paraspinals Trigger Point Dry-Needling  Treatment instructions: Expect mild to moderate muscle soreness. S/S of pneumothorax if dry  needled over a lung field, and to seek immediate medical attention should they occur. Patient verbalized understanding of these instructions and education.  Patient Consent Given: Yes Education handout provided: Previously provided Muscles treated: Hamstirngs, gluteals, lumbar and thoracic paraspinals Electrical stimulation performed: No Parameters: N/A Treatment response/outcome: increased soft tissue legnth    Exercise: Educated and performed stretches - qped circles and cat cow to child pose H/s stretch Happy baby   PATIENT EDUCATION:  Education details: Access Code: [email or web address] Person educated: Patient Education method: Explanation, Verbal cues, and Handouts Education comprehension: verbalized understanding  HOME EXERCISE PROGRAM: Access Code: [email or web address] URL: [email or web address] Date: 07/20/2022 Prepared by: Dwana Curd  Exercises - Hooklying Hamstring Stretch with Strap  - 1 x daily - 7 x weekly - 1 sets - 3 reps - 30 sec hold - Supine Hip Internal and External Rotation  - 1 x daily -  7 x weekly - 1 sets - 10 reps - 5 sec hold - Cat Cow to Child's Pose  - 1 x daily - 7 x weekly - 1 sets - 5 reps - 10 sec hold - Happy Baby with Pelvic Floor Lengthening  - 1 x daily - 7 x weekly - 1 sets - 3 reps - 30 hold  Patient Education - Trigger Point Dry Needling  ASSESSMENT:  CLINICAL IMPRESSION: Today's session focused on activating core along with stretching for improved posture and core activation.  Pt still has tension in back and gluteals.  Doing a little better with less bloating since last session and will expect to continue to make progress with soft tissue work and core strengthening.  Pt will benefit from skilled PT to address all above mentioned impairments and return to maximum function without pain.  OBJECTIVE IMPAIRMENTS: decreased coordination, decreased ROM, decreased strength, increased muscle spasms, impaired flexibility, impaired sensation,  impaired tone, postural dysfunction, and pain.   ACTIVITY LIMITATIONS: standing, continence, and toileting  PARTICIPATION LIMITATIONS: interpersonal relationship and community activity  PERSONAL FACTORS: 1-2 comorbidities: vaginal delivery, cholecystectomy, GI condition that is unknown, chronic pain  are also affecting patient's functional outcome.   REHAB POTENTIAL: Good  CLINICAL DECISION MAKING: Evolving/moderate complexity  EVALUATION COMPLEXITY: Moderate   GOALS: Goals reviewed with patient? Yes  SHORT TERM GOALS: Target date: 08/09/22  Ind with initial HEP Baseline: Goal status: MET    LONG TERM GOALS: Target date: 10/04/22 Updated 09/04/22  Pt will be independent with advanced HEP to maintain improvements made throughout therapy  Baseline:  Goal status: IN PROGRESS  2.  Pt will report 75% reduction of pain due to improvements in posture, strength, and muscle length  Baseline:  Goal status: IN PROGRESS  3.  Pt will be able to functional actions such as coughing or jumping without leakage or feeling as if it might happen Baseline:  Goal status: IN PROGRESS  4.  Pt will report 50% less abdominal bloating and discomfort due to improved muscle tone throughout the core  Baseline: better Goal status: IN PROGRESS    PLAN:  PT FREQUENCY: 1x/week  PT DURATION: 12 weeks  PLANNED INTERVENTIONS: Therapeutic exercises, Therapeutic activity, Neuromuscular re-education, Balance training, Gait training, Patient/Family education, Self Care, Joint mobilization, Aquatic Therapy, Dry Needling, Electrical stimulation, Spinal mobilization, Cryotherapy, Moist heat, scar mobilization, Taping, Traction, Biofeedback, Manual therapy, and Re-evaluation  PLAN FOR NEXT SESSION: f/u on dry needling lumbar, thoracic, gluteals and/or STM #2; continue core progression, quadruped and supine on wedge, activate anterior pelvic floor   Brayton Caves Kenzo Ozment, PT 09/04/2022, 10:36 AM

## 2022-09-11 NOTE — Therapy (Unsigned)
OUTPATIENT PHYSICAL THERAPY FEMALE PELVIC Treatment   Patient Name: Catherine Villarreal MRN: [id number] DOB:01-08-1991, 32 y.o., female Today's Date: 09/12/2022  END OF SESSION:  PT End of Session - 09/12/22 0933     Visit Number 5    Date for PT Re-Evaluation 10/04/22    Authorization Type Medicaid prepaid UHC - no auth required    PT Start Time 0928    PT Stop Time 1011    PT Time Calculation (min) 43 min    Activity Tolerance Patient tolerated treatment well    Behavior During Therapy [facility] for tasks assessed/performed                 Past Medical History:  Diagnosis Date   Anxiety and depression    Chronic headaches    Gallstones    Kidney stones    Migraine    Past Surgical History:  Procedure Laterality Date   CHOLECYSTECTOMY  2018   Patient Active Problem List   Diagnosis Date Noted   Cyst of pineal gland 04/04/2022   Elevated LFTs 03/14/2022   Intractable chronic paroxysmal hemicrania 03/14/2022   High triglycerides 03/14/2022   Gastroesophageal reflux disease with esophagitis without hemorrhage 12/28/2021   Pure hypertriglyceridemia 03/01/2021   Chronic hyperglycemia 03/01/2021   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) 01/10/2021   Recurrent nephrolithiasis 05/13/2020   Family history of breast cancer 01/30/2020   Presence of IUD 04/25/2018    PCP:  Etta Grandchild, MD        REFERRING PROVIDER: Lorriane Shire, MD   REFERRING DIAG:  N94.6 (ICD-10-CM) - Dysmenorrhea  M79.10 (ICD-10-CM) - Myalgia    THERAPY DIAG:  Other muscle spasm  Muscle weakness (generalized)  Unspecified lack of coordination  Abnormal posture  Rationale for Evaluation and Treatment: Rehabilitation  ONSET DATE: since starting menstrual cycle  SUBJECTIVE:                                                                                                                                                                                           SUBJECTIVE  STATEMENT: Pt is feeling better in the lower abdomen but tension in upper abdomen.  Fluid intake: Yes: 3 x16 oz  H2O, 16 oz sweet tea, 1 energy drink  PAIN:  Are you having pain? Yes NPRS scale: 3/10 Pain location:  low back  Pain type: aching and radiating, pressure Pain description: constant   Aggravating factors: just during cycle - at least 4 days Relieving factors: push through it, copper IUD helped initially but kept getting worse now on nuvaring  PRECAUTIONS: None  WEIGHT BEARING RESTRICTIONS: No  FALLS:  Has patient fallen in last 6 months? No  LIVING ENVIRONMENT: Lives with: lives with their spouse, is homeless, and 4 boys (3 step, 1) Lives in: House/apartment   OCCUPATION: home  PLOF: Independent  PATIENT GOALS: goals get the pain down  PERTINENT HISTORY:  cholecystectomy Sexual abuse: No  BOWEL MOVEMENT: Pain with bowel movement: Yes can be Type of bowel movement:Type (Bristol Stool Scale) it was very runny now it is more so, Frequency not regular, and Strain Yes rarely Fully empty rectum: Yes: once in a while no Leakage: Yes: only if eating thing that really upset stomach with gas Pads: No Fiber supplement: No Diarrhea started septempber last year - before that they were more solid but still irregular   URINATION: Pain with urination: Yes sometimes if I hold it too long Fully empty bladder: Yes: just have to lean side to side Stream: Strong Urgency: Yes: if I wait too long Frequency: a lot when I drink anything Leakage: Coughing, Sneezing, Exercise, and trampoline Pads: No  INTERCOURSE: Pain with intercourse:  I think it would be painful, pain with internal exam Ability to have vaginal penetration:   Very low sex drive at this time  PREGNANCY: Vaginal deliveries: 1 2017  Tearing No   PROLAPSE:    OBJECTIVE:   DIAGNOSTIC FINDINGS:    PATIENT SURVEYS:    PFIQ-7   COGNITION: Overall cognitive status: Within functional limits  for tasks assessed     SENSATION: Light touch:  Proprioception:   MUSCLE LENGTH: Hamstrings: Right 70 deg; Left 70 deg Thomas test:  LUMBAR SPECIAL TESTS:  Straight leg raise test: Negative  FUNCTIONAL TESTS:  SLS normal  GAIT:  Comments: WFL   POSTURE: anterior pelvic tilt and scoliosis to the left in thoracic  PELVIC ALIGNMENT: normal  LUMBARAROM/PROM:  A/PROM A/PROM  eval  Flexion 25%  Extension   Right lateral flexion   Left lateral flexion   Right rotation   Left rotation    (Blank rows = not tested)  LOWER EXTREMITY ROM:  Passive ROM Right eval Left eval  Hip flexion 75% 75%  Hip extension    Hip abduction    Hip adduction    Hip internal rotation [facility]  WFL   Hip external rotation [facility]  [facility]   Knee flexion    Knee extension    Ankle dorsiflexion    Ankle plantarflexion    Ankle inversion    Ankle eversion     (Blank rows = not tested)  LOWER EXTREMITY MMT:  MMT Right eval Left eval  Hip flexion    Hip extension    Hip abduction 5 5  Hip adduction 4/5 4/5  Hip internal rotation    Hip external rotation    Knee flexion    Knee extension    Ankle dorsiflexion    Ankle plantarflexion    Ankle inversion    Ankle eversion     PALPATION:   General  tight lumbar and thoracic spine, left ribcage flare more than right, abdomen tender along linea alba and bloated, upper traps hyperactive, hamstrings and gluteal tension bil                External Perineal Exam tension left more than right externally, normal perineal body                             Internal Pelvic Floor tight and tender left side anterior muscle  tissue and posteriorly tight and tender bilaterally  Patient confirms identification and approves PT to assess internal pelvic floor and treatment Yes  PELVIC MMT:   MMT eval  Vaginal 3/5 for one rep only, holding breath when attempt to hold endurance (6 seconds)  Internal Anal Sphincter   External Anal Sphincter   Puborectalis    Diastasis Recti   (Blank rows = not tested)        TONE: high  PROLAPSE: no  TODAY'S TREATMENT:                                                                                                                              DATE: 09/12/22  Manual:  Gluteals, lumbar and thoracic paraspinals, bil upper traps, abdominal fascial release around the diaphragm  Trigger Point Dry-Needling  Treatment instructions: Expect mild to moderate muscle soreness. S/S of pneumothorax if dry needled over a lung field, and to seek immediate medical attention should they occur. Patient verbalized understanding of these instructions and education.  Patient Consent Given: Yes Education handout provided: Previously provided Muscles treated: bil  lumbar and thoracic paraspinals up to T1 Electrical stimulation performed: No Parameters: N/A Treatment response/outcome: increased soft tissue legnth Exercises: Thoracic side bend sitting leg together and hip rotation separate -   Rotation upper and low body in supine  09/04/22  Manual:  Gluteals, lumbar and thoracic paraspinals, bil upper traps  Trigger Point Dry-Needling  Treatment instructions: Expect mild to moderate muscle soreness. S/S of pneumothorax if dry needled over a lung field, and to seek immediate medical attention should they occur. Patient verbalized understanding of these instructions and education.  Patient Consent Given: Yes Education handout provided: Previously provided Muscles treated: bil gluteals, lumbar and thoracic paraspinals up to T1 Electrical stimulation performed: No Parameters: N/A Treatment response/outcome: increased soft tissue legnth  Exercises: Thoracic ext on foam roll Foam roll with knee hugs double and single Lying on foam roll behind back lengthways - activating core with arms overhead   08/16/22  Manual: Abdominal fascial release and lymph massage Gluteals, lumbar and thoracic paraspinals Trigger Point  Dry-Needling  Treatment instructions: Expect mild to moderate muscle soreness. S/S of pneumothorax if dry needled over a lung field, and to seek immediate medical attention should they occur. Patient verbalized understanding of these instructions and education.  Patient Consent Given: Yes Education handout provided: Previously provided Muscles treated: bil gluteals, lumbar and thoracic paraspinals Electrical stimulation performed: No Parameters: N/A Treatment response/outcome: increased soft tissue legnth     PATIENT EDUCATION:  Education details: Access Code: [email or web address] Person educated: Patient Education method: Explanation, Verbal cues, and Handouts Education comprehension: verbalized understanding  HOME EXERCISE PROGRAM: Access Code: [email or web address] URL: [email or web address] Date: 07/20/2022 Prepared by: Dwana Curd  Exercises - Hooklying Hamstring Stretch with Strap  - 1 x daily - 7 x weekly - 1 sets - 3 reps - 30 sec hold - Supine Hip Internal and External Rotation  - 1  x daily - 7 x weekly - 1 sets - 10 reps - 5 sec hold - Cat Cow to Child's Pose  - 1 x daily - 7 x weekly - 1 sets - 5 reps - 10 sec hold - Happy Baby with Pelvic Floor Lengthening  - 1 x daily - 7 x weekly - 1 sets - 3 reps - 30 hold  Patient Education - Trigger Point Dry Needling  ASSESSMENT:  CLINICAL IMPRESSION: Today's session focused on thoracic mobility. Pt having much less bloating today and overall only had bloating in upper abdomen. Pt will benefit from skilled PT to address all above mentioned impairments and return to maximum function without pain.  OBJECTIVE IMPAIRMENTS: decreased coordination, decreased ROM, decreased strength, increased muscle spasms, impaired flexibility, impaired sensation, impaired tone, postural dysfunction, and pain.   ACTIVITY LIMITATIONS: standing, continence, and toileting  PARTICIPATION LIMITATIONS: interpersonal relationship and community  activity  PERSONAL FACTORS: 1-2 comorbidities: vaginal delivery, cholecystectomy, GI condition that is unknown, chronic pain  are also affecting patient's functional outcome.   REHAB POTENTIAL: Good  CLINICAL DECISION MAKING: Evolving/moderate complexity  EVALUATION COMPLEXITY: Moderate   GOALS: Goals reviewed with patient? Yes  SHORT TERM GOALS: Target date: 08/09/22  Ind with initial HEP Baseline: Goal status: MET    LONG TERM GOALS: Target date: 10/04/22 Updated 09/04/22  Pt will be independent with advanced HEP to maintain improvements made throughout therapy  Baseline:  Goal status: IN PROGRESS  2.  Pt will report 75% reduction of pain due to improvements in posture, strength, and muscle length  Baseline:  Goal status: IN PROGRESS  3.  Pt will be able to functional actions such as coughing or jumping without leakage or feeling as if it might happen Baseline:  Goal status: IN PROGRESS  4.  Pt will report 50% less abdominal bloating and discomfort due to improved muscle tone throughout the core  Baseline: better Goal status: IN PROGRESS    PLAN:  PT FREQUENCY: 1x/week  PT DURATION: 12 weeks  PLANNED INTERVENTIONS: Therapeutic exercises, Therapeutic activity, Neuromuscular re-education, Balance training, Gait training, Patient/Family education, Self Care, Joint mobilization, Aquatic Therapy, Dry Needling, Electrical stimulation, Spinal mobilization, Cryotherapy, Moist heat, scar mobilization, Taping, Traction, Biofeedback, Manual therapy, and Re-evaluation  PLAN FOR NEXT SESSION: f/u on dry needling lumbar, thoracic, gluteals and/or STM #2; continue core progression, quadruped and supine on wedge, activate anterior pelvic floor   Brayton Caves Emrys M[place], PT 09/12/2022, 9:40 AM

## 2022-09-12 ENCOUNTER — Ambulatory Visit: Payer: Medicaid Other | Admitting: Physical Therapy

## 2022-09-12 DIAGNOSIS — R293 Abnormal posture: Secondary | ICD-10-CM

## 2022-09-12 DIAGNOSIS — M62838 Other muscle spasm: Secondary | ICD-10-CM | POA: Diagnosis not present

## 2022-09-12 DIAGNOSIS — M6281 Muscle weakness (generalized): Secondary | ICD-10-CM

## 2022-09-12 DIAGNOSIS — R279 Unspecified lack of coordination: Secondary | ICD-10-CM

## 2022-09-18 ENCOUNTER — Ambulatory Visit: Payer: Medicaid Other | Admitting: Physical Therapy

## 2022-09-18 ENCOUNTER — Encounter: Payer: Self-pay | Admitting: Physical Therapy

## 2022-09-18 DIAGNOSIS — M62838 Other muscle spasm: Secondary | ICD-10-CM | POA: Diagnosis not present

## 2022-09-18 DIAGNOSIS — R279 Unspecified lack of coordination: Secondary | ICD-10-CM

## 2022-09-18 DIAGNOSIS — R293 Abnormal posture: Secondary | ICD-10-CM

## 2022-09-18 DIAGNOSIS — M6281 Muscle weakness (generalized): Secondary | ICD-10-CM

## 2022-09-18 NOTE — Therapy (Signed)
OUTPATIENT PHYSICAL THERAPY FEMALE PELVIC Treatment   Patient Name: Catherine Villarreal MRN: [id number] DOB:12-Dec-1990, 32 y.o., female Today's Date: 09/18/2022  END OF SESSION:  PT End of Session - 09/18/22 0931     Visit Number 6    Date for PT Re-Evaluation 10/04/22    Authorization Type Medicaid prepaid [employer] - no auth required    PT Start Time 0931    PT Stop Time 1011    PT Time Calculation (min) 40 min    Activity Tolerance Patient tolerated treatment well    Behavior During Therapy [facility] for tasks assessed/performed                  Past Medical History:  Diagnosis Date   Anxiety and depression    Chronic headaches    Gallstones    Kidney stones    Migraine    Past Surgical History:  Procedure Laterality Date   CHOLECYSTECTOMY  2018   Patient Active Problem List   Diagnosis Date Noted   Cyst of pineal gland 04/04/2022   Elevated LFTs 03/14/2022   Intractable chronic paroxysmal hemicrania 03/14/2022   High triglycerides 03/14/2022   Gastroesophageal reflux disease with esophagitis without hemorrhage 12/28/2021   Pure hypertriglyceridemia 03/01/2021   Chronic hyperglycemia 03/01/2021   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) 01/10/2021   Recurrent nephrolithiasis 05/13/2020   Family history of breast cancer 01/30/2020   Presence of IUD 04/25/2018    PCP:  Etta Grandchild, MD        REFERRING PROVIDER: Lorriane Shire, MD   REFERRING DIAG:  N94.6 (ICD-10-CM) - Dysmenorrhea  M79.10 (ICD-10-CM) - Myalgia    THERAPY DIAG:  Abnormal posture  Other muscle spasm  Muscle weakness (generalized)  Unspecified lack of coordination  Rationale for Evaluation and Treatment: Rehabilitation  ONSET DATE: since starting menstrual cycle  SUBJECTIVE:                                                                                                                                                                                           SUBJECTIVE  STATEMENT: Pt has been about the same since last week.  Pt is feeling tired today.  Pt is eating more different things and tracking the symptoms when something feels like it causes issues.  So, diet is not as "safe food" oriented. Fluid intake: Yes: 3 x16 oz  H2O, 16 oz sweet tea, 1 energy drink  PAIN:  Are you having pain? Yes NPRS scale: 1/10 Pain location:   mid back  Pain type: aching and pressure Pain description: constant   Aggravating factors: not sure, it might  be a good sore Relieving factors:   PRECAUTIONS: None  WEIGHT BEARING RESTRICTIONS: No  FALLS:  Has patient fallen in last 6 months? No  LIVING ENVIRONMENT: Lives with: lives with their spouse, is homeless, and 4 boys (3 step, 1) Lives in: House/apartment   OCCUPATION: home  PLOF: Independent  PATIENT GOALS: goals get the pain down  PERTINENT HISTORY:  cholecystectomy Sexual abuse: No  BOWEL MOVEMENT: Pain with bowel movement: Yes can be Type of bowel movement:Type (Bristol Stool Scale) it was very runny now it is more so, Frequency not regular, and Strain Yes rarely Fully empty rectum: Yes: once in a while no Leakage: Yes: only if eating thing that really upset stomach with gas Pads: No Fiber supplement: No Diarrhea started septempber last year - before that they were more solid but still irregular   URINATION: Pain with urination: Yes sometimes if I hold it too long Fully empty bladder: Yes: just have to lean side to side Stream: Strong Urgency: Yes: if I wait too long Frequency: a lot when I drink anything Leakage: Coughing, Sneezing, Exercise, and trampoline Pads: No  INTERCOURSE: Pain with intercourse:  I think it would be painful, pain with internal exam Ability to have vaginal penetration:   Very low sex drive at this time  PREGNANCY: Vaginal deliveries: 1 2017  Tearing No   PROLAPSE:    OBJECTIVE:   DIAGNOSTIC FINDINGS:    PATIENT SURVEYS:    PFIQ-7    COGNITION: Overall cognitive status: Within functional limits for tasks assessed     SENSATION: Light touch:  Proprioception:   MUSCLE LENGTH: Hamstrings: Right 70 deg; Left 70 deg Thomas test:  LUMBAR SPECIAL TESTS:  Straight leg raise test: Negative  FUNCTIONAL TESTS:  SLS normal  GAIT:  Comments: WFL   POSTURE: anterior pelvic tilt and scoliosis to the left in thoracic  PELVIC ALIGNMENT: normal  LUMBARAROM/PROM:  A/PROM A/PROM  eval  Flexion 25%  Extension   Right lateral flexion   Left lateral flexion   Right rotation   Left rotation    (Blank rows = not tested)  LOWER EXTREMITY ROM:  Passive ROM Right eval Left eval  Hip flexion 75% 75%  Hip extension    Hip abduction    Hip adduction    Hip internal rotation [facility]  WFL   Hip external rotation [facility]   Knee flexion    Knee extension    Ankle dorsiflexion    Ankle plantarflexion    Ankle inversion    Ankle eversion     (Blank rows = not tested)  LOWER EXTREMITY MMT:  MMT Right eval Left eval  Hip flexion    Hip extension    Hip abduction 5 5  Hip adduction 4/5 4/5  Hip internal rotation    Hip external rotation    Knee flexion    Knee extension    Ankle dorsiflexion    Ankle plantarflexion    Ankle inversion    Ankle eversion     PALPATION:   General  tight lumbar and thoracic spine, left ribcage flare more than right, abdomen tender along linea alba and bloated, upper traps hyperactive, hamstrings and gluteal tension bil                External Perineal Exam tension left more than right externally, normal perineal body  Internal Pelvic Floor tight and tender left side anterior muscle tissue and posteriorly tight and tender bilaterally  Patient confirms identification and approves PT to assess internal pelvic floor and treatment Yes  PELVIC MMT:   MMT eval  Vaginal 3/5 for one rep only, holding breath when attempt to hold endurance (6 seconds)   Internal Anal Sphincter   External Anal Sphincter   Puborectalis   Diastasis Recti   (Blank rows = not tested)        TONE: high  PROLAPSE: no  TODAY'S TREATMENT:                                                                                                                              DATE: 09/18/22  Manual:  Thoracic paraspinals  Trigger Point Dry-Needling  Treatment instructions: Expect mild to moderate muscle soreness. S/S of pneumothorax if dry needled over a lung field, and to seek immediate medical attention should they occur. Patient verbalized understanding of these instructions and education.  Patient Consent Given: Yes Education handout provided: Previously provided Muscles treated: bil multifidi at T8/9 and T9/10 Electrical stimulation performed: No Parameters: N/A Treatment response/outcome: increased soft tissue legnth Exercises: Thoracic side bend sitting legs together and apart with rotation - breathing into robcage Exhale with kegel and core activation supine with pillows under head - yoga block 3 ways - 10x each way Rotation upper and low body in supine   09/12/22  Manual:  Gluteals, lumbar and thoracic paraspinals, bil upper traps, abdominal fascial release around the diaphragm  Trigger Point Dry-Needling  Treatment instructions: Expect mild to moderate muscle soreness. S/S of pneumothorax if dry needled over a lung field, and to seek immediate medical attention should they occur. Patient verbalized understanding of these instructions and education.  Patient Consent Given: Yes Education handout provided: Previously provided Muscles treated: bil  lumbar and thoracic paraspinals up to T1 Electrical stimulation performed: No Parameters: N/A Treatment response/outcome: increased soft tissue legnth NMRE: Thoracic side bend sitting leg together and hip rotation separate -   Rotation upper and low body in supine  09/04/22  Manual:  Gluteals, lumbar  and thoracic paraspinals, bil upper traps  Trigger Point Dry-Needling  Treatment instructions: Expect mild to moderate muscle soreness. S/S of pneumothorax if dry needled over a lung field, and to seek immediate medical attention should they occur. Patient verbalized understanding of these instructions and education.  Patient Consent Given: Yes Education handout provided: Previously provided Muscles treated: bil gluteals, lumbar and thoracic paraspinals up to T1 Electrical stimulation performed: No Parameters: N/A Treatment response/outcome: increased soft tissue legnth  Exercises: Thoracic ext on foam roll Foam roll with knee hugs double and single Lying on foam roll behind back lengthways - activating core with arms overhead   08/16/22  Manual: Abdominal fascial release and lymph massage Gluteals, lumbar and thoracic paraspinals Trigger Point Dry-Needling  Treatment instructions: Expect mild to moderate muscle soreness. S/S of pneumothorax if dry  needled over a lung field, and to seek immediate medical attention should they occur. Patient verbalized understanding of these instructions and education.  Patient Consent Given: Yes Education handout provided: Previously provided Muscles treated: bil gluteals, lumbar and thoracic paraspinals Electrical stimulation performed: No Parameters: N/A Treatment response/outcome: increased soft tissue legnth     PATIENT EDUCATION:  Education details: Access Code: [email or web address] Person educated: Patient Education method: Explanation, Verbal cues, and Handouts Education comprehension: verbalized understanding  HOME EXERCISE PROGRAM: Access Code: [email or web address] URL: [email or web address] Date: 07/20/2022 Prepared by: Dwana Curd  Exercises - Hooklying Hamstring Stretch with Strap  - 1 x daily - 7 x weekly - 1 sets - 3 reps - 30 sec hold - Supine Hip Internal and External Rotation  - 1 x daily - 7 x weekly - 1 sets - 10 reps  - 5 sec hold - Cat Cow to Child's Pose  - 1 x daily - 7 x weekly - 1 sets - 5 reps - 10 sec hold - Happy Baby with Pelvic Floor Lengthening  - 1 x daily - 7 x weekly - 1 sets - 3 reps - 30 hold  Patient Education - Trigger Point Dry Needling  ASSESSMENT:  CLINICAL IMPRESSION: Pt has met 2 long term goals and the bloating is still getting better as pt is able to figure out what foods are contributing.  Pt still having leakage with sneezing and now able to fully participate in activities such as jumping due to leakage.  Pt was able to do kegel exercise to work on timing with exhale today.  This was added to HEP to continue to progress at home.  Pt will require skilled PT to continue to address strength and coordination for functional activities without leakage  OBJECTIVE IMPAIRMENTS: decreased coordination, decreased ROM, decreased strength, increased muscle spasms, impaired flexibility, impaired sensation, impaired tone, postural dysfunction, and pain.   ACTIVITY LIMITATIONS: standing, continence, and toileting  PARTICIPATION LIMITATIONS: interpersonal relationship and community activity  PERSONAL FACTORS: 1-2 comorbidities: vaginal delivery, cholecystectomy, GI condition that is unknown, chronic pain  are also affecting patient's functional outcome.   REHAB POTENTIAL: Good  CLINICAL DECISION MAKING: Evolving/moderate complexity  EVALUATION COMPLEXITY: Moderate   GOALS: Goals reviewed with patient? Yes  SHORT TERM GOALS: Target date: 08/09/22  Ind with initial HEP Baseline: Goal status: MET    LONG TERM GOALS: Target date: 10/04/22 Updated 09/18/22  Pt will be independent with advanced HEP to maintain improvements made throughout therapy  Baseline:  Goal status: IN PROGRESS  2.  Pt will report 75% reduction of pain due to improvements in posture, strength, and muscle length  Baseline: 90% better Goal status: MET  3.  Pt will be able to functional actions such as coughing  or jumping without leakage or feeling as if it might happen Baseline: rarely coughing, sneezing definitely causes leaks, have to cross legs Goal status: IN PROGRESS  4.  Pt will report 50% less abdominal bloating and discomfort due to improved muscle tone throughout the core  Baseline: bloating is 70% and feel like it could definitely be better Goal status: MET    PLAN:  PT FREQUENCY: 1x/week  PT DURATION: 12 weeks  PLANNED INTERVENTIONS: Therapeutic exercises, Therapeutic activity, Neuromuscular re-education, Balance training, Gait training, Patient/Family education, Self Care, Joint mobilization, Aquatic Therapy, Dry Needling, Electrical stimulation, Spinal mobilization, Cryotherapy, Moist heat, scar mobilization, Taping, Traction, Biofeedback, Manual therapy, and Re-evaluation  PLAN FOR NEXT SESSION: f/u on pain/bloating; continue core progression, quadruped and supine  on wedge, activate anterior pelvic floor   Brayton Caves Laurren Lepkowski, PT 09/18/2022, 9:31 AM

## 2022-09-25 ENCOUNTER — Encounter: Payer: Medicaid Other | Attending: Psychology | Admitting: Psychology

## 2022-09-25 DIAGNOSIS — F411 Generalized anxiety disorder: Secondary | ICD-10-CM

## 2022-09-25 DIAGNOSIS — R41840 Attention and concentration deficit: Secondary | ICD-10-CM | POA: Diagnosis not present

## 2022-09-25 DIAGNOSIS — F330 Major depressive disorder, recurrent, mild: Secondary | ICD-10-CM | POA: Diagnosis not present

## 2022-09-25 NOTE — Progress Notes (Signed)
Neuropsychological Consultation   Patient:   Catherine Villarreal   DOB:   1990-11-11  MR Number:  [id number]  Location:  [facility] [place] [facility] [address] [place], [place] [address] [place] [address] Dept: [phone]           Date of Service:   08/16/2022  Location of Service and Individuals present: Today's visit was conducted in my outpatient clinic office with the patient myself present.  Start Time:   8 AM End Time:   10 AM  Patient Consent and Confidentiality: Limits of confidentiality were reviewed and consent obtained.  Patient referred for neuropsychological evaluation by her treating psychiatrist [name], MD due to attentional difficulties in the setting of anxiety and depressive symptoms as well.  Patient consents to have formal neuropsychological evaluation with results being forwarded to her referring physician and made available in the patient's EMR.  Consent for Evaluation and Treatment:  Signed:  Yes Explanation of Privacy Policies:  Signed:  Yes Discussion of Confidentiality Limits:  Yes  Provider/Observer:  Arley Phenix, Psy.D.       Clinical Neuropsychologist       Billing Code/Service: 96116/96121  Chief Complaint:     Chief Complaint  Patient presents with   Anxiety   Depression   Other    Attention and Concentration Difficulties    Reason for Service:    Catherine Villarreal is a 32 year old female referred for neuropsychological evaluation by her treating psychiatrist [name], MD with request for differential diagnostic considerations.  Patient has a history of significant depressive events and fatigue.  Patient reports that she is not sure when her attentional issues really started but reports that she feels like it has gotten worse over the past 3 to 4 years.  The patient reports that in school she was always a "big procrastinator" and would put off work until the last minute.   The patient reports that she has trouble retrieving information that she has recently learned and now she writes things down to aid in recall.  The patient describes primary difficulties related to maintaining focus, increased irritability, anxiety and depression.  Patient reports that at times she will just stop "midsentence" when trying to express herself.  Patient reports that the symptoms get worse when her anxiety gets worse and increasing life stressors.  Patient reports that her attentional issues did worsen 3 to 4 years ago and she will often get "mental blocks" in her thoughts.  Patient reports that she really started pain attention these difficulties when she got pregnant but her significant other has told her that they noticed these issues prior to her pregnancy.  Patient reports that she now has 3 stepsons as well as one of her own biological children and worries about everything having to do with them.  The patient reports that during her pregnancy in 2016 that her depression anxiety worsened and had significant impact on personal relationships.  The patient reports that she also has had GI issues with gallbladder removal.  Medical workups have been done regarding thyroid another potential causative factors.  Patient reports that she consumes a great deal of coffee/energy drinks to try to self medicate but she has cut back to 1-2 energy drinks per day and still has a fair amount of iced coffee each day.  In the past she consumed more energy drinks and she is now.  I cautioned her about the use of these  energy drinks and how they can be problematic for mood stability.  Patient does report that she has had long breaks without energy drinks and then it some point she will go back to using them to "make it through the day.  Patient reports that when her anxiety is particularly high that she feels like energy drinks well calm her down.  The patient denies any treatment of the left or concussed events  with loss of consciousness.  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  Patient has had 3 previous sleep studies conducted 1 sleep lab study as well as to home sleep studies with no apneic events noted.  The patient reports that she always feels tired even though she sleeps 8 hours plus every day.  Appetite is described as normal but that she only eats certain foods and has a fairly picky dietary pattern.  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  Patient also noted numbness or tingling/paresthesias.  Migraine headache, nausea, numbness in bilateral arms hands and feet, vision changes and depression were all noted as reasons for this study.  The MRI was interpreted by Jackey Loge, DO display no evidence of acute intercranial abnormality.  There was a 6 mm pineal cyst noted but otherwise unremarkable MRI appearance of brain.  Laboratory Tests: Patient has had decreased vitamin D laboratory results 2 years ago but within normal limits most recently, recent thyroid studies  within normal limits, A1c within normal limits.  Behavioral Observation/Mental Status:   Catherine Villarreal  presents as a 32 y.o.-year-old Right handed Caucasian Female who appeared her stated age. her dress was Appropriate and she was Well Groomed and her manners were Appropriate to the situation.  her participation was indicative of Appropriate and Redirectable behaviors.  There were not physical disabilities noted.  she displayed an appropriate level of cooperation and motivation.    Interactions:    Active Appropriate  Attention:   abnormal and attention span appeared shorter than expected for age  Memory:   within normal limits; recent and remote memory intact  Visuo-spatial:   not examined  Speech (Volume):  normal  Speech:   normal; normal  Thought Process:  Coherent and Relevant  Coherent, Linear, and Logical  Though Content:  WNL; not suicidal and not homicidal  Orientation:   person, place, time/date, and situation  Judgment:   Good  Planning:   Good  Affect:    Anxious  Mood:    Anxious and Dysphoric  Insight:   Good  Intelligence:   normal  Marital Status/Living:  Patient was born and raised in [place] along with 2 siblings.  No significant pregnancy/delivery or developmental milestones delays noted.  Patient currently lives with her significant other, son and 3 stepsons.  They have been together for the past 6 or 7 years.  Patient had 1 previous marriage that lasted for 1 year and she is currently engaged.  Educational and Occupational History:     Highest Level of Education:   Patient graduated from high school reports that she attended some college attending [employer] maintaining a 3.0 GPA.  Patient reports that the reason for leaving school had to do with financial cost.  Psychiatric History:    Abuse/Trauma History: Patient has a history of recurrent major depressive disorder as well as anxiety disorder and ongoing issues with  attention and concentration.  Current medications include Lamictal.  Patient reports that she feels like it is being helpful for her.  Family Med/Psych History:  Family History  Problem Relation Age of Onset   Cancer Mother    Breast cancer Mother    Thyroid cancer Mother    Other Mother    Thyroid disease Mother    Luiz Blare' disease Mother    Drug abuse Brother    Stroke Father    Thyroid disease Maternal Aunt    Thyroid disease Maternal Grandmother     Impression/DX:   Catherine Villarreal is a 32 year old female referred for neuropsychological evaluation by her treating psychiatrist [name], MD with request for differential diagnostic considerations.  Patient has a history of significant depressive events and fatigue.  Patient reports that she is not sure when her attentional issues really started but reports that she feels like it has gotten worse over the past 3 to 4 years.  The patient reports that in school she was always a "big procrastinator" and would put off work until the last minute.  The patient reports that she has trouble retrieving information that she has recently learned and now she writes things down to aid in recall.  The patient describes primary difficulties related to maintaining focus, increased irritability, anxiety and depression.  Patient reports that at times she will just stop "midsentence" when trying to express herself.  Patient reports that the symptoms get worse when her anxiety gets worse and increasing life stressors.  Patient reports that her attentional issues did worsen 3 to 4 years ago and she will often get "mental blocks" in her thoughts.  Patient reports that she really started pain attention these difficulties when she got pregnant but her significant other has told her that they noticed these issues prior to her pregnancy.  Patient reports that she now has 3 stepsons as well as one of her own biological children and worries about everything having to do with  them.  The patient reports that during her pregnancy in 2016 that her depression anxiety worsened and had significant impact on personal relationships.  Disposition/Plan:  We have set the patient for formal neuropsychological assessment we will start with a foundational battery including the comprehensive attention battery and the CAB continuous performance measure to provide an objective assessment of a wide range of attentional and executive functioning measures to aid in diagnostic considerations.  Once this structured battery is completed determination will be made as to any potential need for further neuropsychological assessment.  A formal report will be put together and made available to her referring psychiatrist as well as been made available in the patient's  EMR for appropriate medical professionals to review.  I will also have a formal feedback session with the patient to go over the results with any specific recommendations to her outside of those specific recommendations made to her treating physicians.  Diagnosis:    Attention and concentration deficit  MDD (major depressive disorder), recurrent episode, mild (HCC)        Note: This document was prepared using Dragon voice recognition software and may include unintentional dictation errors.   Electronically Signed   _______________________ Arley Phenix, Psy.D. Clinical Neuropsychologist

## 2022-09-27 ENCOUNTER — Ambulatory Visit: Payer: Medicaid Other | Attending: Obstetrics and Gynecology | Admitting: Physical Therapy

## 2022-09-27 DIAGNOSIS — M62838 Other muscle spasm: Secondary | ICD-10-CM | POA: Insufficient documentation

## 2022-09-27 DIAGNOSIS — R293 Abnormal posture: Secondary | ICD-10-CM | POA: Insufficient documentation

## 2022-09-27 DIAGNOSIS — M6281 Muscle weakness (generalized): Secondary | ICD-10-CM | POA: Insufficient documentation

## 2022-09-27 DIAGNOSIS — R279 Unspecified lack of coordination: Secondary | ICD-10-CM | POA: Insufficient documentation

## 2022-10-05 ENCOUNTER — Encounter (INDEPENDENT_AMBULATORY_CARE_PROVIDER_SITE_OTHER): Payer: Self-pay
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
## 2022-10-13 ENCOUNTER — Encounter: Payer: Self-pay | Admitting: Psychiatry

## 2022-10-13 ENCOUNTER — Telehealth (INDEPENDENT_AMBULATORY_CARE_PROVIDER_SITE_OTHER): Payer: Medicaid Other | Admitting: Psychiatry

## 2022-10-13 DIAGNOSIS — F330 Major depressive disorder, recurrent, mild: Secondary | ICD-10-CM

## 2022-10-13 NOTE — Patient Instructions (Signed)
Decrease lamotrigine 25 mg daily for one week, then discontinue  Next appointment- 10/25 at 11 am

## 2022-10-26 ENCOUNTER — Encounter: Payer: Self-pay | Admitting: Internal Medicine

## 2022-10-26 ENCOUNTER — Ambulatory Visit (INDEPENDENT_AMBULATORY_CARE_PROVIDER_SITE_OTHER): Payer: Medicaid Other | Admitting: Internal Medicine

## 2022-10-26 VITALS — BP 118/76 | HR 82 | Temp 97.7°F | Resp 16 | Ht 67.0 in | Wt 168.0 lb

## 2022-10-26 DIAGNOSIS — G43009 Migraine without aura, not intractable, without status migrainosus: Secondary | ICD-10-CM

## 2022-10-26 MED ORDER — NURTEC 75 MG PO TBDP
1.0000 | ORAL_TABLET | ORAL | 1 refills | Status: DC | PRN
Start: 2022-10-26 — End: 2022-11-02

## 2022-10-26 MED ORDER — UBRELVY 50 MG PO TABS
1.0000 | ORAL_TABLET | Freq: Every day | ORAL | 1 refills | Status: DC | PRN
Start: 2022-10-26 — End: 2022-11-02

## 2022-10-26 NOTE — Patient Instructions (Signed)

## 2022-10-26 NOTE — Progress Notes (Signed)
Subjective:  Patient ID: Catherine Villarreal, female    DOB: 1990-05-02  Age: 32 y.o. MRN: [id number]  CC: Migraine   HPI Catherine Villarreal presents for f/up -  Discussed the use of AI scribe software for clinical note transcription with the patient, who gave verbal consent to proceed.  History of Present Illness   The patient, with a history of gastrointestinal issues and migraines, recently consulted with a gastroenterologist. She was diagnosed with lactose intolerance and unspecified food intolerances. Despite dietary modifications, the patient reports no improvement in her migraines, which have progressively worsened to approximately two episodes per week. These migraines can last up to two days and are not responsive to over-the-counter pain medications.  The patient describes the migraines as unilateral, predominantly affecting the right side, and radiating from behind the eyeball to the neck. Certain triggers, such as prolonged exposure to car air conditioning and night driving with bright lights, have been identified. The patient has not yet tried any specific migraine treatments, but is open to both oral and injectable options.  In addition to migraines, the patient has been experiencing issues related to her menstrual cycle. Despite using a NuvaRing continuously for four weeks to manage pain, the patient reports that this method is no longer effective. She also reports significant bloating and weight loss due to restrictive eating habits aimed at managing her food intolerances. The patient has not identified a consistent pattern in foods that cause bloating.  The patient was previously on an unspecified medication, but it was discontinued by her psychiatrist due to concerns about potential contribution to the migraines.       Outpatient Medications Prior to Visit  Medication Sig Dispense Refill   esomeprazole (NEXIUM) 40 MG capsule TAKE 1 CAPSULE(40 MG) BY MOUTH DAILY 90 capsule 0    etonogestrel-ethinyl estradiol (NUVARING) 0.12-0.015 MG/24HR vaginal ring Insert vaginally and leave in place for 4 consecutive weeks. Replace every 4 weeks. 1 each 12   lamoTRIgine (LAMICTAL) 25 MG tablet Take 2 tablets (50 mg total) by mouth daily. 60 tablet 1   No facility-administered medications prior to visit.    ROS Review of Systems  Constitutional:  Positive for unexpected weight change. Negative for appetite change, chills, diaphoresis, fatigue and fever.  HENT: Negative.  Negative for trouble swallowing.   Eyes: Negative.   Respiratory:  Negative for cough, chest tightness, shortness of breath and wheezing.   Cardiovascular:  Negative for chest pain, palpitations and leg swelling.  Gastrointestinal: Negative.  Negative for abdominal pain, diarrhea, nausea and vomiting.  Endocrine: Negative.   Genitourinary: Negative.  Negative for difficulty urinating.  Musculoskeletal: Negative.   Skin:  Negative for color change and pallor.  Neurological:  Positive for headaches. Negative for dizziness and numbness.  Hematological:  Negative for adenopathy. Does not bruise/bleed easily.  Psychiatric/Behavioral: Negative.      Objective:  BP 118/76 (BP Location: Left Arm, Patient Position: Sitting, Cuff Size: Normal)   Pulse 82   Temp 97.7 F (36.5 C) (Oral)   Resp 16   Ht 5\' 7"  (1.702 m)   Wt 168 lb (76.2 kg)   LMP 10/19/2022 (Approximate)   SpO2 98%   BMI 26.31 kg/m   BP Readings from Last 3 Encounters:  10/26/22 118/76  08/24/22 (!) 121/94  06/06/22 (!) 122/91    Wt Readings from Last 3 Encounters:  10/26/22 168 lb (76.2 kg)  08/24/22 170 lb (77.1 kg)  06/06/22 179 lb 1.6 oz (81.2 kg)  Physical Exam Vitals reviewed.  Constitutional:      Appearance: Normal appearance.  HENT:     Nose: Nose normal.     Mouth/Throat:     Mouth: Mucous membranes are moist.  Eyes:     General: No scleral icterus.    Extraocular Movements: Extraocular movements intact.      Conjunctiva/sclera: Conjunctivae normal.     Pupils: Pupils are equal, round, and reactive to light.  Cardiovascular:     Rate and Rhythm: Normal rate and regular rhythm.     Heart sounds: No murmur heard. Pulmonary:     Effort: Pulmonary effort is normal.     Breath sounds: No stridor. No wheezing, rhonchi or rales.  Abdominal:     Palpations: There is no mass.     Tenderness: There is no abdominal tenderness. There is no guarding or rebound.     Hernia: No hernia is present.  Musculoskeletal:     Cervical back: Neck supple.  Lymphadenopathy:     Cervical: No cervical adenopathy.  Neurological:     Mental Status: She is alert.     Sensory: Sensation is intact.     Motor: Motor function is intact.     Coordination: Coordination is intact.     Gait: Gait is intact.     Deep Tendon Reflexes: Reflexes normal.     Reflex Scores:      Tricep reflexes are 1+ on the right side and 1+ on the left side.      Bicep reflexes are 1+ on the right side and 1+ on the left side.      Brachioradialis reflexes are 1+ on the right side and 1+ on the left side.      Patellar reflexes are 1+ on the right side and 1+ on the left side.      Achilles reflexes are 0 on the right side and 0 on the left side.    Lab Results  Component Value Date   WBC 8.2 02/06/2022   HGB 15.4 02/06/2022   HCT 45.3 02/06/2022   PLT 297 02/06/2022   GLUCOSE 105 (H) 02/06/2022   CHOL 218 (H) 03/01/2021   TRIG 412.0 (H) 03/14/2022   HDL 34.90 (L) 03/01/2021   LDLDIRECT 124.0 03/01/2021   LDLCALC 113 (H) 05/25/2020   ALT 27 03/14/2022   AST 33 03/14/2022   NA 139 02/06/2022   K 4.3 02/06/2022   CL 99 02/06/2022   CREATININE 0.88 02/06/2022   BUN 9 02/06/2022   CO2 23 02/06/2022   TSH 1.100 08/04/2022   INR 1.0 03/14/2022   HGBA1C 5.5 06/06/2022    DG Finger Middle Right  Result Date: 08/24/2022 CLINICAL DATA:  Mesh 3 fingers in car door. EXAM: RIGHT MIDDLE FINGER 2+V; RIGHT RING FINGER 2+V; RIGHT INDEX  FINGER 2+V COMPARISON:  None Available. FINDINGS: There is a nondisplaced fracture of the tuft of the distal phalanx of the third digit. The remaining bony structures are intact. The soft tissues are within normal limits. There is no evidence of arthropathy or other focal bone abnormality. IMPRESSION: Nondisplaced fracture of the tuft of the distal phalanx of the third digit. Electronically Signed   By: Thornell Sartorius M.D.   On: 08/24/2022 22:13   DG Finger Ring Right  Result Date: 08/24/2022 CLINICAL DATA:  Mesh 3 fingers in car door. EXAM: RIGHT MIDDLE FINGER 2+V; RIGHT RING FINGER 2+V; RIGHT INDEX FINGER 2+V COMPARISON:  None Available. FINDINGS: There is a nondisplaced fracture  of the tuft of the distal phalanx of the third digit. The remaining bony structures are intact. The soft tissues are within normal limits. There is no evidence of arthropathy or other focal bone abnormality. IMPRESSION: Nondisplaced fracture of the tuft of the distal phalanx of the third digit. Electronically Signed   By: Thornell Sartorius M.D.   On: 08/24/2022 22:13   DG Finger Index Right  Result Date: 08/24/2022 CLINICAL DATA:  Mesh 3 fingers in car door. EXAM: RIGHT MIDDLE FINGER 2+V; RIGHT RING FINGER 2+V; RIGHT INDEX FINGER 2+V COMPARISON:  None Available. FINDINGS: There is a nondisplaced fracture of the tuft of the distal phalanx of the third digit. The remaining bony structures are intact. The soft tissues are within normal limits. There is no evidence of arthropathy or other focal bone abnormality. IMPRESSION: Nondisplaced fracture of the tuft of the distal phalanx of the third digit. Electronically Signed   By: Thornell Sartorius M.D.   On: 08/24/2022 22:13    Assessment & Plan:   Migraine without aura and without status migrainosus, not intractable- I recommended that she take Nurtec every other day for headache prophylaxis and then take Ubrelvy as needed, for breakthrough headaches. -     Nurtec; Take 1 tablet (75 mg total)  by mouth every other day as needed.  Dispense: 46 tablet; Refill: 1 -     Ubrelvy; Take 1 tablet (50 mg total) by mouth daily as needed.  Dispense: 30 tablet; Refill: 1     Follow-up: Return in about 6 months (around 04/25/2023).  Sanda Linger, MD

## 2022-10-29 ENCOUNTER — Other Ambulatory Visit: Payer: Self-pay | Admitting: Psychiatry

## 2022-10-31 ENCOUNTER — Other Ambulatory Visit (HOSPITAL_COMMUNITY): Payer: Self-pay

## 2022-11-01 ENCOUNTER — Telehealth: Payer: Self-pay | Admitting: Internal Medicine

## 2022-11-01 NOTE — Telephone Encounter (Signed)
Prescription Request  11/01/2022  LOV: 10/26/2022  What is the name of the medication or equipment? Ubrogepant (UBRELVY) 50 MG TABS   Rimegepant Sulfate (NURTEC) 75 MG TBDP   Have you contacted your pharmacy to request a refill? No   Which pharmacy would you like this sent to?  [facility] [phone] - [place], [place] - [address] AT [facility] [address] [place] [address] Phone: [phone] Fax: [phone]    Patient notified that their request is being sent to the clinical staff for review and that they should receive a response within 2 business days.   Please advise at Mobile [phone] (mobile)

## 2022-11-02 ENCOUNTER — Other Ambulatory Visit (HOSPITAL_COMMUNITY): Payer: Self-pay

## 2022-11-02 ENCOUNTER — Telehealth: Payer: Self-pay

## 2022-11-02 ENCOUNTER — Other Ambulatory Visit: Payer: Self-pay | Admitting: Internal Medicine

## 2022-11-02 DIAGNOSIS — G43009 Migraine without aura, not intractable, without status migrainosus: Secondary | ICD-10-CM

## 2022-11-02 MED ORDER — NURTEC 75 MG PO TBDP: 1.0000 | ORAL_TABLET | ORAL | 1 refills | Status: DC | PRN

## 2022-11-02 MED ORDER — UBRELVY 50 MG PO TABS: 1.0000 | ORAL_TABLET | Freq: Every day | ORAL | 1 refills | Status: DC | PRN

## 2022-11-02 NOTE — Telephone Encounter (Signed)
Pharmacy Patient Advocate Encounter   Received notification from Patient Pharmacy that prior authorization for Ubrelvy 50MG  tablets is required/requested.   Insurance verification completed.   The patient is insured through [facility] .   Per test claim: PA required; PA submitted to [facility] via CoverMyMeds Key/confirmation #/EOC [id number] Status is pending

## 2022-11-02 NOTE — Telephone Encounter (Signed)
Pharmacy Patient Advocate Encounter   Received notification from Patient Pharmacy that prior authorization for Nurtec 75MG  dispersible tablets is required/requested.   Insurance verification completed.   The patient is insured through [facility] .   Per test claim: PA required; PA submitted to [facility] via CoverMyMeds Key/confirmation #/EOC [id number] Status is pending

## 2022-11-03 ENCOUNTER — Telehealth: Payer: Self-pay | Admitting: Internal Medicine

## 2022-11-03 NOTE — Telephone Encounter (Signed)
Patient called to check on the status of her prior authorizations for Ubrelvy and Nurtec. They are currently pending with the PA team. However, patient spoke with her insurance and was told if her doctor calls, they can push it through. She would like to know if this would be possible for her PCP to do. She said her migraines are debilitating and she cannot do anything when she has one. Best callback is [phone].

## 2022-11-06 ENCOUNTER — Other Ambulatory Visit: Payer: Self-pay | Admitting: Obstetrics & Gynecology

## 2022-11-06 ENCOUNTER — Telehealth: Payer: Self-pay | Admitting: Internal Medicine

## 2022-11-06 ENCOUNTER — Other Ambulatory Visit: Payer: Self-pay | Admitting: Internal Medicine

## 2022-11-06 ENCOUNTER — Other Ambulatory Visit: Payer: Self-pay

## 2022-11-06 ENCOUNTER — Ambulatory Visit (INDEPENDENT_AMBULATORY_CARE_PROVIDER_SITE_OTHER): Payer: Medicaid Other | Admitting: Obstetrics & Gynecology

## 2022-11-06 VITALS — BP 123/84 | HR 97 | Wt 169.0 lb

## 2022-11-06 DIAGNOSIS — Z975 Presence of (intrauterine) contraceptive device: Secondary | ICD-10-CM | POA: Diagnosis not present

## 2022-11-06 DIAGNOSIS — G44041 Chronic paroxysmal hemicrania, intractable: Secondary | ICD-10-CM

## 2022-11-06 DIAGNOSIS — N63 Unspecified lump in unspecified breast: Secondary | ICD-10-CM

## 2022-11-06 DIAGNOSIS — G8929 Other chronic pain: Secondary | ICD-10-CM | POA: Diagnosis not present

## 2022-11-06 DIAGNOSIS — R102 Pelvic and perineal pain: Secondary | ICD-10-CM

## 2022-11-06 DIAGNOSIS — N921 Excessive and frequent menstruation with irregular cycle: Secondary | ICD-10-CM | POA: Diagnosis not present

## 2022-11-06 DIAGNOSIS — G43009 Migraine without aura, not intractable, without status migrainosus: Secondary | ICD-10-CM

## 2022-11-06 MED ORDER — EMGALITY 120 MG/ML ~~LOC~~ SOAJ
240.0000 mg | Freq: Once | SUBCUTANEOUS | 0 refills | Status: AC
Start: 2022-11-06 — End: 2022-11-06

## 2022-11-06 NOTE — Progress Notes (Signed)
Patient ID: Catherine Villarreal, female   DOB: 19-Mar-1990, 32 y.o.   MRN: [id number]  Chief Complaint  Patient presents with   Gynecologic Exam    HPI Catherine Villarreal is a 32 y.o. female.  [id number] Patient's last menstrual period was 09/24/2022 (approximate). She felt a pea-size mass in right axilla with tenderness 2 weeks ago and the mass is gone but the area is still tender. She is concerned due to strong family history of breast cancer. Continues to have pelvic and low back pain with BTB using NuvaRing continuously. Imaging ordered by Dr. Briscoe Deutscher was normal. HPI  Past Medical History:  Diagnosis Date   Anxiety and depression    Chronic headaches    Gallstones    Kidney stones    Migraine     Past Surgical History:  Procedure Laterality Date   CHOLECYSTECTOMY  2018    Family History  Problem Relation Age of Onset   Cancer Mother    Breast cancer Mother    Thyroid cancer Mother    Other Mother    Thyroid disease Mother    Luiz Blare' disease Mother    Drug abuse Brother    Stroke Father    Thyroid disease Maternal Aunt    Thyroid disease Maternal Grandmother     Social History Social History   Tobacco Use   Smoking status: Former    Current packs/day: 0.75    Types: Cigarettes   Smokeless tobacco: Never  Vaping Use   Vaping status: Every Day  Substance Use Topics   Alcohol use: Not Currently    Comment: quit Oct 2021   Drug use: Never    Allergies  Allergen Reactions   Topamax [Topiramate] Other (See Comments)    Headaches worsened   Dilaudid [Hydromorphone Hcl]     migraine   Adhesive [Tape] Rash    Current Outpatient Medications  Medication Sig Dispense Refill   esomeprazole (NEXIUM) 40 MG capsule TAKE 1 CAPSULE(40 MG) BY MOUTH DAILY 90 capsule 0   etonogestrel-ethinyl estradiol (NUVARING) 0.12-0.015 MG/24HR vaginal ring Insert vaginally and leave in place for 4 consecutive weeks. Replace every 4 weeks. 1 each 12   Galcanezumab-gnlm (EMGALITY) 120 MG/ML SOAJ  Inject 240 mg into the skin once for 1 dose. (Patient not taking: Reported on 11/06/2022) 2.24 mL 0   Rimegepant Sulfate (NURTEC) 75 MG TBDP Take 1 tablet (75 mg total) by mouth every other day as needed. (Patient not taking: Reported on 11/06/2022) 46 tablet 1   Ubrogepant (UBRELVY) 50 MG TABS Take 1 tablet (50 mg total) by mouth daily as needed. (Patient not taking: Reported on 11/06/2022) 30 tablet 1   No current facility-administered medications for this visit.    Review of Systems Review of Systems  Constitutional: Negative.   Respiratory: Negative.    Cardiovascular: Negative.   Gastrointestinal: Negative.   Genitourinary:  Positive for pelvic pain.    Blood pressure 123/84, pulse 97, weight 169 lb (76.7 kg), last menstrual period 09/24/2022.  Physical Exam Physical Exam Vitals and nursing note reviewed. Exam conducted with a chaperone present.  Constitutional:      Appearance: Normal appearance.  HENT:     Head: Normocephalic and atraumatic.  Chest:  Breasts:    Right: Normal.     Left: Normal.  Abdominal:     General: Abdomen is flat.     Palpations: Abdomen is soft.  Lymphadenopathy:     Upper Body:     Right upper body: No axillary  adenopathy.     Left upper body: No axillary adenopathy.  Neurological:     Mental Status: She is alert.  Psychiatric:        Mood and Affect: Mood normal.        Behavior: Behavior normal.     Data Reviewed [place] 06/2022 normal  Assessment Breakthrough bleeding with NuvaRing  Chronic pelvic pain in female  Breast mass in female   Plan .  Orders Placed This Encounter  Procedures   MM Digital Diagnostic Bilat    Standing Status:   Future    Standing Expiration Date:   03/08/2023    Order Specific Question:   Reason for exam:    Answer:   Prior abnormal study    Order Specific Question:   Is the patient pregnant?    Answer:   No    Order Specific Question:   Preferred imaging location?    Answer:   [facility]    F/u with Dr. Briscoe Deutscher re pelvic pain Continue current medication   Scheryl Darter 11/06/2022, 4:33 PM

## 2022-11-06 NOTE — Telephone Encounter (Signed)
Patient needs a prior authorization started for Galcanezumab-gnlm [facility]) 120 MG/ML SOAJ. Best callback is [phone].

## 2022-11-06 NOTE — Progress Notes (Signed)
R breast  Possibly lymph node near armpit  Pea size lump  Tendered for 2 wks - noticed it  Last time you noticed was Thursday, unsure if it is still there, still tender  No discharge

## 2022-11-06 NOTE — Telephone Encounter (Signed)
Patient spoke with her insurance and they said they either need more information or a cheaper option for the prior authorization for Ubrelvy. Best callback is [phone].

## 2022-11-06 NOTE — Telephone Encounter (Signed)
Pharmacy Patient Advocate Encounter  Received notification from [facility] that Prior Authorization for Ubrelvy 50gm tab has been DENIED.  See denial reason below. No denial letter attached in CMM. Will attache denial letter to Media tab once received.   PA #/Case ID/Reference #: [id number]

## 2022-11-06 NOTE — Telephone Encounter (Signed)
Pharmacy Patient Advocate Encounter  Received notification from [facility] that Prior Authorization for Nurtec 75mg  has been DENIED.  See denial reason below. No denial letter attached in CMM. Will attache denial letter to Media tab once received.   PA #/Case ID/Reference #:  [id number]

## 2022-11-07 ENCOUNTER — Other Ambulatory Visit (HOSPITAL_COMMUNITY): Payer: Self-pay

## 2022-11-07 ENCOUNTER — Telehealth: Payer: Self-pay | Admitting: Internal Medicine

## 2022-11-07 ENCOUNTER — Telehealth: Payer: Self-pay

## 2022-11-07 ENCOUNTER — Other Ambulatory Visit: Payer: Self-pay | Admitting: Internal Medicine

## 2022-11-07 DIAGNOSIS — G44041 Chronic paroxysmal hemicrania, intractable: Secondary | ICD-10-CM

## 2022-11-07 DIAGNOSIS — G43009 Migraine without aura, not intractable, without status migrainosus: Secondary | ICD-10-CM

## 2022-11-07 MED ORDER — RIZATRIPTAN BENZOATE 10 MG PO TABS
10.0000 mg | ORAL_TABLET | ORAL | 5 refills | Status: DC | PRN
Start: 2022-11-07 — End: 2022-11-15

## 2022-11-07 NOTE — Telephone Encounter (Signed)
Pharmacy Patient Advocate Encounter   Received notification from Pt Calls Messages that prior authorization for Emgality 120mg /ml is required/requested.   Insurance verification completed.   The patient is insured through  [facility]  .   Per test claim: PA required; PA submitted to [employer] via CoverMyMeds Key/confirmation #/EOC  [id number] Status is pending

## 2022-11-07 NOTE — Telephone Encounter (Signed)
Patient's prior authorizations for Bernita Raisin and Nurtec have been denied. There is a prior authorization submitted for Emgality as an alternative. Patient said she received the denial letter and would like to know what can be done. She is open to trying a different medication that was listed on the letter. Best callback is [phone].

## 2022-11-09 NOTE — Telephone Encounter (Signed)
Pharmacy Patient Advocate Encounter  Received notification from  [facility]  that Prior Authorization for Emgality has been DENIED.  See denial reason below. No denial letter attached in CMM. Will attache denial letter to Media tab once received.   PA #/Case ID/Reference #:  [id number]

## 2022-11-13 ENCOUNTER — Telehealth: Payer: Self-pay | Admitting: Internal Medicine

## 2022-11-13 ENCOUNTER — Other Ambulatory Visit: Payer: Medicaid Other

## 2022-11-13 NOTE — Telephone Encounter (Signed)
Patient said she thinks the rizatriptan (MAXALT) 10 MG tablet is not helping. Her she has had a migraine every day since 11/11/22. She said they don't usually last longer than 2 days.  Patient also said she received a denial letter for [employer] from her insurance. It said she needs to try lifestyle modification and behavioral therapy first. She has already tried both and her psychiatrist can attest to that. She would like to know if that information can be sent to the insurance company to try to approve the medication. Her migraines are a reoccurring problem and she is having a hard time without the medication.  Patient would like a call back at [phone].

## 2022-11-14 ENCOUNTER — Other Ambulatory Visit: Payer: Self-pay | Admitting: Internal Medicine

## 2022-11-14 DIAGNOSIS — G43009 Migraine without aura, not intractable, without status migrainosus: Secondary | ICD-10-CM

## 2022-11-15 ENCOUNTER — Telehealth: Payer: Self-pay

## 2022-11-15 ENCOUNTER — Other Ambulatory Visit: Payer: Medicaid Other

## 2022-11-15 DIAGNOSIS — G43009 Migraine without aura, not intractable, without status migrainosus: Secondary | ICD-10-CM

## 2022-11-15 MED ORDER — SUMATRIPTAN SUCCINATE 50 MG PO TABS
50.0000 mg | ORAL_TABLET | ORAL | 1 refills | Status: DC | PRN
Start: 2022-11-15 — End: 2022-11-16

## 2022-11-15 NOTE — Progress Notes (Signed)
Care Guide Note  11/15/2022 Name: Catherine Villarreal MRN: [id number] DOB: 1990-11-15  Referred by: Etta Grandchild, MD Reason for referral : Care Coordination (Outreach to schedule with Pharm d )   Catherine Villarreal is a 32 y.o. year old female who is a primary care patient of Etta Grandchild, MD. Catherine Villarreal was referred to the pharmacist for assistance related to  migraine .    Successful contact was made with the patient to discuss pharmacy services including being ready for the pharmacist to call at least 5 minutes before the scheduled appointment time, to have medication bottles and any blood sugar or blood pressure readings ready for review. The patient agreed to meet with the pharmacist via with the pharmacist via telephone visit on (date/time).  11/15/2022   SIG

## 2022-11-15 NOTE — Patient Instructions (Signed)
It was a pleasure speaking with you today!  A prescription for sumatriptan for migraine treatment was sent to [facility] for you to pick up.  I have submitted a new prior authorization for Emgality over the phone with your insurance. We should hear back within 24 hours by fax on the decision.   Feel free to give me a call at my direct number below for questions or concerns.  Arbutus Leas, PharmD, BCPS [facility] [phone]

## 2022-11-15 NOTE — Progress Notes (Signed)
11/15/2022 Name: Catherine Villarreal MRN: [id number] DOB: February 25, 1990  Chief Complaint  Patient presents with   Medication Access    Catherine Villarreal is a 32 y.o. year old female who presented for a telephone visit.   They were referred to the pharmacist by their PCP for assistance in managing medication access.   Subjective:  Care Team: Primary Care Provider: Etta Grandchild, MD ; Next Scheduled Visit: 04/25/2023   Medication Access/Adherence  Current Pharmacy:  [facility] [phone] - [place], [place] - [address] AT [facility] [address] [place] [address] Phone: [phone] Fax: [phone]   Patient reports affordability concerns with their medications: No  Patient reports access/transportation concerns to their pharmacy: No  Patient reports adherence concerns with their medications:  No     Pt has been unable to get medications for migraine treatment/prevention due to prior auth denials. She tried rizatriptan which was ineffective and caused side effects (felt out of it, lethargic, chest pain). She is on day 5 of a migraine. PCP sent in [place] and [place], both were denied. Medicaid prefers trial of sumatriptan first. Emgality was also sent, which was denied due to pt needing to try PT, behavioral therapy, or lifestyle modifications, which pt has already tried.   Migraine triggers: car headlights at night, AC in the car Has previously tried cutting out caffeine, stopped taking OTC meds to see if rebound HAs, has ruled out sleep hygiene and food as triggers Has done physical therapy, dry needling  Migraine at least every week for >3 months Pt reports she is not planning to get pregnant    Objective:  Assessment/Plan:   Order sent for PCP to sign off on for sumatriptan for acute treatment Will call [facility] [phone] PA #/Case ID/Reference #:  [id number]) to attempt to do an appeal for Emgality over the  phone. Was able to speak to the prior auth department and submitted another prior auth by phone for [facility]. There is a 24 hour processing time and decision will be faxed. **The PA based on the Rx submitted was only for 1 month supply for the loading dose, may need to complete this process again for the maintenance dose Let patient know of the above and provided direct call back number.   Follow Up Plan: PRN  Arbutus Leas, PharmD, BCPS [facility] [phone]

## 2022-11-16 ENCOUNTER — Other Ambulatory Visit: Payer: Self-pay | Admitting: Pharmacist

## 2022-11-16 ENCOUNTER — Other Ambulatory Visit: Payer: Self-pay | Admitting: Internal Medicine

## 2022-11-16 DIAGNOSIS — G43009 Migraine without aura, not intractable, without status migrainosus: Secondary | ICD-10-CM

## 2022-11-16 DIAGNOSIS — K2100 Gastro-esophageal reflux disease with esophagitis, without bleeding: Secondary | ICD-10-CM

## 2022-11-16 MED ORDER — SUMATRIPTAN SUCCINATE 50 MG PO TABS
50.0000 mg | ORAL_TABLET | ORAL | 1 refills | Status: DC | PRN
Start: 2022-11-16 — End: 2023-03-02

## 2022-11-16 NOTE — Progress Notes (Unsigned)
Patient called to inform that sumatriptan that was sent yesterday was denied. A [employer] representative told pt it was due to Rx not being sent for 12 tablets for a 23 day supply. Resent sumatriptan prescription for that specific quantity and DS.  Also waiting to hear back about Emgality prior auth that was resubmitted yesterday.  Pt plans to call [facility] to check if these meds are going through.  11/17/22: Pt called stating she received notice that Emgality was approved however the pharmacy is getting a rejection for the 240 mg loading dose that was prescribed, even though the PA was entered for the 240 mg dose. She states the 120 mg dose is going through. She is waiting to hear back from someone at [facility] to get clarification.  Arbutus Leas, PharmD, BCPS [facility] [phone]

## 2022-11-19 NOTE — Progress Notes (Addendum)
Neuropsychological Evaluation   Patient:  Catherine Villarreal   DOB: [birth year]  MR Number: [id number]  Location: [facility] [place] [facility] [address] [place], [address] [place] [address] Dept: [phone]  Start: 3 PM End: 4 PM  Provider/Observer:     Hershal Coria PsyD  Chief Complaint:      Chief Complaint  Patient presents with   Anxiety   Depression   Other    Attention and concentration difficulties    Reason For Service:     Catherine Villarreal is a 32 year old female referred for neuropsychological evaluation by her treating psychiatrist [name], MD, with request for differential diagnostic considerations.  Patient has a history of significant depressive events and fatigue.  Patient reports that she is not sure when her attentional issues really started but reports that she feels like it has gotten worse over the past 3 to 4 years.  The patient reports that in school she was always a "big procrastinator" and would put off work until the last minute.  The patient reports that she has trouble retrieving information that she has recently learned and now she writes things down to aid in recall.  The patient describes primary difficulties related to maintaining focus, increased irritability, anxiety and depression.  Patient reports that at times she will just stop "midsentence" when trying to express herself.  Patient reports that the symptoms get worse when her anxiety gets worse and increasing life stressors.  Patient reports that her attentional issues did worsen 3 to 4 years ago and she will often get "mental blocks" in her thoughts.  Patient reports that she really started paying attention to these difficulties when she got pregnant but her significant other has told her that they noticed these issues prior to her pregnancy.  Patient reports that she now has 3 stepsons as well as one of her own biological  children and worries about everything having to do with them.  The patient reports that during her pregnancy in 2016 that her depression and anxiety worsened and had significant impact on personal relationships.   The patient reports that she also has had GI issues with gallbladder removal.  Medical workups have been done regarding thyroid and other potential causative factors.  Patient reports that she consumes a great deal of coffee/energy drinks to try to self medicate but she has cut back to 1-2 energy drinks per day and still has a fair amount of iced coffee each day.  In the past, she consumed more energy drinks than she is now.  I cautioned her about the use of these energy drinks and how they can be problematic for mood stability.  Patient does report that she has had long breaks without energy drinks and then it some point she will go back to using them to "make it through the day.  Patient reports that when her anxiety is particularly high, that she feels like energy drinks well calm her down.   The patient denies any sustained exposure to any type of toxic elements or concussed events with loss of consciousness.  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  Patient has had 3 previous sleep studies conducted, 1 sleep lab study as well as two home sleep studies with no apneic events noted.  The patient reports that she  always feels tired even though she sleeps 8 hours plus every day.  Appetite is described as normal but that she only eats certain foods and has a fairly picky dietary pattern.   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  Patient also noted numbness or tingling/paresthesias.  Migraine headache, nausea, numbness in bilateral arms hands and feet, vision changes and depression were all noted as reasons for this study.  The MRI was interpreted by Jackey Loge, DO display no evidence of acute intercranial abnormality.  There was a 6 mm pineal cyst noted but otherwise unremarkable MRI appearance of brain.   Laboratory Tests: Patient has had decreased vitamin D laboratory results 2 years ago but within normal limits most recently, recent thyroid studies within normal limits, A1c within normal limits.  Tests Administered: Comprehensive Attention Battery (CAB) Continuous Performance Test (CPT)  Participation Level:   Active  Participation Quality:  Appropriate      Behavioral Observation:  The patient appeared well-groomed and appropriately dressed. Her manners were polite and appropriate to the situation. The patient's attitude towards testing was positive and her effort was good.   Well Groomed, Alert, and Appropriate.   Test Results:   In order to objectively assess a wide range of attention and executive functioning variables and domains the patient was administered the comprehensive attention battery and the CAB CPT measures.  The patient appeared to approach  this assessment and straightforward and consistent matter appearing to try her hardest throughout the assessment itself.  Embedded validity checks including pure reaction time measures and aspects of encoding measures all point to a valid assessment with no indications of attempts to exaggerate or mimic any type of deficit or difficulty.  This does appear to be a fair and valid assessment of the patient's current attention and executive functioning.  Initially, the patient was administered the auditory/visual pure reaction time measures.  On the pure visual reaction time measure the patient correctly responded to 49 of 50 targets with only 1 error of omission.  Her average response time was 300 ms and both accuracy and response time measures were well within normative expectations.  On the pure auditory  reaction time measure a similar efficient performance was noted with the patient correctly responding to 50 of 50 targets with no errors of omission.  Average response time was 312 ms which is also equal to or somewhat better than normative expectations.  On the discriminate reaction time measures the patient also did quite well.  On the visual discriminate reaction time measure she correctly responded to 35 of 35 targets with no errors of omission and no errors of commission.  Average response time was 391 ms.  Both accuracy and response time measures were within normal limits.  The patient had perfect performance on the auditory discriminate reaction time measure as well as the shift discriminant reaction time measure as well.  She had no errors of omission and no errors of commission on either.  Average response time for the auditory discriminate reaction time measure was 593 ms which was within normal limits and for the shift discriminant reaction time measure it was 560 ms which was also within normative expectations.  On the auditory/visual scan reaction time measure she again displayed perfect  performance as far as accuracy on all 3 measures including the visual, auditory and mixed scan reaction time measures.  There were no errors of omission and no errors of commission noted.  This is approaching 1 standard deviation better than her normative comparison group for accuracy.  Average response times were 356 ms for the visual subtest, 804 ms for the auditory subtest and 681 ms for the mixed subtest.  All of these are quite efficient scores ranging from roughly 1 standard deviation to 1-1/2 standard deviations better than normative expectations.  On the auditory/visual encoding measure the patient continued her excellent performance.  On the auditory forwards and auditory backwards encoding measures the patient performed consistent with normative expectations for the auditory forwards and nearly 2 standard deviations better than normative comparison groups for auditory backwards.  The patient continued with excellent performance for visual encoding measures performing 1-1-1/2 standard deviations better than normative expectations for visual forwards and visual backwards encoding measures.  The patient displayed excellent primary encoding capacity as well as the ability to process information in both her auditory and visual Register.  Next, the patient was administered the Stroop interference cancellation test.  The patient continued her excellent performance.  While she initially had some difficulty adjusting to the instructions and strategies during the very first 15-second trial of the 9 interference components she quickly adapted and by the second trial she was getting 14 of 18 targets.  She continued this excellent performance and by the fourth 9 interference trial she got 17 of 18 targets within the 15 seconds.  This measure then quickly transitions to a targeted auditory interference phase.  While the patient is instructed before this shift that it will happen during the instruction portion of the  measure they do not get a chance to practice working under this targeted auditory interference.  On the first interference trial the patient showed no impact of this targeted interference correctly responding to 17 of 18 targets and she maintained excellent performance and on the third and fourth series of the targeted interference trials she achieved 18 of 18 targets on both.  There were no indications of difficulty with avoiding external distractors and the patient consistently showed excellent focus execute abilities.  Finally, the patient was administered the visual monitor CPT measure of the comprehensive attention battery.  This is a 15-minute continual performance measure that is of a visual discriminate  reaction time format.  It is broken down into five 3-minute blocks of time for analysis.  On the first 3 minutes of this measure the patient correctly responded to 30 of 30 targets with no errors of commission and no errors of omission.  Her average response time was 393 ms.  All of these are well within normative expectations.  The patient continued with her excellent performance and only had a total of 2 errors of omission during the entire 15 minutes of this measure.  Patient had no errors of commission throughout the entire measure.  Her average response time slowly increased but remained quite efficient.  In the 3-6-minute mark she had an average response time of 465 ms which is an efficient score.  Her performance bounced around at this level throughout the rest of the measure and by the final 3 minutes of the task her average was 490 ms.  This is a less than 100 ms variation and average response times and the patient showed great consistency in response time between the 3-minute mark and the 15-minute mark.  There were no indications of deficits with regard to sustaining attention and no increases in errors of omission as a function of time.   Impression/Diagnosis:   Overall, the results of the  current objective assessment of a wide range of attention and executive functioning domains were quite good and there were no objective findings of any type of attentional deficits.  The patient did well on sustained attention, focus execute abilities, auditory and visual encoding abilities, capacity to avoid external distractors and remain focused, shifting of attention including shifting between auditory and visual stimuli as well as freedom from distractibility.  The patient maintained consistent performance throughout this 2-hour assessment battery.  Taking into account these objective findings measuring multiple domains of attention and concentration in both the auditory and visual domains in conjunction with her clinical presentation and clinical history the patient does not appear to have patterns consistent with adult residual attention deficit disorder.  While the patient is unsure of when her attentional issues became most prominent she does note that she began having increasing difficulties after her first pregnancy including significant anxiety and notes that her attentional difficulties have worsened over the past 3 to 4 years.  This is not a typical pattern for attention deficit disorder but she does note attentional difficulties.  The patient notes a lifelong pattern of procrastination and worsening difficulties maintaining focus, increased irritability along with anxiety and depression in adulthood.  The patient also notes that while she will "sleep for 8 hours" that she feels tired and fatigued and unrested the next day.  The patient has a history of significant use of alkaloid type stimulant compounds including caffeine and the wide variety of compounds that can be found in energy/stimulant drinks.  While she has times without their use she also has times with excess use.  I am concerned to some degree that when she is in her phases where she is using a lot of these energy drinks and caffeine  based drinks that she may be exacerbating some of the anxiety and stressors through her self-medication as a trade off for treating her fatigue and somnolent type states.  This may have lingering impact on reducing the efficiency of her sleep patterns.  I do think that this should be addressed as she is likely causing shifts in various attentional elements with the on-and-off nature of use of these compounds including increasing the hyperfocus on  some aspects of her life resulting in reduced focus and attention and other aspects.  The patient is not currently taking any type of psychostimulants or psychotropic medications for her anxiety and depression and I do think that this should be looked at and considered by her psychiatrist.  The patient did take Effexor for some time in 2022 and there was a brief trial of Lamictal in the spring 2024.  I would encourage the patient to consider trying other SSRI type medications to see if she may find a better fit and help with some of the anxiety and depressive types of symptoms as there occurrence will likely have some benefit as far as her perception of attention and concentration issues.  I do think that the primary culprit in her attentional difficulties have to do with anxiety and depressive type symptoms rather than an underlying adult residual attention deficit disorder pattern.  I will sit down with the patient and go over the results of the current neuropsychological evaluation and provide specific recommendations particular around sleep and encourage the avoidance of alkaloid type stimulants as they are likely to worsen rather than help her symptoms on a more long-term basis.  Diagnosis:    MDD (major depressive disorder), recurrent episode, mild (HCC)  Generalized anxiety disorder  Attention and concentration deficit   _____________________ Arley Phenix, Psy.D. Clinical Neuropsychologist

## 2022-11-20 ENCOUNTER — Emergency Department (HOSPITAL_COMMUNITY)
Admission: EM | Admit: 2022-11-20 | Discharge: 2022-11-21 | Discharge status: Against Medical Advice | Payer: Medicaid Other | Attending: Emergency Medicine | Admitting: Emergency Medicine

## 2022-11-20 ENCOUNTER — Encounter (HOSPITAL_COMMUNITY): Payer: Self-pay | Admitting: Emergency Medicine

## 2022-11-20 ENCOUNTER — Other Ambulatory Visit: Payer: Self-pay

## 2022-11-20 DIAGNOSIS — R519 Headache, unspecified: Secondary | ICD-10-CM | POA: Insufficient documentation

## 2022-11-20 DIAGNOSIS — Z5321 Procedure and treatment not carried out due to patient leaving prior to being seen by health care provider: Secondary | ICD-10-CM | POA: Diagnosis not present

## 2022-11-20 NOTE — ED Triage Notes (Signed)
Pt arrives POV c/o headaches on and off for the past 8 days. Hx of same. Has been seen by PCP and prescribed medication but has been unable to get it filled due to issue with insurance.

## 2022-11-21 ENCOUNTER — Other Ambulatory Visit: Payer: Medicaid Other

## 2022-11-23 ENCOUNTER — Other Ambulatory Visit: Payer: Self-pay | Admitting: Pharmacist

## 2022-11-23 ENCOUNTER — Other Ambulatory Visit: Payer: Self-pay | Admitting: Internal Medicine

## 2022-11-23 DIAGNOSIS — G44041 Chronic paroxysmal hemicrania, intractable: Secondary | ICD-10-CM

## 2022-11-23 DIAGNOSIS — G43009 Migraine without aura, not intractable, without status migrainosus: Secondary | ICD-10-CM

## 2022-11-23 NOTE — Progress Notes (Signed)
-  Contacted [employer] regarding patient's Emgality being denied although PA was approved last week. Loading dose of 240 mg was being denied due to prior auth being submitted for the prefilled syringe while Rx sent was auto injector. PA was edited and corrected while on the phone and should go through now for patient to take the 240 mg loading dose then 120 mg monthly for migraine prophylaxis.  -While on the phone with [facility] pharmacy department, also asked about pt's sumatriptan that was getting denied due to pt having filled rizatriptan 11/08/2022. Triptans have a max of 12 tab per 23 days therefore sumatriptan is unable to be filled until 12/01/2022. Explained rizatriptan was ineffective and patient was also unable to tolerate due to side effect of lethargy. Prior auth for sumatriptan was submitted by phone for patient to hopefully get prior to 12/01/2022. Awaiting response. Case #[id number]  - Called patient to inform her of the above. She will call the pharmacy to see if Emgality will go through. She is aware we are waiting for sumatriptan PA response.  Arbutus Leas, PharmD, BCPS [facility] [phone]

## 2022-11-30 ENCOUNTER — Encounter: Payer: Medicaid Other | Attending: Psychology | Admitting: Psychology

## 2022-11-30 ENCOUNTER — Encounter: Payer: Self-pay | Admitting: Psychology

## 2022-11-30 DIAGNOSIS — R41840 Attention and concentration deficit: Secondary | ICD-10-CM | POA: Diagnosis not present

## 2022-11-30 DIAGNOSIS — F330 Major depressive disorder, recurrent, mild: Secondary | ICD-10-CM | POA: Insufficient documentation

## 2022-11-30 DIAGNOSIS — F411 Generalized anxiety disorder: Secondary | ICD-10-CM | POA: Insufficient documentation

## 2022-12-01 ENCOUNTER — Ambulatory Visit
Admission: RE | Admit: 2022-12-01 | Discharge: 2022-12-01 | Disposition: A | Discharge status: Home / Self Care | Payer: Medicaid Other | Source: Ambulatory Visit | Attending: Obstetrics & Gynecology | Admitting: Obstetrics & Gynecology

## 2022-12-01 DIAGNOSIS — N63 Unspecified lump in unspecified breast: Secondary | ICD-10-CM

## 2022-12-01 DIAGNOSIS — Z803 Family history of malignant neoplasm of breast: Secondary | ICD-10-CM | POA: Diagnosis not present

## 2022-12-01 DIAGNOSIS — M79621 Pain in right upper arm: Secondary | ICD-10-CM | POA: Diagnosis not present

## 2022-12-04 ENCOUNTER — Telehealth: Payer: Self-pay | Admitting: Internal Medicine

## 2022-12-04 ENCOUNTER — Encounter: Payer: Self-pay | Admitting: Psychology

## 2022-12-04 ENCOUNTER — Other Ambulatory Visit: Payer: Self-pay | Admitting: Internal Medicine

## 2022-12-04 DIAGNOSIS — G43009 Migraine without aura, not intractable, without status migrainosus: Secondary | ICD-10-CM

## 2022-12-04 DIAGNOSIS — G44041 Chronic paroxysmal hemicrania, intractable: Secondary | ICD-10-CM

## 2022-12-04 MED ORDER — EMGALITY 120 MG/ML ~~LOC~~ SOAJ
120.0000 mg | SUBCUTANEOUS | 0 refills | Status: DC
Start: 2022-12-04 — End: 2023-03-02

## 2022-12-04 NOTE — Progress Notes (Signed)
Neuropsychological Evaluation   Patient:  Catherine Villarreal   DOB: 1990/05/07  MR Number: [id number]  Location: [facility] [address] [place], [address] [place] [address] Dept: [phone]  Start: 1 PM End: 2 PM  Today's visit was conducted in my outpatient clinic office with the patient myself present.  Provider/Observer:     Hershal Coria PsyD  Chief Complaint:      Chief Complaint  Patient presents with   Anxiety   Depression   11/30/2022 1 PM-2 PM: Today I provided feedback regarding the results of the recent neuropsychological evaluation.  We went over the results and talked about specific treatment recommendations that may have not been included in depth in the evaluative report.  I have included the reason for service and summary of my evaluation below for convenience and the entire neuropsychological evaluation can be found in her EMR dated 09/25/2022 and the full clinical review can be found on 08/16/2022.  Reason For Service:     Catherine Villarreal is a 32 year old female referred for neuropsychological evaluation by her treating psychiatrist [name], MD, with request for differential diagnostic considerations.  Patient has a history of significant depressive events and fatigue.  Patient reports that she is not sure when her attentional issues really started but reports that she feels like it has gotten worse over the past 3 to 4 years.  The patient reports that in school she was always a "big procrastinator" and would put off work until the last minute.  The patient reports that she has trouble retrieving information that she has recently learned and now she writes things down to aid in recall.  The patient describes primary difficulties related to maintaining focus, increased irritability, anxiety and depression.  Patient reports that at times she will just stop "midsentence" when  trying to express herself.  Patient reports that the symptoms get worse when her anxiety gets worse and increasing life stressors.  Patient reports that her attentional issues did worsen 3 to 4 years ago and she will often get "mental blocks" in her thoughts.  Patient reports that she really started paying attention to these difficulties when she got pregnant but her significant other has told her that they noticed these issues prior to her pregnancy.  Patient reports that she now has 3 stepsons as well as one of her own biological children and worries about everything having to do with them.  The patient reports that during her pregnancy in 2016 that her depression and anxiety worsened and had significant impact on personal relationships.   The patient reports that she also has had GI issues with gallbladder removal.  Medical workups have been done regarding thyroid and other potential causative factors.  Patient reports that she consumes a great deal of coffee/energy drinks to try to self medicate but she has cut back to 1-2 energy drinks per day and still has a fair amount of iced coffee each day.  In the past, she consumed more energy drinks than she is now.  I cautioned her about the use of these energy drinks and how they can be problematic for mood stability.  Patient does report that she has had long breaks without energy drinks and then it some point she will go back to using them to "make it through the day.  Patient reports that when her anxiety is particularly high, that she feels like energy drinks  well calm her down.   The patient denies any sustained exposure to any type of toxic elements or concussed events with loss of consciousness.  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  Patient has had 3 previous sleep studies conducted, 1 sleep lab study as well as two home sleep studies with no  apneic events noted.  The patient reports that she always feels tired even though she sleeps 8 hours plus every day.  Appetite is described as normal but that she only eats certain foods and has a fairly picky dietary pattern.     Impression/Diagnosis:   Overall, the results of the current objective assessment of a wide range of attention and executive functioning domains were quite good and there were no objective findings of any type of attentional deficits.  The patient did well on sustained attention, focus execute abilities, auditory and visual encoding abilities, capacity to avoid external distractors and remain focused, shifting of attention including shifting between auditory and visual stimuli as well as freedom from distractibility.  The patient maintained consistent performance throughout this 2-hour assessment battery.  Taking into account these objective findings measuring multiple domains of attention and concentration in both the auditory and visual domains in conjunction with her clinical presentation and clinical history the patient does not appear to have patterns consistent with adult residual attention deficit disorder.  While the patient is unsure of when her attentional issues became most prominent she does note that she began having increasing difficulties after her first pregnancy including significant anxiety and notes that her attentional difficulties have worsened over the past 3 to 4 years.  This is not a typical pattern for attention deficit disorder but she does note attentional difficulties.  The patient notes a lifelong pattern of procrastination and worsening difficulties maintaining focus, increased irritability along with anxiety and depression in adulthood.  The patient also notes that while she will "sleep for 8 hours" that she feels tired and fatigued and unrested the next day.  The patient has a history of significant use of alkaloid type stimulant compounds including  caffeine and the wide variety of compounds that can be found in energy/stimulant drinks.  While she has times without their use she also has times with excess use.  I am concerned to some degree that when she is in her phases where she is using a lot of these energy drinks and caffeine based drinks that she may be exacerbating some of the anxiety and stressors through her self-medication as a trade off for treating her fatigue and somnolent type states.  This may have lingering impact on reducing the efficiency of her sleep patterns.  I do think that this should be addressed as she is likely causing shifts in various attentional elements with the on-and-off nature of use of these compounds including increasing the hyperfocus on some aspects of her life resulting in reduced focus and attention and other aspects.  The patient is not currently taking any type of psychostimulants or psychotropic medications for her anxiety and depression and I do think that this should be looked at and considered by her psychiatrist.  The patient did take Effexor for some time in 2022 and there was a brief trial of Lamictal in the spring 2024.  I would encourage the patient to consider trying other SSRI type medications to see if she may find a better  fit and help with some of the anxiety and depressive types of symptoms as there occurrence will likely have some benefit as far as her perception of attention and concentration issues.  I do think that the primary culprit in her attentional difficulties have to do with anxiety and depressive type symptoms rather than an underlying adult residual attention deficit disorder pattern.  I will sit down with the patient and go over the results of the current neuropsychological evaluation and provide specific recommendations particular around sleep and encourage the avoidance of alkaloid type stimulants as they are likely to worsen rather than help her symptoms on a more long-term  basis.  Diagnosis:    MDD (major depressive disorder), recurrent episode, mild (HCC)  Generalized anxiety disorder  Attention and concentration deficit   _____________________ Arley Phenix, Psy.D. Clinical Neuropsychologist

## 2022-12-04 NOTE — Telephone Encounter (Signed)
Patient called and said that a new prescription of Emgality needs to be put in for 1 dose of 120 mg. Per month.

## 2022-12-06 ENCOUNTER — Other Ambulatory Visit: Payer: Self-pay

## 2022-12-06 ENCOUNTER — Encounter: Payer: Self-pay | Admitting: Obstetrics and Gynecology

## 2022-12-06 ENCOUNTER — Ambulatory Visit (INDEPENDENT_AMBULATORY_CARE_PROVIDER_SITE_OTHER): Payer: Medicaid Other | Admitting: Obstetrics and Gynecology

## 2022-12-06 VITALS — BP 108/67 | HR 89 | Wt 168.8 lb

## 2022-12-06 DIAGNOSIS — N920 Excessive and frequent menstruation with regular cycle: Secondary | ICD-10-CM

## 2022-12-06 DIAGNOSIS — G8929 Other chronic pain: Secondary | ICD-10-CM | POA: Diagnosis not present

## 2022-12-06 DIAGNOSIS — R102 Pelvic and perineal pain: Secondary | ICD-10-CM | POA: Diagnosis not present

## 2022-12-06 MED ORDER — NORETHINDRONE ACETATE 5 MG PO TABS
5.0000 mg | ORAL_TABLET | Freq: Every day | ORAL | 2 refills | Status: DC
Start: 2022-12-06 — End: 2023-01-31
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
## 2022-12-06 NOTE — Progress Notes (Signed)
Pt has elevated depression screening.  Pt declines [facility] as she has a psycharist that she is currently seeing.   Catherine Villarreal
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
## 2022-12-11 NOTE — Therapy (Unsigned)
OUTPATIENT PHYSICAL THERAPY FEMALE PELVIC EVALUATION   Patient Name: Catherine Villarreal MRN: [id number] DOB:September 18, 1990, 32 y.o., female Today's Date: 12/12/2022  END OF SESSION:  PT End of Session - 12/12/22 1308     Visit Number 2    Date for PT Re-Evaluation 06/12/23    Authorization Type Medicaid prepaid U[employer] - no auth required; 6 used in the past    PT Start Time 1300    PT Stop Time 1345    PT Time Calculation (min) 45 min    Activity Tolerance Patient tolerated treatment well    Behavior During Therapy WFL for tasks assessed/performed             Past Medical History:  Diagnosis Date   Anxiety and depression    Chronic headaches    Gallstones    Kidney stones    Migraine    Past Surgical History:  Procedure Laterality Date   CHOLECYSTECTOMY  2018   Patient Active Problem List   Diagnosis Date Noted   Migraine without aura and without status migrainosus, not intractable 10/26/2022   Cyst of pineal gland 04/04/2022   Elevated LFTs 03/14/2022   Intractable chronic paroxysmal hemicrania 03/14/2022   High triglycerides 03/14/2022   Gastroesophageal reflux disease with esophagitis without hemorrhage 12/28/2021   Pure hypertriglyceridemia 03/01/2021   Chronic hyperglycemia 03/01/2021   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) 01/10/2021   Recurrent nephrolithiasis 05/13/2020   Family history of breast cancer 01/30/2020   Presence of IUD 04/25/2018    PCP: Etta Grandchild, MD  REFERRING PROVIDER: Lorriane Shire, MD   REFERRING DIAG: R10.2,G89.29 (ICD-10-CM) - Chronic pelvic pain in female   THERAPY DIAG:  Other muscle spasm  Pelvic pain  Generalized abdominal pain  Other low back pain  Rationale for Evaluation and Treatment: Rehabilitation  ONSET DATE: 2017  SUBJECTIVE:                                                                                                                                                                                            SUBJECTIVE STATEMENT: Patient had her son in 2017 and issue increased. Patient was having heavy cramping and bleeding. Dry needling on back helped, stretching of stomach helped in the past.  Fluid intake: Yes: water and energy drink    PAIN:  Are you having pain? Yes NPRS scale: 7/10 Pain location:  lower abdominal, hips and back for 2 weeks while on her cycle  Pain type: locked up and stiff in back and hips and lower abdominal is cramping Pain description: constant   Aggravating factors: pressure on the abdomen, child's  pose Relieving factors: heating pad, hot pack, hot shower  PRECAUTIONS: None  RED FLAGS: None   WEIGHT BEARING RESTRICTIONS: No  FALLS:  Has patient fallen in last 6 months? No  LIVING ENVIRONMENT: Lives with: lives with their family   OCCUPATION: not at this time  PLOF: Independent  PATIENT GOALS: reducing pain   PERTINENT HISTORY:  Cholecystectomy 2018 Sexual abuse: No  BOWEL MOVEMENT: Pain with bowel movement: Yes; when having hard or firm stool Type of bowel movement:Type (Bristol Stool Scale) average day is type 4, could be type 2, Frequency daily, Strain Yes, and Splinting no Fully empty rectum: Yes:   Leakage: No Fiber supplement: No  URINATION: Pain with urination: Yes, if hold too long, first thing in the morning Fully empty bladder: Yes:   Stream: Strong Urgency: Yes:   Frequency: average in morning but frequently at night due to drinks most of water at night Leakage: Urge to void, Coughing, Sneezing, and jumping Pads: No  INTERCOURSE: Patient chooses not to have intercourse due to pain Pain with intercourse: Initial Penetration, During Penetration, After Intercourse, During Climax, and Pain Interrupts Intercourse Ability to have vaginal penetration:  Yes:   Climax: yes Marinoff Scale: 3/3  PREGNANCY: Vaginal deliveries 1   PROLAPSE: None   OBJECTIVE:  Note: Objective measures were completed at  Evaluation unless otherwise noted.  DIAGNOSTIC FINDINGS:  none    COGNITION: Overall cognitive status: Within functional limits for tasks assessed       POSTURE:  scoliosis   PELVIC ALIGNMENT: equal  LUMBARAROM/PROM:  A/PROM A/PROM  eval  Flexion Full and tight in lumbar  Extension Decreased by 25% and goes further on the left  Right rotation Decreased by 25%  Left rotation Decreased by 25%   (Blank rows = not tested)  LOWER EXTREMITY ROM: bilateral hip ROM is full   LOWER EXTREMITY MMT:  MMT Right eval Left eval  Hip abduction 3/5 3/5   PALPATION:   General  bulges lower abdomen when contracting, tenderness throughout the abdomen                External Perineal Exam intact, some erythremia along the vulvar area                             Internal Pelvic Floor tenderness located in the levator ani, obturator internist, sides of the bladder and the urethra  Patient confirms identification and approves PT to assess internal pelvic floor and treatment Yes  PELVIC MMT:   MMT eval  Vaginal 3/5 anterior and posterior, 2/5 sides and no hug of therapist finger  (Blank rows = not tested)        TONE: average  PROLAPSE: none  TODAY'S TREATMENT:  DATE: 12/12/22  EVAL see below   PATIENT EDUCATION:  Education details: educated patient on vaginal moisturizers and vaginal washes that would be appropriate to use on the vulva area and reduce irritation Person educated: Patient Education method: Explanation and Handouts Education comprehension: verbalized understanding and needs further education  HOME EXERCISE PROGRAM: See above.   ASSESSMENT:  CLINICAL IMPRESSION: Patient is a 32 y.o. female who was seen today for physical therapy evaluation and treatment for pelvic pain. Patient reports her pain in the pelvis, back, and hips is 7/10  especially when she is on her cycle. She will leak urine with  urge to void, coughing, sneezing, and jumping. Pelvic floor strength is 3/5 anteriorly and posteriorly and 2/5 laterally with no hug of therapist finger. She will strain at times to have a bowel movement. She has tenderness located in the abdomen, levator ani, obturator internist, sides of bladder and the urethra. She has limited lumbar ROM. She will bulge her lower abdomen when contracting. She will have pain with penile penetration vaginally and prefers to not engage due to the pain. Patient will benefit from skilled therapy to reduce her pain level and improve her quality of life addressing the above deficits.   OBJECTIVE IMPAIRMENTS: decreased coordination, decreased endurance, decreased ROM, decreased strength, increased fascial restrictions, increased muscle spasms, and pain.   ACTIVITY LIMITATIONS: continence, toileting, and locomotion level  PARTICIPATION LIMITATIONS: meal prep, cleaning, laundry, interpersonal relationship, and community activity  PERSONAL FACTORS: Age, Fitness, and Time since onset of injury/illness/exacerbation are also affecting patient's functional outcome.   REHAB POTENTIAL: Good  CLINICAL DECISION MAKING: Stable/uncomplicated  EVALUATION COMPLEXITY: Low   GOALS: Goals reviewed with patient? Yes  SHORT TERM GOALS: Target date: 01/09/23  Patient independent with initial HEP.  Baseline: not educated yet Goal status: INITIAL   LONG TERM GOALS: Target date: 06/12/23  Pt will be independent with advanced HEP to maintain improvements made throughout therapy  Baseline: Not educated yet Goal status: INITIAL  2.  Pt will be able to functional actions such as coughing or jumping without leakage or feeling as if it might happen due to increase in pelvic floor strength >/= 3/5.  Baseline:  Goal status: INITIAL  3.  Pt will report 50% less abdominal bloating and discomfort due to improved muscle tone  throughout the core and increased in tissue mobility.  Baseline:  Goal status: INITIAL  4.  Patient able to have vaginal penetration </= 1-2/10 due to reduction of trigger points and lengthening of tissue.  Baseline: pain level 7/10 Goal status: INITIAL  5.  Lumbar pain decreased </= 2-3/10 due ot improved mobility and tissue mobility so she is able to do more functional tasks with less pain.  Baseline: pain level 7/10 Goal status: INITIAL   PLAN:  PT FREQUENCY: 1x/week  PT DURATION: 6 months  PLANNED INTERVENTIONS: 97110-Therapeutic exercises, 97530- Therapeutic activity, [id number]- Neuromuscular re-education, 97140- Manual therapy, 97035- Ultrasound, Patient/Family education, Dry Needling, Joint mobilization, Spinal mobilization, Cryotherapy, Moist heat, and Biofeedback  PLAN FOR NEXT SESSION: manual work to abdomen and lower rib cage. Marjo Bicker pose with back breath, diaphragmatic breathing. meditation   Eulis Foster, PT

## 2022-12-12 ENCOUNTER — Encounter: Payer: Self-pay | Admitting: Physical Therapy

## 2022-12-12 ENCOUNTER — Other Ambulatory Visit: Payer: Self-pay

## 2022-12-12 ENCOUNTER — Encounter: Payer: Medicaid Other | Attending: Obstetrics and Gynecology | Admitting: Physical Therapy

## 2022-12-12 DIAGNOSIS — R102 Pelvic and perineal pain: Secondary | ICD-10-CM | POA: Insufficient documentation

## 2022-12-12 DIAGNOSIS — M62838 Other muscle spasm: Secondary | ICD-10-CM | POA: Diagnosis present

## 2022-12-12 DIAGNOSIS — M5459 Other low back pain: Secondary | ICD-10-CM | POA: Diagnosis present

## 2022-12-12 DIAGNOSIS — R1084 Generalized abdominal pain: Secondary | ICD-10-CM | POA: Diagnosis present

## 2022-12-12 DIAGNOSIS — G8929 Other chronic pain: Secondary | ICD-10-CM | POA: Insufficient documentation

## 2022-12-12 NOTE — Patient Instructions (Signed)
Moisturizers They are used in the vagina to hydrate the mucous membrane that make up the vaginal canal. Designed to keep a more normal acid balance (ph) Ingredients to avoid is glycerin and fragrance, can increase chance of infection    Creams to use externally on the Vulva area Marathon Oil (good for for cancer patients that had radiation to the area)- [place] or [email or web address] Vulva Balm/ V-magic cream by medicine mama- amazon Julva-amazon Vital "V Wild Yam salve ( help moisturize and help with thinning vulvar area, does have Beeswax MoodMaid Botanical Pro-Meno Wild Yam Cream- Amazon Desert Harvest Gele Cleo by Zane Herald labial moisturizer (Amazon),  Coconut or olive oil aloe Good Clean Love Enchanted Rose by intimate rose  Things to avoid in the vaginal area Do not use things to irritate the vulvar area No lotions just specialized creams for the vulva area- Neogyn, V-magic,  No soaps; can use Aveeno or Calendula cleanser, unscented Dove if needed. Must be gentle No deodorants No douches Good to sleep without underwear to let the vaginal area to air out No scrubbing: spread the lips to let warm water rinse over labias and pat dry   Eulis Foster, PT [facility] [address], [address] [place], [place] [address] W: [phone] [email or web address]

## 2022-12-13 ENCOUNTER — Telehealth: Payer: Self-pay

## 2022-12-13 NOTE — Telephone Encounter (Signed)
Patient called back to schedule surgery with Dr. Briscoe Deutscher at [facility] on 01/31/23 at 8:00 am, and confirmed arrival time is 6 am. Patient was provided pre-op instructions and surgery details over the phone. Patient is aware that all surgery details will also be sent to her MyChart acct.

## 2022-12-13 NOTE — Telephone Encounter (Signed)
Called patient to schedule surgery with Dr. Briscoe Deutscher on 01/31/23. Left a voicemail asking for a call back at [phone].

## 2022-12-15 ENCOUNTER — Encounter: Payer: Self-pay | Admitting: Psychiatry

## 2022-12-15 ENCOUNTER — Telehealth (INDEPENDENT_AMBULATORY_CARE_PROVIDER_SITE_OTHER): Payer: Medicaid Other | Admitting: Psychiatry

## 2022-12-15 DIAGNOSIS — F330 Major depressive disorder, recurrent, mild: Secondary | ICD-10-CM

## 2022-12-19 ENCOUNTER — Other Ambulatory Visit: Payer: Medicaid Other

## 2023-01-01 ENCOUNTER — Telehealth: Payer: Self-pay

## 2023-01-01 NOTE — Telephone Encounter (Addendum)
Catherine Villarreal  P [facility]: [address] [place] Good afternoon,  [place] consent form is required prior to surgery. Patient is scheduled w/ Dr. Briscoe Deutscher on 01/31/23.  Thanks, [name]  ----------------------------  Called pt to review. Pt plans to come into office this week to sign. Reviewed office hours.

## 2023-01-04 DIAGNOSIS — N200 Calculus of kidney: Secondary | ICD-10-CM | POA: Diagnosis not present

## 2023-01-05 ENCOUNTER — Encounter: Payer: Self-pay | Admitting: *Deleted

## 2023-01-09 ENCOUNTER — Telehealth: Payer: Self-pay | Admitting: Family Medicine

## 2023-01-09 NOTE — Telephone Encounter (Signed)
Patient has questions about the paperwork she signed for surgery and the surgery itself, would like a call back from a nurse

## 2023-01-09 NOTE — Telephone Encounter (Signed)
Called and spoke with patient. She was told to come in to sign consent for BTL. and she did.   Reviewed that BTL have to be signed at least 30 days prior to procedure or will not be covered. Reviewed surgical permits otherwise are signed closer to the actual procedure.   Patient voiced understanding.

## 2023-01-12 ENCOUNTER — Encounter: Payer: Self-pay | Admitting: Physical Therapy

## 2023-01-12 ENCOUNTER — Ambulatory Visit: Payer: Medicaid Other | Attending: Obstetrics and Gynecology | Admitting: Physical Therapy

## 2023-01-12 DIAGNOSIS — R1084 Generalized abdominal pain: Secondary | ICD-10-CM | POA: Diagnosis present

## 2023-01-12 DIAGNOSIS — M5459 Other low back pain: Secondary | ICD-10-CM | POA: Diagnosis present

## 2023-01-12 DIAGNOSIS — M62838 Other muscle spasm: Secondary | ICD-10-CM | POA: Diagnosis present

## 2023-01-12 DIAGNOSIS — R102 Pelvic and perineal pain: Secondary | ICD-10-CM | POA: Diagnosis present

## 2023-01-12 NOTE — Therapy (Addendum)
OUTPATIENT PHYSICAL THERAPY FEMALE PELVIC TREATMENT   Patient Name: Catherine Villarreal MRN: [id number] DOB:31-May-1990, 32 y.o., female Today's Date: 01/12/2023  END OF SESSION:  PT End of Session - 01/12/23 1018     Visit Number 2    Date for PT Re-Evaluation 06/12/23    Authorization Type Medicaid prepaid [employer] - no auth required; 6 used in the past    PT Start Time 1015    PT Stop Time 1055    PT Time Calculation (min) 40 min    Activity Tolerance Patient tolerated treatment well    Behavior During Therapy WFL for tasks assessed/performed             Past Medical History:  Diagnosis Date   Anxiety and depression    Chronic headaches    Gallstones    Kidney stones    Migraine    Past Surgical History:  Procedure Laterality Date   CHOLECYSTECTOMY  2018   Patient Active Problem List   Diagnosis Date Noted   Migraine without aura and without status migrainosus, not intractable 10/26/2022   Cyst of pineal gland 04/04/2022   Elevated LFTs 03/14/2022   Intractable chronic paroxysmal hemicrania 03/14/2022   High triglycerides 03/14/2022   Gastroesophageal reflux disease with esophagitis without hemorrhage 12/28/2021   Pure hypertriglyceridemia 03/01/2021   Chronic hyperglycemia 03/01/2021   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) 01/10/2021   Recurrent nephrolithiasis 05/13/2020   Family history of breast cancer 01/30/2020   Presence of IUD 04/25/2018    PCP: Etta Grandchild, MD  REFERRING PROVIDER: Lorriane Shire, MD   REFERRING DIAG: R10.2,G89.29 (ICD-10-CM) - Chronic pelvic pain in female   THERAPY DIAG:  Other muscle spasm  Pelvic pain  Generalized abdominal pain  Other low back pain  Rationale for Evaluation and Treatment: Rehabilitation  ONSET DATE: 2017  SUBJECTIVE:                                                                                                                                                                                            SUBJECTIVE STATEMENT: I am in a lot of pain all over. I am not bleeding but none stop body pain.  Fluid intake: Yes: water and energy drink    PAIN:  Are you having pain? Yes NPRS scale: 3/10 during the day and at night goes to 5/10 Pain location:  lower abdominal, hips and back for 2 weeks while on her cycle  Pain type: locked up and stiff in back and hips and lower abdominal is cramping Pain description: constant   Aggravating factors: pressure on the abdomen, child's pose  Relieving factors: heating pad, hot pack, hot shower  PRECAUTIONS: None  RED FLAGS: None   WEIGHT BEARING RESTRICTIONS: No  FALLS:  Has patient fallen in last 6 months? No  LIVING ENVIRONMENT: Lives with: lives with their family   OCCUPATION: not at this time  PLOF: Independent  PATIENT GOALS: reducing pain   PERTINENT HISTORY:  Cholecystectomy 2018 Sexual abuse: No  BOWEL MOVEMENT: Pain with bowel movement: Yes; when having hard or firm stool Type of bowel movement:Type (Bristol Stool Scale) average day is type 4, could be type 2, Frequency daily, Strain Yes, and Splinting no Fully empty rectum: Yes:   Leakage: No Fiber supplement: No  URINATION: Pain with urination: Yes, if hold too long, first thing in the morning Fully empty bladder: Yes:   Stream: Strong Urgency: Yes:   Frequency: average in morning but frequently at night due to drinks most of water at night Leakage: Urge to void, Coughing, Sneezing, and jumping Pads: No  INTERCOURSE: Patient chooses not to have intercourse due to pain Pain with intercourse: Initial Penetration, During Penetration, After Intercourse, During Climax, and Pain Interrupts Intercourse Ability to have vaginal penetration:  Yes:   Climax: yes Marinoff Scale: 3/3  PREGNANCY: Vaginal deliveries 1   PROLAPSE: None   OBJECTIVE:  Note: Objective measures were completed at Evaluation unless otherwise noted.  DIAGNOSTIC FINDINGS:   none    COGNITION: Overall cognitive status: Within functional limits for tasks assessed       POSTURE:  scoliosis   PELVIC ALIGNMENT: equal  LUMBARAROM/PROM:  A/PROM A/PROM  eval  Flexion Full and tight in lumbar  Extension Decreased by 25% and goes further on the left  Right rotation Decreased by 25%  Left rotation Decreased by 25%   (Blank rows = not tested)  LOWER EXTREMITY ROM: bilateral hip ROM is full   LOWER EXTREMITY MMT:  MMT Right eval Left eval  Hip abduction 3/5 3/5   PALPATION:   General  bulges lower abdomen when contracting, tenderness throughout the abdomen                External Perineal Exam intact, some erythremia along the vulvar area                             Internal Pelvic Floor tenderness located in the levator ani, obturator internist, sides of the bladder and the urethra  Patient confirms identification and approves PT to assess internal pelvic floor and treatment Yes  PELVIC MMT:   MMT eval  Vaginal 3/5 anterior and posterior, 2/5 sides and no hug of therapist finger  (Blank rows = not tested)        TONE: average  PROLAPSE: none  TODAY'S TREATMENT:     01/12/23 Manual: Soft tissue mobilization:  To assess for dry needling Manual work to the thoracic lumbar paraspinals and quadratus to elongate after dry needling Spinal mobilization: Side glide to T11-L3 grade 3 Trigger Point Dry-Needling  Treatment instructions: Expect mild to moderate muscle soreness. S/S of pneumothorax if dry needled over a lung field, and to seek immediate medical attention should they occur. Patient verbalized understanding of these instructions and education.  Patient Consent Given: Yes Education handout provided: Yes Muscles treated: bilateral lumbar and low thoracic multifidi and quadratus Electrical stimulation performed: No Parameters: N/A Treatment response/outcome: elongation of muscle and trigger point response  DATE: 12/12/22  EVAL see below   PATIENT EDUCATION:  Education details: educated patient on vaginal moisturizers and vaginal washes that would be appropriate to use on the vulva area and reduce irritation Person educated: Patient Education method: Explanation and Handouts Education comprehension: verbalized understanding and needs further education  HOME EXERCISE PROGRAM: See above.   ASSESSMENT:  CLINICAL IMPRESSION: Patient is a 32 y.o. female who was seen today for physical therapy  treatment for pelvic pain. Patient has been in increased pain for the past several days. Today in physical therapy focused on pain. She had multiple trigger point in the thoracic lumbar region. She has decreased pain after manual work to 1/10. Patient will be having hysterectomy on 01/31/23.  Patient will benefit from skilled therapy to reduce her pain level and improve her quality of life addressing the above deficits.   OBJECTIVE IMPAIRMENTS: decreased coordination, decreased endurance, decreased ROM, decreased strength, increased fascial restrictions, increased muscle spasms, and pain.   ACTIVITY LIMITATIONS: continence, toileting, and locomotion level  PARTICIPATION LIMITATIONS: meal prep, cleaning, laundry, interpersonal relationship, and community activity  PERSONAL FACTORS: Age, Fitness, and Time since onset of injury/illness/exacerbation are also affecting patient's functional outcome.   REHAB POTENTIAL: Good  CLINICAL DECISION MAKING: Stable/uncomplicated  EVALUATION COMPLEXITY: Low   GOALS: Goals reviewed with patient? Yes  SHORT TERM GOALS: Target date: 01/09/23  Patient independent with initial HEP.  Baseline: not educated yet Goal status: INITIAL   LONG TERM GOALS: Target date: 06/12/23  Pt will be independent with advanced HEP to maintain improvements made throughout  therapy  Baseline: Not educated yet Goal status: INITIAL  2.  Pt will be able to functional actions such as coughing or jumping without leakage or feeling as if it might happen due to increase in pelvic floor strength >/= 3/5.  Baseline:  Goal status: INITIAL  3.  Pt will report 50% less abdominal bloating and discomfort due to improved muscle tone throughout the core and increased in tissue mobility.  Baseline:  Goal status: INITIAL  4.  Patient able to have vaginal penetration </= 1-2/10 due to reduction of trigger points and lengthening of tissue.  Baseline: pain level 7/10 Goal status: INITIAL  5.  Lumbar pain decreased </= 2-3/10 due ot improved mobility and tissue mobility so she is able to do more functional tasks with less pain.  Baseline: pain level 7/10 Goal status: INITIAL   PLAN:  PT FREQUENCY: 1x/week  PT DURATION: 6 months  PLANNED INTERVENTIONS: 97110-Therapeutic exercises, 97530- Therapeutic activity, [id number]- Neuromuscular re-education, 97140- Manual therapy, 97035- Ultrasound, Patient/Family education, Dry Needling, Joint mobilization, Spinal mobilization, Cryotherapy, Moist heat, and Biofeedback  PLAN FOR NEXT SESSION: manual work to abdomen and lower rib cage. Marjo Bicker pose with back breath, diaphragmatic breathing. Meditation, see how dry needling went. If does not return prior to 12/11 then discharge.    Eulis Foster, PT   PHYSICAL THERAPY DISCHARGE SUMMARY  Visits from Start of Care: 2  Current functional level related to goals / functional outcomes: See above. Patient did not return for formal discharge.    Remaining deficits: See above.    Education / Equipment: HEP   Patient agrees to discharge. Patient goals were not met. Patient is being discharged due to not returning since the last visit.  Thank you for the referral.   Eulis Foster, PT 03/27/23 2:49 PM

## 2023-01-12 NOTE — Patient Instructions (Signed)

## 2023-01-24 ENCOUNTER — Encounter (HOSPITAL_BASED_OUTPATIENT_CLINIC_OR_DEPARTMENT_OTHER): Payer: Self-pay | Admitting: Obstetrics and Gynecology

## 2023-01-24 NOTE — Progress Notes (Signed)
Spoke w/ via phone for pre-op interview--- pt Lab needs dos---- urine preg        Lab results------ pt has lab appointment 01/25/2023 @ 1000 getting CBC/ T&S COVID test -----patient states asymptomatic no test needed Arrive at -------  0630 on 01-31-2023 NPO after MN NO Solid Food.  Clear liquids from MN until--- 0530 Med rec completed Medications to take morning of surgery ----- if needed take imitrex w/ sips of water Diabetic medication -----  n/a Patient instructed no nail polish to be worn day of surgery Patient instructed to bring photo id and insurance card day of surgery Patient aware to have Driver (ride ) / caregiver    for 24 hours after surgery - sig other, william Patient Special Instructions -----  pt will pick up bag w/ hibiclens soap and written instructions at lab appointment.   Pre-Op special Instructions ----- n/a Patient verbalized understanding of instructions that were given at this phone interview. Patient denies chest pain, sob, fever, cough at the interview.

## 2023-01-24 NOTE — Progress Notes (Signed)
Your procedure is scheduled on :  Wednesday,  01-31-2023  Report to [facility] AT  __6:30_ AM.   Call this number if you have problems the morning of surgery  :[phone]. Any questions prior to surgery call pre-op nurse , Carolyne Whitsel:  [phone]   OUR ADDRESS IS [address].  WE ARE LOCATED IN THE [facility] building.  PLEASE BRING YOUR INSURANCE CARD AND PHOTO ID DAY OF SURGERY.                                     REMEMBER:  Do not eat solid food after midnight night before surgery.  You may have clear liquid diet starting at midnight night before surgery until 5:30 AM.  No clear liquids after 5:30 AM day of surgery.  This includes water,  candy/ gum/ mints.  Please brush your teeth morning of surgery and rinse your mouth out.   CLEAR LIQUID DIET  Allowed      Water                                                                   Coffee and tea, regular and decaf  (NO cream or milk products of any type, may sweeten, no honey)                         Carbonated beverages, regular and diet                                    Sports drinks like Gatorade _____________________________________________________________________     TAKE ONLY THESE MEDICATIONS MORNING OF SURGERY:  My take sumatriptan (imitrex) if needed with sips of water                                        DO NOT WEAR JEWERLY/  METAL/  PIERCINGS (INCLUDING NO PLASTIC PIERCINGS) DO NOT WEAR LOTIONS, POWDERS, PERFUMES OR NAIL POLISH ON YOUR FINGERNAILS. TOENAIL POLISH IS OK TO WEAR. DO NOT SHAVE FOR 48 HOURS PRIOR TO DAY OF SURGERY.  CONTACTS, GLASSES, OR DENTURES MAY NOT BE WORN TO SURGERY.  REMEMBER: NO SMOKING, VAPING ,  DRUGS OR ALCOHOL FOR 24 HOURS BEFORE YOUR SURGERY.                                    [place] IS NOT RESPONSIBLE  FOR ANY BELONGINGS.                                                                    Marland Kitchen           [place] - Preparing  for  Surgery Before surgery, you can play an important role.  Because skin is not sterile, your skin needs to be as free of germs as possible.  You can reduce the number of germs on your skin by washing with CHG (chlorahexidine gluconate) soap before surgery.  CHG is an antiseptic cleaner which kills germs and bonds with the skin to continue killing germs even after washing. Please DO NOT use if you have an allergy to CHG or antibacterial soaps.  If your skin becomes reddened/irritated stop using the CHG and inform your nurse when you arrive at Short Stay. Do not shave (including legs and underarms) for at least 48 hours prior to the first CHG shower.  You may shave your face/neck. Please follow these instructions carefully:  1.  Shower with CHG Soap the night before surgery and the  morning of Surgery.  2.  If you choose to wash your hair, wash your hair first as usual with your  normal  shampoo.  3.  After you shampoo, rinse your hair and body thoroughly to remove the  shampoo.                                        4.  Use CHG as you would any other liquid soap.  You can apply chg directly  to the skin and wash , chg soap provided, night before and morning of your surgery.  5.  Apply the CHG Soap to your body ONLY FROM THE NECK DOWN.   Do not use on face/ open                           Wound or open sores. Avoid contact with eyes, ears mouth and genitals (private parts).                       Wash face,  Genitals (private parts) with your normal soap.             6.  Wash thoroughly, paying special attention to the area where your surgery  will be performed.  7.  Thoroughly rinse your body with warm water from the neck down.  8.  DO NOT shower/wash with your normal soap after using and rinsing off  the CHG Soap.             9.  Pat yourself dry with a clean towel.            10.  Wear clean pajamas.            11.  Place clean sheets on your bed the night of your first shower and do not  sleep with  pets. Day of Surgery : Do not apply any lotions/ powders the morning of surgery.  Please wear clean clothes to the hospital/surgery center.  IF YOU HAVE ANY SKIN IRRITATION OR PROBLEMS WITH THE SURGICAL SOAP, PLEASE GET A BAR OF GOLD DIAL SOAP AND SHOWER THE NIGHT BEFORE YOUR SURGERY AND THE MORNING OF YOUR SURGERY. PLEASE LET THE NURSE KNOW MORNING OF YOUR SURGERY IF YOU HAD ANY PROBLEMS WITH THE SURGICAL SOAP.   YOUR SURGEON MAY HAVE REQUESTED EXTENDED RECOVERY TIME AFTER YOUR SURGERY. IT COULD BE A  JUST A FEW HOURS  UP TO AN OVERNIGHT STAY.  YOUR SURGEON SHOULD HAVE DISCUSSED THIS WITH YOU  PRIOR TO YOUR SURGERY. IN THE EVENT YOU NEED TO STAY OVERNIGHT PLEASE REFER TO THE FOLLOWING GUIDELINES. YOU MAY HAVE UP TO 4 VISITORS  MAY VISIT IN THE EXTENDED RECOVERY ROOM UNTIL 800 PM ONLY.  ONE  VISITOR AGE 33 AND OVER MAY SPEND THE NIGHT AND MUST BE IN EXTENDED RECOVERY ROOM NO LATER THAN 800 PM . YOUR DISCHARGE TIME AFTER YOU SPEND THE NIGHT IS 900 AM THE MORNING AFTER YOUR SURGERY. YOU MAY PACK A SMALL OVERNIGHT BAG WITH TOILETRIES FOR YOUR OVERNIGHT STAY IF YOU WISH.  REGARDLESS OF IF YOU STAY OVER NIGHT OR ARE DISCHARGED THE SAME DAY YOU WILL BE REQUIRED TO HAVE A RESPONSIBLE ADULT (18 YRS OLD OR OLDER) STAY WITH YOU FOR AT LEAST THE FIRST 24 HOURS  YOUR PRESCRIPTION MEDICATIONS WILL BE PROVIDED DURING [facility] STAY.  ________________________________________________________________________

## 2023-01-25 ENCOUNTER — Encounter (HOSPITAL_COMMUNITY)
Admission: RE | Admit: 2023-01-25 | Discharge: 2023-01-25 | Disposition: A | Discharge status: Home / Self Care | Payer: Medicaid Other | Source: Ambulatory Visit | Attending: Obstetrics and Gynecology | Admitting: Obstetrics and Gynecology

## 2023-01-25 DIAGNOSIS — Z01812 Encounter for preprocedural laboratory examination: Secondary | ICD-10-CM | POA: Diagnosis present

## 2023-01-25 DIAGNOSIS — Z01818 Encounter for other preprocedural examination: Secondary | ICD-10-CM

## 2023-01-25 DIAGNOSIS — N939 Abnormal uterine and vaginal bleeding, unspecified: Secondary | ICD-10-CM | POA: Diagnosis not present

## 2023-01-25 LAB — CBC
HCT: 42.4 % (ref 36.0–46.0)
Hemoglobin: 14.5 g/dL (ref 12.0–15.0)
MCH: 30.6 pg (ref 26.0–34.0)
MCHC: 34.2 g/dL (ref 30.0–36.0)
MCV: 89.5 fL (ref 80.0–100.0)
Platelets: 320 10*3/uL (ref 150–400)
RBC: 4.74 MIL/uL (ref 3.87–5.11)
RDW: 12.5 % (ref 11.5–15.5)
WBC: 7.4 10*3/uL (ref 4.0–10.5)
nRBC: 0 % (ref 0.0–0.2)

## 2023-01-30 ENCOUNTER — Encounter: Payer: Self-pay | Admitting: *Deleted

## 2023-01-30 ENCOUNTER — Telehealth: Payer: Self-pay | Admitting: *Deleted

## 2023-01-30 NOTE — Telephone Encounter (Signed)
I called Catherine Villarreal and explained I needed her to sign another consent, the consent she signed today she left out the M for her middle name. She agreed to come back. Nancy Fetter

## 2023-01-30 NOTE — Telephone Encounter (Signed)
I called Catherine Villarreal and notified her we have been notified she needs to come in today to sign a Hysterectomy statement or her surgery for tomorrow will be cancelled. I aplogized this was not done sooner. She states she will come in today but doesn't understand why she wasn't told to do this sooner. I apologized again.  Nancy Fetter

## 2023-01-30 NOTE — Anesthesia Preprocedure Evaluation (Signed)
Anesthesia Evaluation  Patient identified by MRN, date of birth, ID band Patient awake    Reviewed: Allergy & Precautions, NPO status , Patient's Chart, lab work & pertinent test results  Airway Mallampati: II  TM Distance: >3 FB Neck ROM: Full    Dental  (+) Dental Advisory Given, Chipped   Pulmonary Patient abstained from smoking., former smoker   Pulmonary exam normal breath sounds clear to auscultation       Cardiovascular negative cardio ROS Normal cardiovascular exam Rhythm:Regular Rate:Normal     Neuro/Psych  Headaches PSYCHIATRIC DISORDERS Anxiety Depression       GI/Hepatic Neg liver ROS,GERD  ,,  Endo/Other  negative endocrine ROS    Renal/GU negative Renal ROS     Musculoskeletal negative musculoskeletal ROS (+)    Abdominal   Peds  Hematology negative hematology ROS (+)   Anesthesia Other Findings   Reproductive/Obstetrics negative OB ROS                             Anesthesia Physical Anesthesia Plan  ASA: 2  Anesthesia Plan: General   Post-op Pain Management: Tylenol PO (pre-op)* and Gabapentin PO (pre-op)*   Induction: Intravenous  PONV Risk Score and Plan: 4 or greater and Ondansetron, Dexamethasone, Treatment may vary due to age or medical condition, Midazolam, Scopolamine patch - Pre-op, TIVA and Propofol infusion  Airway Management Planned: Oral ETT  Additional Equipment:   Intra-op Plan:   Post-operative Plan: Extubation in OR  Informed Consent: I have reviewed the patients History and Physical, chart, labs and discussed the procedure including the risks, benefits and alternatives for the proposed anesthesia with the patient or authorized representative who has indicated his/her understanding and acceptance.     Dental advisory given  Plan Discussed with: CRNA  Anesthesia Plan Comments:        Anesthesia Quick Evaluation

## 2023-01-31 ENCOUNTER — Other Ambulatory Visit: Payer: Self-pay

## 2023-01-31 ENCOUNTER — Encounter (HOSPITAL_BASED_OUTPATIENT_CLINIC_OR_DEPARTMENT_OTHER)
Admission: RE | Disposition: A | Discharge status: Home / Self Care | Payer: Self-pay | Source: Ambulatory Visit | Attending: Obstetrics and Gynecology

## 2023-01-31 ENCOUNTER — Telehealth: Payer: Self-pay | Admitting: Obstetrics and Gynecology

## 2023-01-31 ENCOUNTER — Encounter (HOSPITAL_BASED_OUTPATIENT_CLINIC_OR_DEPARTMENT_OTHER): Payer: Self-pay | Admitting: Obstetrics and Gynecology

## 2023-01-31 ENCOUNTER — Ambulatory Visit (HOSPITAL_BASED_OUTPATIENT_CLINIC_OR_DEPARTMENT_OTHER): Payer: Self-pay | Admitting: Anesthesiology

## 2023-01-31 ENCOUNTER — Ambulatory Visit (HOSPITAL_BASED_OUTPATIENT_CLINIC_OR_DEPARTMENT_OTHER)
Admission: RE | Admit: 2023-01-31 | Discharge: 2023-01-31 | Disposition: A | Discharge status: Home / Self Care | Payer: Medicaid Other | Source: Ambulatory Visit | Attending: Obstetrics and Gynecology | Admitting: Obstetrics and Gynecology

## 2023-01-31 DIAGNOSIS — N939 Abnormal uterine and vaginal bleeding, unspecified: Secondary | ICD-10-CM | POA: Diagnosis present

## 2023-01-31 DIAGNOSIS — F32A Depression, unspecified: Secondary | ICD-10-CM | POA: Diagnosis not present

## 2023-01-31 DIAGNOSIS — K219 Gastro-esophageal reflux disease without esophagitis: Secondary | ICD-10-CM | POA: Insufficient documentation

## 2023-01-31 DIAGNOSIS — G8929 Other chronic pain: Secondary | ICD-10-CM | POA: Insufficient documentation

## 2023-01-31 DIAGNOSIS — N938 Other specified abnormal uterine and vaginal bleeding: Secondary | ICD-10-CM | POA: Diagnosis not present

## 2023-01-31 DIAGNOSIS — F419 Anxiety disorder, unspecified: Secondary | ICD-10-CM | POA: Insufficient documentation

## 2023-01-31 DIAGNOSIS — R519 Headache, unspecified: Secondary | ICD-10-CM | POA: Insufficient documentation

## 2023-01-31 DIAGNOSIS — N8003 Adenomyosis of the uterus: Secondary | ICD-10-CM | POA: Insufficient documentation

## 2023-01-31 DIAGNOSIS — R102 Pelvic and perineal pain unspecified side: Secondary | ICD-10-CM

## 2023-01-31 DIAGNOSIS — Z793 Long term (current) use of hormonal contraceptives: Secondary | ICD-10-CM | POA: Insufficient documentation

## 2023-01-31 DIAGNOSIS — Z79899 Other long term (current) drug therapy: Secondary | ICD-10-CM | POA: Insufficient documentation

## 2023-01-31 DIAGNOSIS — Z01818 Encounter for other preprocedural examination: Secondary | ICD-10-CM

## 2023-01-31 DIAGNOSIS — Z87891 Personal history of nicotine dependence: Secondary | ICD-10-CM | POA: Insufficient documentation

## 2023-01-31 DIAGNOSIS — N858 Other specified noninflammatory disorders of uterus: Secondary | ICD-10-CM | POA: Diagnosis not present

## 2023-01-31 HISTORY — PX: TOTAL LAPAROSCOPIC HYSTERECTOMY WITH SALPINGECTOMY: SHX6742

## 2023-01-31 HISTORY — DX: Other chronic pain: G89.29

## 2023-01-31 HISTORY — PX: CYSTOSCOPY: SHX5120

## 2023-01-31 HISTORY — DX: Gastro-esophageal reflux disease without esophagitis: K21.9

## 2023-01-31 HISTORY — DX: Major depressive disorder, single episode, unspecified: F32.9

## 2023-01-31 HISTORY — DX: Generalized anxiety disorder: F41.1

## 2023-01-31 HISTORY — DX: Migraine, unspecified, not intractable, without status migrainosus: G43.909

## 2023-01-31 HISTORY — DX: Calculus of kidney: N20.0

## 2023-01-31 HISTORY — DX: Abnormal uterine and vaginal bleeding, unspecified: N93.9

## 2023-01-31 LAB — ABO/RH: ABO/RH(D): O POS

## 2023-01-31 LAB — TYPE AND SCREEN
ABO/RH(D): O POS
Antibody Screen: NEGATIVE

## 2023-01-31 LAB — POCT PREGNANCY, URINE: Preg Test, Ur: NEGATIVE

## 2023-01-31 SURGERY — HYSTERECTOMY, TOTAL, LAPAROSCOPIC, WITH SALPINGECTOMY
Anesthesia: General | Site: Bladder

## 2023-01-31 MED ORDER — ONDANSETRON HCL 4 MG/2ML IJ SOLN: 4.0000 mg | Freq: Four times a day (QID) | INTRAMUSCULAR | Status: DC | PRN

## 2023-01-31 MED ORDER — ONDANSETRON HCL 4 MG/2ML IJ SOLN
INTRAMUSCULAR | Status: DC | PRN
  Administered 2023-01-31: 4 mg via INTRAVENOUS

## 2023-01-31 MED ORDER — MIDAZOLAM HCL 2 MG/2ML IJ SOLN
INTRAMUSCULAR | Status: DC | PRN
  Administered 2023-01-31: 2 mg via INTRAVENOUS

## 2023-01-31 MED ORDER — OXYCODONE HCL 5 MG PO TABS
ORAL_TABLET | ORAL | Status: AC
  Filled 2023-01-31: qty 2

## 2023-01-31 MED ORDER — LIDOCAINE HCL (PF) 2 % IJ SOLN
INTRAMUSCULAR | Status: AC
  Filled 2023-01-31: qty 5

## 2023-01-31 MED ORDER — SIMETHICONE 80 MG PO CHEW: 80.0000 mg | CHEWABLE_TABLET | Freq: Four times a day (QID) | ORAL | 0 refills | Status: DC | PRN

## 2023-01-31 MED ORDER — SCOPOLAMINE 1 MG/3DAYS TD PT72
1.0000 | MEDICATED_PATCH | TRANSDERMAL | Status: DC
  Administered 2023-01-31: 1.5 mg via TRANSDERMAL

## 2023-01-31 MED ORDER — PROPOFOL 500 MG/50ML IV EMUL
INTRAVENOUS | Status: DC | PRN
  Administered 2023-01-31 (×2): 150 ug/kg/min via INTRAVENOUS

## 2023-01-31 MED ORDER — BUPIVACAINE HCL (PF) 0.5 % IJ SOLN
INTRAMUSCULAR | Status: DC | PRN
  Administered 2023-01-31: 30 mL

## 2023-01-31 MED ORDER — GABAPENTIN 300 MG PO CAPS
ORAL_CAPSULE | ORAL | Status: AC
Start: 2023-01-31 — End: ?
  Filled 2023-01-31: qty 1

## 2023-01-31 MED ORDER — LIDOCAINE 2% (20 MG/ML) 5 ML SYRINGE
INTRAMUSCULAR | Status: DC | PRN
  Administered 2023-01-31: 70 mg via INTRAVENOUS

## 2023-01-31 MED ORDER — FENTANYL CITRATE (PF) 100 MCG/2ML IJ SOLN
25.0000 ug | INTRAMUSCULAR | Status: DC | PRN
  Administered 2023-01-31 (×3): 50 ug via INTRAVENOUS

## 2023-01-31 MED ORDER — PROPOFOL 10 MG/ML IV BOLUS
INTRAVENOUS | Status: AC
  Filled 2023-01-31: qty 20

## 2023-01-31 MED ORDER — FENTANYL CITRATE (PF) 100 MCG/2ML IJ SOLN
INTRAMUSCULAR | Status: AC
  Filled 2023-01-31: qty 2

## 2023-01-31 MED ORDER — FENTANYL CITRATE (PF) 250 MCG/5ML IJ SOLN
INTRAMUSCULAR | Status: DC | PRN
  Administered 2023-01-31: 100 ug via INTRAVENOUS

## 2023-01-31 MED ORDER — ONDANSETRON HCL 4 MG PO TABS: 4.0000 mg | ORAL_TABLET | Freq: Four times a day (QID) | ORAL | Status: DC | PRN

## 2023-01-31 MED ORDER — ONDANSETRON HCL 4 MG/2ML IJ SOLN
INTRAMUSCULAR | Status: AC
Start: 2023-01-31 — End: ?
  Filled 2023-01-31: qty 2

## 2023-01-31 MED ORDER — CEFAZOLIN SODIUM-DEXTROSE 2-4 GM/100ML-% IV SOLN
2.0000 g | INTRAVENOUS | Status: AC
  Administered 2023-01-31: 2 g via INTRAVENOUS

## 2023-01-31 MED ORDER — OXYCODONE HCL 5 MG/5ML PO SOLN: 5.0000 mg | Freq: Once | ORAL | Status: DC | PRN

## 2023-01-31 MED ORDER — CEFAZOLIN SODIUM-DEXTROSE 2-4 GM/100ML-% IV SOLN
INTRAVENOUS | Status: AC
  Filled 2023-01-31: qty 100

## 2023-01-31 MED ORDER — IBUPROFEN 800 MG PO TABS: 800.0000 mg | ORAL_TABLET | Freq: Three times a day (TID) | ORAL | 1 refills | Status: DC | PRN

## 2023-01-31 MED ORDER — KETOROLAC TROMETHAMINE 30 MG/ML IJ SOLN
INTRAMUSCULAR | Status: AC
Start: 2023-01-31 — End: ?
  Filled 2023-01-31: qty 1

## 2023-01-31 MED ORDER — DEXAMETHASONE SODIUM PHOSPHATE 10 MG/ML IJ SOLN
INTRAMUSCULAR | Status: DC | PRN
  Administered 2023-01-31: 10 mg via INTRAVENOUS

## 2023-01-31 MED ORDER — SCOPOLAMINE 1 MG/3DAYS TD PT72
MEDICATED_PATCH | TRANSDERMAL | Status: AC
  Filled 2023-01-31: qty 1

## 2023-01-31 MED ORDER — PROPOFOL 1000 MG/100ML IV EMUL
INTRAVENOUS | Status: AC
  Filled 2023-01-31: qty 100

## 2023-01-31 MED ORDER — PHENYLEPHRINE HCL (PRESSORS) 10 MG/ML IV SOLN
INTRAVENOUS | Status: AC
  Filled 2023-01-31: qty 1

## 2023-01-31 MED ORDER — LACTATED RINGERS IV SOLN: INTRAVENOUS | Status: DC

## 2023-01-31 MED ORDER — OXYCODONE HCL 5 MG PO TABS: 5.0000 mg | ORAL_TABLET | Freq: Once | ORAL | Status: DC | PRN

## 2023-01-31 MED ORDER — ACETAMINOPHEN 500 MG PO TABS
1000.0000 mg | ORAL_TABLET | ORAL | Status: AC
  Administered 2023-01-31: 1000 mg via ORAL

## 2023-01-31 MED ORDER — POLYETHYLENE GLYCOL 3350 17 G PO PACK: 17.0000 g | PACK | Freq: Every day | ORAL | 0 refills | Status: DC | PRN

## 2023-01-31 MED ORDER — DEXTROSE-SODIUM CHLORIDE 5-0.45 % IV SOLN: INTRAVENOUS | Status: DC

## 2023-01-31 MED ORDER — GABAPENTIN 300 MG PO CAPS
300.0000 mg | ORAL_CAPSULE | ORAL | Status: AC
  Administered 2023-01-31: 300 mg via ORAL

## 2023-01-31 MED ORDER — SIMETHICONE 80 MG PO CHEW: 80.0000 mg | CHEWABLE_TABLET | Freq: Four times a day (QID) | ORAL | Status: DC | PRN

## 2023-01-31 MED ORDER — FLUORESCEIN SODIUM 10 % IV SOLN
INTRAVENOUS | Status: AC
  Filled 2023-01-31: qty 5

## 2023-01-31 MED ORDER — IBUPROFEN 200 MG PO TABS: 600.0000 mg | ORAL_TABLET | Freq: Four times a day (QID) | ORAL | Status: DC

## 2023-01-31 MED ORDER — OXYCODONE HCL 5 MG PO TABS: 5.0000 mg | ORAL_TABLET | ORAL | 0 refills | Status: DC | PRN

## 2023-01-31 MED ORDER — PROPOFOL 10 MG/ML IV BOLUS
INTRAVENOUS | Status: DC | PRN
  Administered 2023-01-31: 200 mg via INTRAVENOUS

## 2023-01-31 MED ORDER — PHENYLEPHRINE HCL (PRESSORS) 10 MG/ML IV SOLN
INTRAVENOUS | Status: AC
Start: 2023-01-31 — End: ?
  Filled 2023-01-31: qty 1

## 2023-01-31 MED ORDER — SUGAMMADEX SODIUM 200 MG/2ML IV SOLN
INTRAVENOUS | Status: DC | PRN
  Administered 2023-01-31: 200 mg via INTRAVENOUS

## 2023-01-31 MED ORDER — OXYCODONE HCL 5 MG PO TABS
5.0000 mg | ORAL_TABLET | ORAL | Status: DC | PRN
  Administered 2023-01-31: 10 mg via ORAL

## 2023-01-31 MED ORDER — POVIDONE-IODINE 10 % EX SWAB
2.0000 | Freq: Once | CUTANEOUS | Status: AC
  Administered 2023-01-31: 2 via TOPICAL

## 2023-01-31 MED ORDER — FLUORESCEIN SODIUM 10 % IV SOLN
INTRAVENOUS | Status: DC | PRN
  Administered 2023-01-31: 25 mg via INTRAVENOUS

## 2023-01-31 MED ORDER — KETOROLAC TROMETHAMINE 30 MG/ML IJ SOLN: 30.0000 mg | Freq: Four times a day (QID) | INTRAMUSCULAR | Status: DC

## 2023-01-31 MED ORDER — POLYETHYLENE GLYCOL 3350 17 G PO PACK: 17.0000 g | PACK | Freq: Every day | ORAL | Status: DC | PRN

## 2023-01-31 MED ORDER — ACETAMINOPHEN 500 MG PO TABS: 1000.0000 mg | ORAL_TABLET | Freq: Four times a day (QID) | ORAL | 1 refills | Status: DC

## 2023-01-31 MED ORDER — ACETAMINOPHEN 500 MG PO TABS
ORAL_TABLET | ORAL | Status: AC
  Filled 2023-01-31: qty 2

## 2023-01-31 MED ORDER — MEPERIDINE HCL 25 MG/ML IJ SOLN: 6.2500 mg | INTRAMUSCULAR | Status: DC | PRN

## 2023-01-31 MED ORDER — MIDAZOLAM HCL 2 MG/2ML IJ SOLN
INTRAMUSCULAR | Status: AC
  Filled 2023-01-31: qty 2

## 2023-01-31 MED ORDER — ACETAMINOPHEN 500 MG PO TABS
1000.0000 mg | ORAL_TABLET | Freq: Four times a day (QID) | ORAL | Status: DC
  Administered 2023-01-31: 1000 mg via ORAL

## 2023-01-31 MED ORDER — SODIUM CHLORIDE 0.9 % IR SOLN
Status: DC | PRN
  Administered 2023-01-31: 1000 mL

## 2023-01-31 MED ORDER — SODIUM CHLORIDE 0.9 % IV SOLN: INTRAVENOUS | Status: DC

## 2023-01-31 MED ORDER — ROCURONIUM BROMIDE 10 MG/ML (PF) SYRINGE
PREFILLED_SYRINGE | INTRAVENOUS | Status: AC
  Filled 2023-01-31: qty 10

## 2023-01-31 MED ORDER — MORPHINE SULFATE (PF) 4 MG/ML IV SOLN: 1.0000 mg | INTRAVENOUS | Status: DC | PRN

## 2023-01-31 MED ORDER — DEXAMETHASONE SODIUM PHOSPHATE 10 MG/ML IJ SOLN
INTRAMUSCULAR | Status: AC
Start: 2023-01-31 — End: ?
  Filled 2023-01-31: qty 1

## 2023-01-31 MED ORDER — DROPERIDOL 2.5 MG/ML IJ SOLN: 0.6250 mg | Freq: Once | INTRAMUSCULAR | Status: DC | PRN

## 2023-01-31 MED ORDER — KETOROLAC TROMETHAMINE 30 MG/ML IJ SOLN
INTRAMUSCULAR | Status: DC | PRN
  Administered 2023-01-31: 30 mg via INTRAVENOUS

## 2023-01-31 MED ORDER — ROCURONIUM BROMIDE 10 MG/ML (PF) SYRINGE
PREFILLED_SYRINGE | INTRAVENOUS | Status: DC | PRN
  Administered 2023-01-31: 70 mg via INTRAVENOUS

## 2023-01-31 SURGICAL SUPPLY — 57 items
APPLICATOR ARISTA FLEXITIP XL (MISCELLANEOUS) IMPLANT
BLADE SURG 10 STRL SS (BLADE) IMPLANT
CABLE HIGH FREQUENCY MONO STRZ (ELECTRODE) IMPLANT
CHLORAPREP W/TINT 26 (MISCELLANEOUS) ×3 IMPLANT
COVER BACK TABLE 60X90IN (DRAPES) ×3 IMPLANT
COVER MAYO STAND STRL (DRAPES) ×3 IMPLANT
DERMABOND ADVANCED .7 DNX12 (GAUZE/BANDAGES/DRESSINGS) ×3 IMPLANT
DILATOR CANAL MILEX (MISCELLANEOUS) IMPLANT
DRAPE SURG IRRIG POUCH 19X23 (DRAPES) ×3 IMPLANT
GAUZE 4X4 16PLY ~~LOC~~+RFID DBL (SPONGE) ×3 IMPLANT
GLOVE BIO SURGEON STRL SZ7 (GLOVE) ×6 IMPLANT
GLOVE BIOGEL PI IND STRL 7.0 (GLOVE) ×6 IMPLANT
GOWN STRL REUS W/ TWL XL LVL3 (GOWN DISPOSABLE) ×6 IMPLANT
HARMONIC RUM II 2.5CM SILVER (DISPOSABLE)
HARMONIC RUM II 3.0CM SILVER (DISPOSABLE) ×3
HARMONIC RUM II 3.5CM SILVER (DISPOSABLE)
HARMONIC RUM II 4.0CM SILVER (DISPOSABLE)
HEMOSTAT ARISTA ABSORB 3G PWDR (HEMOSTASIS) IMPLANT
IRRIG SUCT STRYKERFLOW 2 WTIP (MISCELLANEOUS) ×3
IRRIGATION SUCT STRKRFLW 2 WTP (MISCELLANEOUS) ×3 IMPLANT
KIT PINK PAD W/HEAD ARE REST (MISCELLANEOUS) ×3
KIT PINK PAD W/HEAD ARM REST (MISCELLANEOUS) ×3 IMPLANT
KIT TURNOVER CYSTO (KITS) ×3 IMPLANT
LIGASURE VESSEL 5MM BLUNT TIP (ELECTROSURGICAL) IMPLANT
NDL SPNL 22GX3.5 QUINCKE BK (NEEDLE) ×3 IMPLANT
NEEDLE SPNL 22GX3.5 QUINCKE BK (NEEDLE) ×3 IMPLANT
NS IRRIG 1000ML POUR BTL (IV SOLUTION) ×3 IMPLANT
OCCLUDER COLPOPNEUMO (BALLOONS) IMPLANT
PACK LAPAROSCOPY BASIN (CUSTOM PROCEDURE TRAY) ×3 IMPLANT
POUCH LAPAROSCOPIC INSTRUMENT (MISCELLANEOUS) ×3 IMPLANT
PROTECTOR NERVE ULNAR (MISCELLANEOUS) ×3 IMPLANT
SCALPEL HRMNC RUM II 2.5 SILVR (DISPOSABLE) IMPLANT
SCALPEL HRMNC RUM II 3.0 SILVR (DISPOSABLE) IMPLANT
SCALPEL HRMNC RUM II 3.5 SILVR (DISPOSABLE) IMPLANT
SCALPEL HRMNC RUM II 4.0 SILVR (DISPOSABLE) IMPLANT
SCISSORS LAP 5X35 DISP (ENDOMECHANICALS) IMPLANT
SET IRRIG Y TYPE TUR BLADDER L (SET/KITS/TRAYS/PACK) ×3 IMPLANT
SET TRI-LUMEN FLTR TB AIRSEAL (TUBING) ×3 IMPLANT
SHEARS HARMONIC 36 ACE (MISCELLANEOUS) ×3 IMPLANT
SLEEVE SCD COMPRESS KNEE MED (STOCKING) ×3 IMPLANT
SLEEVE Z-THREAD 5X100MM (TROCAR) ×6 IMPLANT
SUT VIC AB 0 CT1 27XBRD ANBCTR (SUTURE) IMPLANT
SUT VIC AB 4-0 PS2 18 (SUTURE) ×3 IMPLANT
SUT VICRYL 0 UR6 27IN ABS (SUTURE) IMPLANT
SUT VLOC 180 0 9IN GS21 (SUTURE) ×3 IMPLANT
SYR 10ML LL (SYRINGE) ×3 IMPLANT
SYR 50ML LL SCALE MARK (SYRINGE) ×6 IMPLANT
SYR CONTROL 10ML LL (SYRINGE) ×3 IMPLANT
TIP UTERINE 5.1X6CM LAV DISP (MISCELLANEOUS) IMPLANT
TIP UTERINE 6.7X10CM GRN DISP (MISCELLANEOUS) IMPLANT
TIP UTERINE 6.7X6CM WHT DISP (MISCELLANEOUS) IMPLANT
TIP UTERINE 6.7X8CM BLUE DISP (MISCELLANEOUS) IMPLANT
TOWEL OR 17X24 6PK STRL BLUE (TOWEL DISPOSABLE) ×3 IMPLANT
TRAY FOLEY W/BAG SLVR 14FR LF (SET/KITS/TRAYS/PACK) ×3 IMPLANT
TROCAR PORT AIRSEAL 8X120 (TROCAR) ×3 IMPLANT
TROCAR Z-THREAD FIOS 5X100MM (TROCAR) ×3 IMPLANT
WARMER LAPAROSCOPE (MISCELLANEOUS) ×3 IMPLANT

## 2023-01-31 NOTE — Anesthesia Procedure Notes (Signed)
Procedure Name: Intubation Date/Time: 01/31/2023 8:40 AM  Performed by: Carlos American, CRNAPre-anesthesia Checklist: Patient identified, Emergency Drugs available, Suction available and Patient being monitored Patient Re-evaluated:Patient Re-evaluated prior to induction Oxygen Delivery Method: Circle System Utilized Preoxygenation: Pre-oxygenation with 100% oxygen Induction Type: IV induction Ventilation: Mask ventilation without difficulty Laryngoscope Size: Mac and 3 Grade View: Grade I Tube type: Oral Tube size: 7.0 mm Number of attempts: 1 Airway Equipment and Method: Stylet Placement Confirmation: ETT inserted through vocal cords under direct vision, positive ETCO2 and breath sounds checked- equal and bilateral Secured at: 20 cm Tube secured with: Tape Dental Injury: Teeth and Oropharynx as per pre-operative assessment

## 2023-01-31 NOTE — Brief Op Note (Signed)
01/31/2023  10:20 AM  PATIENT:  Catherine Villarreal  32 y.o. female  PRE-OPERATIVE DIAGNOSIS:  Abnormal uterine bleeding, Pelvic pain  POST-OPERATIVE DIAGNOSIS:  Abnormal uterine bleeding, Pelvic pain  PROCEDURE:  Procedure(s): TOTAL LAPAROSCOPIC HYSTERECTOMY WITH SALPINGECTOMY (Bilateral) LAPAROSCOPIC EXCISION OF ENDOMETRIOSIS (N/A) CYSTOSCOPY (N/A)  SURGEON:  Surgeons and Role:    Lorriane Shire, MD - Primary    * [place] Bing, MD - Assisting  PHYSICIAN ASSISTANT: Clement Sayres  ASSISTANTS: above   ANESTHESIA:   local and general  EBL:  20 mL   BLOOD ADMINISTERED:none  DRAINS: none   LOCAL MEDICATIONS USED:  BUPIVICAINE   SPECIMEN:  Source of Specimen:  uterus, cervix, bilateral fallopian tubes  DISPOSITION OF SPECIMEN:  PATHOLOGY  COUNTS:  YES  TOURNIQUET:  * No tourniquets in log *  DICTATION: .Note written in EPIC  PLAN OF CARE:  extended recovery  PATIENT DISPOSITION:  PACU - hemodynamically stable.   Delay start of Pharmacological VTE agent (>24hrs) due to surgical blood loss or risk of bleeding: not applicable

## 2023-01-31 NOTE — Discharge Instructions (Signed)

## 2023-01-31 NOTE — H&P (Signed)
OB/GYN Pre-Op History and Physical  Catherine Villarreal is a 32 y.o. G2P1011 presenting for TLH, BS, cysto.       Past Medical History:  Diagnosis Date   Abnormal uterine bleeding (AUB)    Chronic pelvic pain in female    GAD (generalized anxiety disorder)    GERD (gastroesophageal reflux disease)    Left nephrolithiasis    urologist--- dr r.  davis;   last ultrasound in care everywhere 01-04-2023 .  nonobstructive   MDD (major depressive disorder)    Migraines     Past Surgical History:  Procedure Laterality Date   LAPAROSCOPIC CHOLECYSTECTOMY  2017   dr gross    OB History  Gravida Para Term Preterm AB Living  2 1 1  0 1 1  SAB IAB Ectopic Multiple Live Births  1 0 0 0 1    # Outcome Date GA Lbr Len/2nd Weight Sex Type Anes PTL Lv  2 Term 08/29/15 [redacted]w[redacted]d 11:03 / 02:12 3565 g M Vag-Spont EPI  LIV  1 SAB             Social History   Socioeconomic History   Marital status: Single    Spouse name: [name]   Number of children: 1   Years of education: Not on file   Highest education level: Some college, no degree  Occupational History    Comment: na  Tobacco Use   Smoking status: Former    Current packs/day: 0.75    Types: Cigarettes   Smokeless tobacco: Never   Tobacco comments:    01-25-2023  pt stated quit smoking 2022,  started 2009  Vaping Use   Vaping status: Every Day   Substances: Nicotine, Flavoring, Nicotine-salt   Devices: geek bar  Substance and Sexual Activity   Alcohol use: Not Currently    Comment: very rare   Drug use: Not Currently   Sexual activity: Yes    Partners: Male    Birth control/protection: Pill  Other Topics Concern   Not on file  Social History Narrative   Lives with  child, partner,mother in [place].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   Caffeine- coffee, energy drink daily.   Currently engaged and lives with her partner.     Social Determinants of Health   Financial Resource Strain: Not on file  Food Insecurity: No  Food Insecurity (11/06/2022)   Hunger Vital Sign    Worried About Running Out of Food in the Last Year: Never true    Ran Out of Food in the Last Year: Never true  Transportation Needs: No Transportation Needs (11/06/2022)   PRAPARE - Administrator, Civil Service (Medical): No    Lack of Transportation (Non-Medical): No  Physical Activity: Not on file  Stress: Not on file  Social Connections: Not on file    Family History  Problem Relation Age of Onset   Cancer Mother    Breast cancer Mother        pt states diagnosed at age 62/40   Thyroid cancer Mother    Other Mother    Thyroid disease Mother    Luiz Blare' disease Mother    Stroke Father    Thyroid disease Maternal Aunt    Thyroid disease Maternal Grandmother    Drug abuse Brother     Medications Prior to Admission  Medication Sig Dispense Refill Last Dose   esomeprazole (NEXIUM) 40 MG capsule TAKE 1 CAPSULE(40 MG) BY MOUTH DAILY (Patient taking differently: Take 40 mg  by mouth at bedtime.) 90 capsule 0 Past Week   norethindrone (AYGESTIN) 5 MG tablet Take 1 tablet (5 mg total) by mouth daily. (Patient taking differently: Take 5 mg by mouth at bedtime.) 30 tablet 2 01/30/2023   SUMAtriptan (IMITREX) 50 MG tablet Take 1 tablet (50 mg total) by mouth every 2 (two) hours as needed for up to 23 days for migraine (up to 200 mg in 24 hours). May repeat in 2 hours if headache persists or recurs. 12 tablet 1 Past Week   etonogestrel-ethinyl estradiol (NUVARING) 0.12-0.015 MG/24HR vaginal ring Insert vaginally and leave in place for 4 consecutive weeks. Replace every 4 weeks. (Patient not taking: Reported on 01/24/2023) 1 each 12 Not Taking   Galcanezumab-gnlm (EMGALITY) 120 MG/ML SOAJ Inject 120 mg into the muscle every 30 (thirty) days. (Patient taking differently: Inject 120 mg into the muscle every 30 (thirty) days.) 6 mL 0 01/13/2023    Allergies  Allergen Reactions   Topamax [Topiramate] Other (See Comments)    Headaches  worsened   Dilaudid [Hydromorphone Hcl]     migraine   Adhesive [Tape] Rash    Worse with time, paper tape better    Review of Systems: Negative except for what is mentioned in HPI.     Physical Exam: BP 101/75   Pulse (!) 54   Temp 97.8 F (36.6 C) (Oral)   Resp 16   Ht 5\' 7"  (1.702 m)   Wt 78.4 kg   LMP 01/10/2023 (Approximate)   SpO2 98%   BMI 27.06 kg/m  CONSTITUTIONAL: Well-developed, well-nourished female in no acute distress.  HENT:  Normocephalic, atraumatic, External right and left ear normal. Oropharynx is clear and moist EYES: Conjunctivae and EOM are normal. Pupils are equal, round, and reactive to light. No scleral icterus.  NECK: Normal range of motion, supple, no masses SKIN: Skin is warm and dry. No rash noted. Not diaphoretic. No erythema. No pallor. NEUROLGIC: Alert and oriented to person, place, and time. Normal reflexes, muscle tone coordination. No cranial nerve deficit noted. PSYCHIATRIC: Normal mood and affect. Normal behavior. Normal judgment and thought content. RESPIRATORY: normal effort PELVIC: Deferred MUSCULOSKELETAL: Normal range of motion. No edema and no tenderness. 2+ distal pulses.   Pertinent Labs/Studies:   Results for orders placed or performed during the hospital encounter of 01/31/23 (from the past 72 hour(s))  Pregnancy, urine POC     Status: None   Collection Time: 01/31/23  6:51 AM  Result Value Ref Range   Preg Test, Ur NEGATIVE NEGATIVE    Comment:        THE SENSITIVITY OF THIS METHODOLOGY IS >24 mIU/mL        Assessment and Plan :AVISHA Villarreal is a 32 y.o. G2P1011 here for surgical management of AUB and pelvic pain.   Patient desires surgical management with TLH, BS, cysto.  The risks of surgery were discussed in detail with the patient including but not limited to: bleeding which may require transfusion or reoperation; infection which may require prolonged hospitalization or re-hospitalization and antibiotic therapy;  injury to bowel, bladder, ureters and major vessels or other surrounding organs which may lead to other procedures; formation of adhesions; need for additional procedures including laparotomy or subsequent procedures secondary to intraoperative injury or abnormal pathology; thromboembolic phenomenon; incisional problems and other postoperative or anesthesia complications.  Patient was told that the likelihood that her condition and symptoms will be treated effectively with this surgical management was moderate/high; the postoperative expectations were also  discussed in detail. The patient also understands the alternative treatment options which were discussed in full. All questions were answered.  She was told that she will be contacted by our surgical scheduler regarding the time and date of her surgery; routine preoperative instructions will be given to her by the preoperative nursing team.    Printed patient education handouts about the procedure were given to the patient to review at home.    Lorriane Shire, M.D. Minimally Invasive Gynecologic Surgery and Pelvic Pain Specialist Attending Obstetrician & Gynecologist, [facility] [employer], [facility]

## 2023-01-31 NOTE — Transfer of Care (Signed)
Immediate Anesthesia Transfer of Care Note  Patient: Catherine Villarreal  Procedure(s) Performed: TOTAL LAPAROSCOPIC HYSTERECTOMY WITH SALPINGECTOMY (Bilateral: Abdomen) LAPAROSCOPIC EXCISION OF ENDOMETRIOSIS (Abdomen) CYSTOSCOPY (Bladder)  Patient Location: PACU  Anesthesia Type:General  Level of Consciousness: drowsy and patient cooperative  Airway & Oxygen Therapy: Patient Spontanous Breathing  Post-op Assessment: Report given to RN and Post -op Vital signs reviewed and stable  Post vital signs: Reviewed and stable  Last Vitals:  Vitals Value Taken Time  BP 115/97 01/31/23 1042  Temp    Pulse 75 01/31/23 1042  Resp 12 01/31/23 1042  SpO2 100 % 01/31/23 1042  Vitals shown include unfiled device data.  Last Pain:  Vitals:   01/31/23 0722  TempSrc: Oral  PainSc: 0-No pain      Patients Stated Pain Goal: 6 (01/31/23 1610)  Complications: No notable events documented.

## 2023-01-31 NOTE — Anesthesia Postprocedure Evaluation (Signed)
Anesthesia Post Note  Patient: Catherine Villarreal  Procedure(s) Performed: TOTAL LAPAROSCOPIC HYSTERECTOMY WITH SALPINGECTOMY (Bilateral: Abdomen) LAPAROSCOPIC EXCISION OF ENDOMETRIOSIS (Abdomen) CYSTOSCOPY (Bladder)     Patient location during evaluation: PACU Anesthesia Type: General Level of consciousness: sedated and patient cooperative Pain management: pain level controlled Vital Signs Assessment: post-procedure vital signs reviewed and stable Respiratory status: spontaneous breathing Cardiovascular status: stable Anesthetic complications: no   No notable events documented.  Last Vitals:  Vitals:   01/31/23 1151 01/31/23 1200  BP:  106/77  Pulse: (!) 58 (!) 56  Resp: 15 14  Temp:    SpO2: 99% 100%    Last Pain:  Vitals:   01/31/23 1151  TempSrc:   PainSc: 3                  Lewie Loron

## 2023-01-31 NOTE — Telephone Encounter (Signed)
Per chart review RX for 20 tablets were sent to pharmacy but noted error message. Called pharmacy and was on hold for 33 minutes and unable to speak to anyone.  Nancy Fetter

## 2023-01-31 NOTE — Op Note (Signed)
Catherine Villarreal PROCEDURE DATE: 01/31/2023  PREOPERATIVE DIAGNOSIS: abnormal uterine bleeding, pelvic pain POSTOPERATIVE DIAGNOSIS: abnormal uterine bleeding, pelvic pain PROCEDURE:    total laparoscopic hysterectomy, bilateral salpingectomy, cystoscopy SURGEON: Lorriane Shire, MD ASSISTANT:  [place] Bing, MD  , Clement Sayres, [place]  An experienced assistant was required given the standard of surgical care given the complexity of the case.  This assistant was needed for exposure, dissection, suctioning, retraction, instrument exchange, and for overall help during the procedure.  INDICATIONS: 32 y.o. G2P1011 with AUB and pelvic pain.  Risks of surgery were discussed with the patient including but not limited to: bleeding which may require transfusion; infection which may require antibiotics; injury to surrounding organs; need for additional procedures including laparotomy;  and other postoperative/anesthesia complications. Written informed consent was obtained.    FINDINGS:  Normal external genitalia, 8 wk size mobile uterus with Normal contours.  Laparoscopically: normal upper abdominal survey, normal appearing uterus, normal bilateral fallopian tubes, slightly enlarged ovaries, bilateral ureters seen, normal anterior cul de sac, normal posterior cul de sac Cystoscopically: normal bladder wall without apparent injury, bilateral ureteral orifices, fluorescein-stained urine from bilateral ureteral orifices   ANESTHESIA: General INTRAVENOUS FLUIDS:  700 ml of LR ESTIMATED BLOOD LOSS:  20 ml URINE OUTPUT: 75  ml SPECIMENS: uterus, cervix, bilateral fallopian tubes COMPLICATIONS:  None immediate.  PROCEDURE: The risks, benefits, and alternatives of surgery were explained, understood, and accepted. Consents were signed. All questions were answered. She was taken to the operating room and general anesthesia was applied without complication. She was placed in the dorsal lithotomy position and her  abdomen and vagina were prepped and draped after she had been carefully positioned on the table. A bimanual exam revealed a 8 week size uterus that was mobile. Her adnexa were not enlarged. A Foley catheter was placed and it drained clear throughout the case. A speculum was placed and the cervix visualized. The cervix was measured and the uterus was sounded to 6 cm. A Rumi uterine manipulator using a 3cm cup and 6cm tip was placed without difficulty.  Gloves were changed and attention was turned to the abdomen. All incisions were infiltrated with local anesthetic. An 8mm incision was made in the LUQ and an optiview Airseal trocar was introduced into the abdomen. The Entry was confirmed with low opening intraabdominal pressure and visualization and the abdomen was then insufflated. After good pneumoperitoneum was established, the abdomen was surveyed including the upper abdomen.She was placed in Trendelenburg position and ports were placed in the RLQ, RUQ, and umbilicus    The right mesosalpinx was serially cauterized and cut and removed from the abdomen. The uteroovarian ligament was cauterized and divided. The round ligament was divided. The leaves of the broad ligament were separated and the anterior leaf dissected down to the level of the Koh ring.  Attention was turned to the left side where the mesosalpinx was divided and the left tube passed off. The uteroovarian ligament was cauterized and cut as well as the round ligament. The leaves of the broad ligament were separated. The anterior leaf dissected and the bladder flap further developed. The posterior leaf was dissected and the uterine vessels were skeletonized. The uterine vessels were cauterized, divided, and lateralized to the cup edge. Attention returned to the right side where the uterine vessels were cauterized, cut and lateralized to the cup edge.  The bladder was pushed from the operative site and the tissue dissected down to the pubocervical  fascia. Once adequately mobilized, the  colpotomy was completed using harmonic scalpel device and the uterus removed from the pelvis.    The vaginal cuff was then closed using 0 Vloc in a running fashion. There was a small vessel from the cuff noted to be bleeding and it was treated with ligasure. The pelvis was irrigated. All pedicles were inspected. No bleeding was noted.   CO2 pressures were lowered to 7mm Hg.  Again, no bleeding was noted.  Ureters were noted deep in the pelvis to be peristalsing. At this point the procedure was completed.  The remaining instruments were removed.  The patient was taken out of Trendelenburg positioning.    Attention was then turned the vagina and the cuff was inspected. No bleeding was noted. The Foley catheter was removed.  Cystoscopy was performed.  No sutures or bladder injuries were noted.  Ureters were noted with efflux of fluorescein stained urine from bilateral ureteral orifices.  Foley was left out after the cystoscopic fluid was drained and cystoscope removed.  The skin was then closed with subcuticular stitches of 4-0 Vicryl. The skin was cleansed Dermabond was applied. Sponge, lap, needle, instrument counts were correct x2. Patient tolerated the procedure very well. She was awakened from anesthesia, extubated and taken to recovery in stable condition.    Lorriane Shire, MD Minimally Invasive Gynecologic Surgery  Obstetrics and Gynecology, [facility] for [facility], [facility] 01/31/2023

## 2023-01-31 NOTE — Telephone Encounter (Signed)
Patient insurance company [employer]) called and said her Rx for Oxycodone was rejected because it was put in for a one day supply instead of 5, patient need this rx changed

## 2023-02-01 ENCOUNTER — Encounter: Payer: Self-pay | Admitting: Family Medicine

## 2023-02-01 ENCOUNTER — Encounter (HOSPITAL_BASED_OUTPATIENT_CLINIC_OR_DEPARTMENT_OTHER): Payer: Self-pay | Admitting: Obstetrics and Gynecology

## 2023-02-01 ENCOUNTER — Telehealth: Payer: Self-pay | Admitting: Family Medicine

## 2023-02-01 LAB — SURGICAL PATHOLOGY

## 2023-02-01 NOTE — Telephone Encounter (Signed)
Called pharmacy to inquire about the problem with Oxycodone prescription. I was told that pt picked up her medication today.

## 2023-02-01 NOTE — Progress Notes (Signed)
Spoke with patient who stated that pain is 3 while lying down.  Goes up to a  when ambulating. Pt discharged yesterday with Rx for oxy ir sent to pharmacy.  Pt's husband went to pick up and pharmacy told him they could not fill Rx.  Pt stated that she has called MD office this am.  This RN called the pharmacy who stated that they need a prior-auth from provider or either pt can pay out of pocket with rx card.  I called pt back and told her this information.  She will check with pharmacy.

## 2023-02-01 NOTE — Telephone Encounter (Signed)
Patient had surgery yesterday but insurance will not pay for medication due to it being enter as her needing 20 pills in one day. She would like a nurse to call back and get this solved for her
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
## 2023-02-19 ENCOUNTER — Other Ambulatory Visit: Payer: Self-pay | Admitting: Internal Medicine

## 2023-02-19 DIAGNOSIS — K2100 Gastro-esophageal reflux disease with esophagitis, without bleeding: Secondary | ICD-10-CM

## 2023-02-22 ENCOUNTER — Ambulatory Visit: Payer: Medicaid Other | Admitting: Obstetrics and Gynecology

## 2023-02-22 ENCOUNTER — Other Ambulatory Visit: Payer: Self-pay

## 2023-02-22 ENCOUNTER — Encounter: Payer: Self-pay | Admitting: Obstetrics and Gynecology

## 2023-02-22 VITALS — BP 107/84 | HR 92 | Wt 171.8 lb

## 2023-02-22 DIAGNOSIS — N946 Dysmenorrhea, unspecified: Secondary | ICD-10-CM

## 2023-02-22 DIAGNOSIS — Z09 Encounter for follow-up examination after completed treatment for conditions other than malignant neoplasm: Secondary | ICD-10-CM

## 2023-02-22 DIAGNOSIS — G8929 Other chronic pain: Secondary | ICD-10-CM

## 2023-02-22 DIAGNOSIS — R102 Pelvic and perineal pain: Secondary | ICD-10-CM

## 2023-02-22 DIAGNOSIS — N920 Excessive and frequent menstruation with regular cycle: Secondary | ICD-10-CM

## 2023-02-22 NOTE — Progress Notes (Signed)
   POSTOPERATIVE VISIT NOTE   Subjective:     Catherine Villarreal is a 33 y.o. G2P1011 who presents to the clinic 3 weeks status post  TLH, BS, cysto  for abnormal uterine bleeding and pelvic pain. Eating a regular diet without difficulty. Bowel movements are normal. The patient is not having any pain. Incision: doing well; had one incision in which there was some separation, placed a butterfly bandage and had a reaction to the adhesion Vaginal bleeding: minimal light pink spotting Resumed sexual acitivity: no Feeling much better; currently around the time of anticipated menses and has the usual mood symptoms associated with menses but no pain  The following portions of the patient's history were reviewed and updated as appropriate: allergies, current medications, past family history, past medical history, past social history, past surgical history, and problem list..   Review of Systems Pertinent items are noted in HPI.    Objective:    BP 107/84   Pulse 92   Wt 171 lb 12.8 oz (77.9 kg)   LMP 01/10/2023 (Approximate)   BMI 26.91 kg/m  General:  alert, cooperative, and no distress  Abdomen: soft, non-tender  Incision:   healing well, no drainage, no erythema, no hernia, no seroma, no swelling, no dehiscence, incision well approximated  Pelvic:   Exam deferred.    Pathology Results: FINAL MICROSCOPIC DIAGNOSIS:   A. CERVIX, UTERUS, BILATERAL FALLOPIAN TUBES, HYSTERECTOMY:  - Uterus with benign inactive endometrium  - Adenomyosis  - Benign unremarkable cervix  - Benign unremarkable bilateral fallopian tubes  - No evidence of malignancy     Assessment:   Doing well postoperatively. Operative findings again reviewed. Pathology report discussed.  Reviewed intra-op photos  Plan:    There are no diagnoses linked to this encounter.  Activity restrictions: no lifting more than 10 pounds and pelvic rest Follow up: at 8 weeks postop  Carter Quarry, MD Obstetrician &  Gynecologist, [facility] for [employer], [facility]

## 2023-02-23 ENCOUNTER — Telehealth (INDEPENDENT_AMBULATORY_CARE_PROVIDER_SITE_OTHER): Payer: Medicaid Other | Admitting: Psychiatry

## 2023-02-23 ENCOUNTER — Encounter: Payer: Self-pay | Admitting: Psychiatry

## 2023-02-23 DIAGNOSIS — F330 Major depressive disorder, recurrent, mild: Secondary | ICD-10-CM | POA: Diagnosis not present

## 2023-02-23 NOTE — Patient Instructions (Signed)
 Hold starting any psychotropics.  Next appointment- 3/26 at 1 pm

## 2023-02-28 ENCOUNTER — Encounter: Payer: Medicaid Other | Admitting: Physical Therapy

## 2023-03-01 ENCOUNTER — Telehealth: Payer: Self-pay

## 2023-03-01 NOTE — Telephone Encounter (Signed)
 Copied from CRM [id number]. Topic: Clinical - Medication Refill >> Feb 28, 2023  3:45 PM Rosina BIRCH wrote: Most Recent Primary Care Visit:  Provider: MERCEDA FILE R  Department: [facility]  Visit Type: PATIENT OUTREACH 60  Date: 11/15/2022  Medication: SUMAtriptan   Has the patient contacted their pharmacy? Yes (Agent: If no, request that the patient contact the pharmacy for the refill. If patient does not wish to contact the pharmacy document the reason why and proceed with request.) (Agent: If yes, when and what did the pharmacy advise?)  Is this the correct pharmacy for this prescription? Yes and pt want all her meds sent to this pharmacy If no, delete pharmacy and type the correct one.  This is the patient's preferred pharmacy:  [facility] [address] [place]  Has the prescription been filled recently? No  Is the patient out of the medication? Yes  Has the patient been seen for an appointment in the last year OR does the patient have an upcoming appointment? Yes  Can we respond through MyChart? Yes  Agent: Please be advised that Rx refills may take up to 3 business days. We ask that you follow-up with your pharmacy.

## 2023-03-02 ENCOUNTER — Other Ambulatory Visit: Payer: Self-pay | Admitting: Internal Medicine

## 2023-03-02 ENCOUNTER — Other Ambulatory Visit: Payer: Self-pay

## 2023-03-02 DIAGNOSIS — G43009 Migraine without aura, not intractable, without status migrainosus: Secondary | ICD-10-CM

## 2023-03-02 DIAGNOSIS — G44041 Chronic paroxysmal hemicrania, intractable: Secondary | ICD-10-CM

## 2023-03-02 MED ORDER — SUMATRIPTAN SUCCINATE 50 MG PO TABS: 50.0000 mg | ORAL_TABLET | ORAL | 1 refills | Status: DC | PRN

## 2023-03-02 NOTE — Telephone Encounter (Signed)
 Medication refill has been sent to DR Yetta Barre

## 2023-03-02 NOTE — Telephone Encounter (Signed)
 Patient is needing a new prescription written and sent over to the [facility] [address] in [place]  pharmacy it needs to be 12 pills for 23 days    medication is sumatriptan  patient is needing this medication before the weekend she has a really bad headache she would like a call back regarding this

## 2023-03-06 ENCOUNTER — Ambulatory Visit
Admission: RE | Admit: 2023-03-06 | Discharge: 2023-03-06 | Disposition: A | Discharge status: Home / Self Care | Payer: Medicaid Other | Source: Ambulatory Visit | Attending: Nurse Practitioner | Admitting: Nurse Practitioner

## 2023-03-06 VITALS — BP 105/71 | HR 96 | Temp 98.0°F | Resp 18

## 2023-03-06 DIAGNOSIS — J029 Acute pharyngitis, unspecified: Secondary | ICD-10-CM | POA: Diagnosis not present

## 2023-03-06 LAB — POCT RAPID STREP A (OFFICE): Rapid Strep A Screen: NEGATIVE

## 2023-03-06 LAB — POCT MONO SCREEN (KUC): Mono, POC: NEGATIVE

## 2023-03-06 NOTE — ED Triage Notes (Signed)
 Sore throat since yesterday. Pain more on left side of throat.

## 2023-03-06 NOTE — ED Provider Notes (Signed)
 [facility]    CSN: [id number] Arrival date & time: 03/06/23  1023      History   Chief Complaint Chief Complaint  Patient presents with   Sore Throat    Possibly strep or tonsils - Entered by patient    HPI Catherine Villarreal is a 33 y.o. female.   Patient presents today with 1 day history of chills, feeling warm, sore throat, and pain on the outside of her throat when she touches her skin.  Also has a headache and fatigue, but these are symptoms that she has normally and reports she is out of Emgality  and so has been having a lot of migraines.  No known fevers, body aches, cough, shortness of breath or chest pain, runny or stuffy nose, ear pain, abdominal pain, nausea/vomiting, or diarrhea.  Reports that she is hungry but has not been eating as much because it hurts to swallow.  Has taken ibuprofen  which does help with the sore throat temporarily.  No known sick contacts.    Past Medical History:  Diagnosis Date   Abnormal uterine bleeding (AUB)    Chronic pelvic pain in female    GAD (generalized anxiety disorder)    GERD (gastroesophageal reflux disease)    Left nephrolithiasis    urologist--- dr r.  davis;   last ultrasound in care everywhere 01-04-2023 .  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   Elevated LFTs 03/14/2022   Intractable chronic paroxysmal hemicrania 03/14/2022   High triglycerides 03/14/2022   Gastroesophageal reflux disease with esophagitis without hemorrhage 12/28/2021   Pure hypertriglyceridemia 03/01/2021   Chronic hyperglycemia 03/01/2021   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) 01/10/2021   Recurrent  nephrolithiasis 05/13/2020   Family history of breast cancer 01/30/2020   Presence of IUD 04/25/2018    Past Surgical History:  Procedure Laterality Date   CYSTOSCOPY N/A 01/31/2023   Procedure: CYSTOSCOPY;  Surgeon: Jeralyn Crutch, MD;  Location: [facility];  Service: Gynecology;  Laterality: N/A;   LAPAROSCOPIC CHOLECYSTECTOMY  2017   dr gross   TOTAL LAPAROSCOPIC HYSTERECTOMY WITH SALPINGECTOMY Bilateral 01/31/2023   Procedure: TOTAL LAPAROSCOPIC HYSTERECTOMY WITH SALPINGECTOMY;  Surgeon: Jeralyn Crutch, MD;  Location: [facility];  Service: Gynecology;  Laterality: Bilateral;    OB History     Gravida  2   Para  1   Term  1   Preterm  0   AB  1   Living  1      SAB  1   IAB  0   Ectopic  0   Multiple  0   Live Births  1            Home Medications    Prior to Admission medications   Medication Sig Start Date End Date Taking? Authorizing Provider  EMGALITY  120 MG/ML SOAJ INJECT 120 MG IN THE MUSCLE EVERY 30 DAYS. 03/02/23   Joshua Debby CROME, MD  esomeprazole  (NEXIUM ) 40 MG capsule TAKE 1 CAPSULE(40 MG) BY MOUTH DAILY 02/22/23   Joshua Debby CROME, MD  SUMAtriptan  (IMITREX ) 50 MG tablet TAKE 1 TABLET BY MOUTH EVERY 2 HOURS AS  NEEDED FOR HEADACHE. IF PERSISTS MAY REPEAT IN 2HOURS FOR MIGRAINE(UP TO 200 MG IN 24 HOURS). 03/02/23   Joshua Debby CROME, MD    Family History Family History  Problem Relation Age of Onset   Cancer Mother    Breast cancer Mother        pt states diagnosed at age 63/40   Thyroid  cancer Mother    Other Mother    Thyroid  disease Mother    Yvone' disease Mother    Stroke Father    Thyroid  disease Maternal Aunt    Thyroid  disease Maternal Grandmother    Drug abuse Brother     Social History Social History   Tobacco Use   Smoking status: Former    Current packs/day: 0.75    Types: Cigarettes   Smokeless tobacco: Never   Tobacco comments:    01-25-2023  pt stated quit smoking 2022,   started 2009  Vaping Use   Vaping status: Every Day   Substances: Nicotine , Flavoring, Nicotine -salt   Devices: geek bar  Substance Use Topics   Alcohol use: Not Currently    Comment: very rare   Drug use: Not Currently     Allergies   Topamax  [topiramate ], Dilaudid  [hydromorphone  hcl], and Adhesive [tape]   Review of Systems Review of Systems Per HPI  Physical Exam Triage Vital Signs ED Triage Vitals  Encounter Vitals Group     BP 03/06/23 1039 105/71     Systolic BP Percentile --      Diastolic BP Percentile --      Pulse Rate 03/06/23 1039 96     Resp 03/06/23 1039 18     Temp 03/06/23 1039 98 F (36.7 C)     Temp Source 03/06/23 1039 Oral     SpO2 03/06/23 1039 98 %     Weight --      Height --      Head Circumference --      Peak Flow --      Pain Score 03/06/23 1040 7     Pain Loc --      Pain Education --      Exclude from Growth Chart --    No data found.  Updated Vital Signs BP 105/71 (BP Location: Right Arm)   Pulse 96   Temp 98 F (36.7 C) (Oral)   Resp 18   LMP 01/10/2023 (Approximate)   SpO2 98%   Visual Acuity Right Eye Distance:   Left Eye Distance:   Bilateral Distance:    Right Eye Near:   Left Eye Near:    Bilateral Near:     Physical Exam Vitals and nursing note reviewed.  Constitutional:      General: She is not in acute distress.    Appearance: She is well-developed. She is not toxic-appearing.  HENT:     Head: Normocephalic and atraumatic.     Right Ear: Tympanic membrane and ear canal normal. No drainage, swelling or tenderness. No middle ear effusion. Tympanic membrane is not erythematous.     Left Ear: Tympanic membrane and ear canal normal. No drainage, swelling or tenderness.  No middle ear effusion. Tympanic membrane is not erythematous.     Nose: No congestion or rhinorrhea.     Mouth/Throat:     Mouth: Mucous membranes are moist.     Pharynx: Oropharynx is clear. Uvula midline. Posterior oropharyngeal erythema  present. No pharyngeal swelling, oropharyngeal exudate or uvula swelling.     Tonsils: No tonsillar exudate.  0 on the right. 0 on the left.  Eyes:     Extraocular Movements:     Right eye: Normal extraocular motion.     Left eye: Normal extraocular motion.  Cardiovascular:     Rate and Rhythm: Normal rate and regular rhythm.  Pulmonary:     Effort: Pulmonary effort is normal. No respiratory distress.     Breath sounds: Normal breath sounds. No wheezing, rhonchi or rales.  Musculoskeletal:     Cervical back: Normal range of motion and neck supple.  Lymphadenopathy:     Cervical: No cervical adenopathy.  Skin:    General: Skin is warm and dry.     Capillary Refill: Capillary refill takes less than 2 seconds.     Coloration: Skin is not pale.     Findings: No erythema or rash.  Neurological:     Mental Status: She is alert and oriented to person, place, and time.  Psychiatric:        Behavior: Behavior is cooperative.      UC Treatments / Results  Labs (all labs ordered are listed, but only abnormal results are displayed) Labs Reviewed  POCT MONO SCREEN ([facility]) - Normal  CULTURE, GROUP A STREP [facility])  POCT RAPID STREP A (OFFICE)    EKG   Radiology No results found.  Procedures Procedures (including critical care time)  Medications Ordered in UC Medications - No data to display  Initial Impression / Assessment and Plan / UC Course  I have reviewed the triage vital signs and the nursing notes.  Pertinent labs & imaging results that were available during my care of the patient were reviewed by me and considered in my medical decision making (see chart for details).   Patient is well-appearing, normotensive, afebrile, not tachycardic, not tachypneic, oxygenating well on room air.    1. Acute pharyngitis, unspecified etiology Rapid strep negative, throat culture pending Monospot negative We discussed this may be a viral sore throat Supportive care discussed with  patient Return and ER precautions discussed  The patient was given the opportunity to ask questions.  All questions answered to their satisfaction.  The patient is in agreement to this plan.   Final Clinical Impressions(s) / UC Diagnoses   Final diagnoses:  Acute pharyngitis, unspecified etiology     Discharge Instructions      Rapid strep throat and mononucleosis test are negative today.  The throat culture is pending; we will contact you later this week if positive.  In the meantime, continue ibuprofen  as needed for throat pain.  You can also use Cepacol lozenges to help with throat pain.  Seek care if symptoms do not improve with treatment or if symptoms worsen despite treatment.     ED Prescriptions   None    PDMP not reviewed this encounter.   Chandra Harlene LABOR, NP 03/06/23 1130

## 2023-03-06 NOTE — Discharge Instructions (Signed)
 Rapid strep throat and mononucleosis test are negative today.  The throat culture is pending; we will contact you later this week if positive.  In the meantime, continue ibuprofen  as needed for throat pain.  You can also use Cepacol lozenges to help with throat pain.  Seek care if symptoms do not improve with treatment or if symptoms worsen despite treatment.

## 2023-03-07 ENCOUNTER — Ambulatory Visit
Admission: EM | Admit: 2023-03-07 | Discharge: 2023-03-07 | Disposition: A | Discharge status: Home / Self Care | Payer: Medicaid Other | Attending: Family Medicine | Admitting: Family Medicine

## 2023-03-07 ENCOUNTER — Encounter: Payer: Medicaid Other | Admitting: Physical Therapy

## 2023-03-07 ENCOUNTER — Encounter: Payer: Self-pay | Admitting: Emergency Medicine

## 2023-03-07 DIAGNOSIS — J039 Acute tonsillitis, unspecified: Secondary | ICD-10-CM

## 2023-03-07 LAB — POCT RAPID STREP A (OFFICE): Rapid Strep A Screen: NEGATIVE

## 2023-03-07 MED ORDER — AMOXICILLIN 875 MG PO TABS: 875.0000 mg | ORAL_TABLET | Freq: Two times a day (BID) | ORAL | 0 refills | Status: DC

## 2023-03-07 MED ORDER — LIDOCAINE VISCOUS HCL 2 % MT SOLN: 10.0000 mL | OROMUCOSAL | 0 refills | Status: DC | PRN

## 2023-03-07 NOTE — ED Triage Notes (Signed)
 Was seen yesterday for sore throat.  States throat is worse today.  States left side of nose is draining.  States neck is painful and head is hurting.

## 2023-03-08 ENCOUNTER — Other Ambulatory Visit (HOSPITAL_COMMUNITY): Payer: Self-pay

## 2023-03-08 ENCOUNTER — Telehealth: Payer: Self-pay

## 2023-03-08 NOTE — Telephone Encounter (Signed)
Pharmacy Patient Advocate Encounter   Received notification from  [employer] that prior authorization for Emgality 120MG /ML auto-injectors (migraine)is required/requested.   Insurance verification completed.   The patient is insured through [facility] MEDICAID .   Per test claim: PA required; PA started via CoverMyMeds. KEY  [id number] . Waiting for clinical questions to populate.

## 2023-03-08 NOTE — ED Provider Notes (Signed)
[facility]    CSN: [id number] Arrival date & time: 03/07/23  1043      History   Chief Complaint No chief complaint on file.   HPI Catherine Villarreal is a 33 y.o. female.   Patient presenting today following up on a visit to this urgent care yesterday for sore throat.  States the sore throat is progressively worsening and she is now minimally able to even swallow liquids and having pain in the neck.  Very mild sinus drainage, and denies cough, fever, chills, chest pain, shortness of breath.  Trying over-the-counter remedies with no relief.  Rapid strep was negative yesterday, rapid mono was negative.    Past Medical History:  Diagnosis Date   Abnormal uterine bleeding (AUB)    Chronic pelvic pain in female    GAD (generalized anxiety disorder)    GERD (gastroesophageal reflux disease)    Left nephrolithiasis    urologist--- dr r.  davis;   last ultrasound in care everywhere 01-04-2023 .  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   Elevated LFTs 03/14/2022   Intractable chronic paroxysmal hemicrania 03/14/2022   High triglycerides 03/14/2022   Gastroesophageal reflux disease with esophagitis without hemorrhage 12/28/2021   Pure hypertriglyceridemia 03/01/2021   Chronic hyperglycemia 03/01/2021   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) 01/10/2021   Recurrent nephrolithiasis 05/13/2020   Family history of breast cancer 01/30/2020   Presence of IUD 04/25/2018    Past Surgical History:  Procedure Laterality Date   CYSTOSCOPY N/A 01/31/2023   Procedure: CYSTOSCOPY;  Surgeon: Lorriane Shire, MD;  Location: [facility];  Service: Gynecology;  Laterality: N/A;   LAPAROSCOPIC CHOLECYSTECTOMY  2017   dr gross   TOTAL LAPAROSCOPIC HYSTERECTOMY WITH SALPINGECTOMY Bilateral 01/31/2023   Procedure: TOTAL LAPAROSCOPIC HYSTERECTOMY WITH SALPINGECTOMY;  Surgeon: Lorriane Shire, MD;  Location: [facility];  Service: Gynecology;  Laterality: Bilateral;    OB History     Gravida  2   Para  1   Term  1   Preterm  0   AB  1   Living  1      SAB  1   IAB  0   Ectopic  0   Multiple  0   Live Births  1            Home Medications    Prior to Admission medications   Medication Sig Start Date End Date Taking? Authorizing Provider  amoxicillin (AMOXIL) 875 MG tablet Take 1 tablet (875 mg total) by mouth 2 (two) times daily. 03/07/23  Yes Particia Nearing, PA-C  lidocaine (XYLOCAINE) 2 % solution Use as directed 10 mLs in the mouth or throat every 3 (three) hours as needed. 03/07/23  Yes Particia Nearing, PA-C  EMGALITY 120 MG/ML SOAJ INJECT 120 MG IN THE MUSCLE EVERY 30 DAYS. 03/02/23   Etta Grandchild, MD  esomeprazole (NEXIUM) 40 MG capsule TAKE 1 CAPSULE(40 MG) BY MOUTH DAILY 02/22/23   Etta Grandchild, MD  SUMAtriptan (IMITREX) 50 MG tablet TAKE 1 TABLET BY MOUTH EVERY 2 HOURS AS NEEDED FOR HEADACHE. IF PERSISTS MAY REPEAT  IN 2HOURS FOR MIGRAINE(UP TO 200 MG IN 24 HOURS). 03/02/23   Etta Grandchild, MD    Family History Family History  Problem Relation Age of Onset   Cancer Mother    Breast cancer Mother        pt states diagnosed at age 86/40   Thyroid cancer Mother    Other Mother    Thyroid disease Mother    Luiz Blare' disease Mother    Stroke Father    Thyroid disease Maternal Aunt    Thyroid disease Maternal Grandmother    Drug abuse Brother     Social History Social History   Tobacco Use   Smoking status: Former    Current packs/day: 0.75    Types: Cigarettes   Smokeless tobacco: Never   Tobacco comments:    01-25-2023  pt stated quit  smoking 2022,  started 2009  Vaping Use   Vaping status: Every Day   Substances: Nicotine, Flavoring, Nicotine-salt   Devices: geek bar  Substance Use Topics   Alcohol use: Not Currently    Comment: very rare   Drug use: Not Currently     Allergies   Topamax [topiramate], Dilaudid [hydromorphone hcl], and Adhesive [tape]   Review of Systems Review of Systems Per HPI  Physical Exam Triage Vital Signs ED Triage Vitals  Encounter Vitals Group     BP 03/07/23 1148 102/71     Systolic BP Percentile --      Diastolic BP Percentile --      Pulse Rate 03/07/23 1148 95     Resp 03/07/23 1148 18     Temp 03/07/23 1148 98.2 F (36.8 C)     Temp Source 03/07/23 1148 Oral     SpO2 03/07/23 1148 98 %     Weight --      Height --      Head Circumference --      Peak Flow --      Pain Score 03/07/23 1149 6     Pain Loc --      Pain Education --      Exclude from Growth Chart --    No data found.  Updated Vital Signs BP 102/71 (BP Location: Right Arm)   Pulse 95   Temp 98.2 F (36.8 C) (Oral)   Resp 18   LMP 01/10/2023 (Approximate)   SpO2 98%   Visual Acuity Right Eye Distance:   Left Eye Distance:   Bilateral Distance:    Right Eye Near:   Left Eye Near:    Bilateral Near:     Physical Exam Vitals and nursing note reviewed.  Constitutional:      Appearance: Normal appearance.  HENT:     Head: Atraumatic.     Right Ear: Tympanic membrane and external ear normal.     Left Ear: Tympanic membrane and external ear normal.     Nose: Rhinorrhea present.     Mouth/Throat:     Mouth: Mucous membranes are moist.     Pharynx: Oropharyngeal exudate and posterior oropharyngeal erythema present.     Comments: Moderate erythema, edema to the right tonsillar region with a deep pocket of exudates Eyes:     Extraocular Movements: Extraocular movements intact.     Conjunctiva/sclera: Conjunctivae normal.  Cardiovascular:     Rate and Rhythm: Normal rate and regular  rhythm.     Heart sounds: Normal heart sounds.  Pulmonary:     Effort: Pulmonary effort is normal.  Breath sounds: Normal breath sounds. No wheezing.  Musculoskeletal:        General: Normal range of motion.     Cervical back: Normal range of motion and neck supple.  Lymphadenopathy:     Cervical: Cervical adenopathy present.  Skin:    General: Skin is warm and dry.  Neurological:     Mental Status: She is alert and oriented to person, place, and time.  Psychiatric:        Mood and Affect: Mood normal.        Thought Content: Thought content normal.      UC Treatments / Results  Labs (all labs ordered are listed, but only abnormal results are displayed) Labs Reviewed  POCT RAPID STREP A (OFFICE)    EKG   Radiology No results found.  Procedures Procedures (including critical care time)  Medications Ordered in UC Medications - No data to display  Initial Impression / Assessment and Plan / UC Course  I have reviewed the triage vital signs and the nursing notes.  Pertinent labs & imaging results that were available during my care of the patient were reviewed by me and considered in my medical decision making (see chart for details).     Rapid strep was again negative but given exam findings and severe worsening symptoms will cover with Amoxil, viscous lidocaine and over-the-counter pain relievers.  Return for worsening symptoms.  Final Clinical Impressions(s) / UC Diagnoses   Final diagnoses:  Acute tonsillitis, unspecified etiology   Discharge Instructions   None    ED Prescriptions     Medication Sig Dispense Auth. Provider   amoxicillin (AMOXIL) 875 MG tablet Take 1 tablet (875 mg total) by mouth 2 (two) times daily. 20 tablet Particia Nearing, PA-C   lidocaine (XYLOCAINE) 2 % solution Use as directed 10 mLs in the mouth or throat every 3 (three) hours as needed. 100 mL Particia Nearing, [place]      PDMP not reviewed this encounter.    Particia Nearing, [place] 03/08/23 1919

## 2023-03-09 ENCOUNTER — Telehealth (HOSPITAL_BASED_OUTPATIENT_CLINIC_OR_DEPARTMENT_OTHER): Payer: Self-pay

## 2023-03-09 LAB — CULTURE, GROUP A STREP (THRC)

## 2023-03-09 NOTE — Telephone Encounter (Signed)
Pharmacy Patient Advocate Encounter   Received notification from CoverMyMeds that prior authorization for Emgality 120MG /ML auto-injectors is required/requested.   Insurance verification completed.   The patient is insured through [facility] MEDICAID .   Per test claim: PA required; PA submitted to above mentioned insurance via CoverMyMeds Key/confirmation #/EOC [id number] Status is pending

## 2023-03-12 ENCOUNTER — Other Ambulatory Visit (HOSPITAL_COMMUNITY): Payer: Self-pay

## 2023-03-12 NOTE — Telephone Encounter (Signed)
Pharmacy Patient Advocate Encounter  Received notification from [facility] MEDICAID that Prior Authorization for Emgality 120MG /ML /auto-injectors (migraine) has been APPROVED from 03/09/23 to 03/08/24. Unable to obtain price due to refill too soon rejection, last fill date 03/09/23 next available fill date02/09/25   PA #/Case ID/Reference #: [id number]

## 2023-03-21 ENCOUNTER — Other Ambulatory Visit (HOSPITAL_COMMUNITY): Payer: Self-pay

## 2023-03-28 ENCOUNTER — Other Ambulatory Visit: Payer: Self-pay

## 2023-03-28 ENCOUNTER — Encounter: Payer: Self-pay | Admitting: Obstetrics and Gynecology

## 2023-03-28 ENCOUNTER — Ambulatory Visit: Payer: Medicaid Other | Admitting: Obstetrics and Gynecology

## 2023-03-28 VITALS — BP 108/77 | HR 73 | Wt 175.1 lb

## 2023-03-28 DIAGNOSIS — N760 Acute vaginitis: Secondary | ICD-10-CM

## 2023-03-28 DIAGNOSIS — Z09 Encounter for follow-up examination after completed treatment for conditions other than malignant neoplasm: Secondary | ICD-10-CM

## 2023-03-28 MED ORDER — FLUCONAZOLE 150 MG PO TABS: 150.0000 mg | ORAL_TABLET | Freq: Once | ORAL | 1 refills | Status: AC

## 2023-03-28 NOTE — Progress Notes (Signed)
   POSTOPERATIVE VISIT NOTE   Subjective:     Catherine Villarreal is a 33 y.o. G2P1011 who presents to the clinic 8 weeks status post  TLH,BS, cysto  for abnormal uterine bleeding. Eating a regular diet without difficulty. Incision: are healing well  Vaginal bleeding: none Resumed sexual acitivity: not Had recent strep infection and started on antibiotics and got subsequent yeast infection.   The following portions of the patient's history were reviewed and updated as appropriate: allergies, current medications, past family history, past medical history, past social history, past surgical history, and problem list..   Review of Systems Pertinent items are noted in HPI.    Objective:    BP 108/77   Pulse 73   Wt 175 lb 1.6 oz (79.4 kg)   LMP 01/10/2023 (Approximate)   BMI 27.42 kg/m  General:  alert, cooperative, and no distress  Abdomen: soft, non-tender  Incision:   healing well, no drainage, no erythema, no hernia, no seroma, no swelling, no dehiscence, incision well approximated  Pelvic:    Normal external genitalia, thick white discharge within vaginal canal, intact vaginal wall with visible suture, nontender and intact on digital palpation        Assessment:   Doing well postoperatively. Operative findings again reviewed. Pathology report discussed.   Plan:    Diflucan  for yeast infection  Doing well and meeting postop goals  Activity restrictions:  pelvic rest additional 3 weeks, otherwise return to usual activities Follow up: as needed  Reyes Fifield, MD Obstetrician & Gynecologist, [facility] [employer], [facility]

## 2023-04-11 ENCOUNTER — Telehealth: Payer: Self-pay

## 2023-04-11 NOTE — Telephone Encounter (Signed)
 Please advise

## 2023-04-11 NOTE — Telephone Encounter (Signed)
Copied from CRM [id number]. Topic: Clinical - Medical Advice >> Apr 11, 2023 11:55 AM Denese Killings wrote:  Reason for CRM: Patient wants doctor opinion on what to do. 2 days after she took her [facility] she has gotten sick twice both in January and February. She wants to know if she should stop taking it. She just took it on 02/13. Symptoms- last time it was sore throat (end up having strep) and now it feels like she has a sinus infection, coughing, runny nose, nose congestion, headache, and sore throat.

## 2023-04-18 NOTE — Telephone Encounter (Signed)
 Patient still waiting for a callback for advice on this. Please follow up

## 2023-04-19 DIAGNOSIS — E348 Other specified endocrine disorders: Secondary | ICD-10-CM | POA: Diagnosis not present

## 2023-04-19 NOTE — Telephone Encounter (Signed)
 Called pt and relayed PCP response. Pt verbalized understanding

## 2023-04-25 ENCOUNTER — Ambulatory Visit: Payer: Medicaid Other | Admitting: Internal Medicine

## 2023-04-25 ENCOUNTER — Encounter: Payer: Self-pay | Admitting: Internal Medicine

## 2023-04-25 VITALS — BP 100/64 | HR 90 | Temp 98.3°F | Ht 67.0 in | Wt 179.6 lb

## 2023-04-25 DIAGNOSIS — R7989 Other specified abnormal findings of blood chemistry: Secondary | ICD-10-CM

## 2023-04-25 DIAGNOSIS — I950 Idiopathic hypotension: Secondary | ICD-10-CM | POA: Insufficient documentation

## 2023-04-25 DIAGNOSIS — E781 Pure hyperglyceridemia: Secondary | ICD-10-CM

## 2023-04-25 DIAGNOSIS — R739 Hyperglycemia, unspecified: Secondary | ICD-10-CM

## 2023-04-25 DIAGNOSIS — G43E09 Chronic migraine with aura, not intractable, without status migrainosus: Secondary | ICD-10-CM

## 2023-04-25 DIAGNOSIS — R5382 Chronic fatigue, unspecified: Secondary | ICD-10-CM

## 2023-04-25 LAB — CBC WITH DIFFERENTIAL/PLATELET
Basophils Absolute: 0 10*3/uL (ref 0.0–0.1)
Basophils Relative: 0.6 % (ref 0.0–3.0)
Eosinophils Absolute: 0.1 10*3/uL (ref 0.0–0.7)
Eosinophils Relative: 1.3 % (ref 0.0–5.0)
HCT: 42.4 % (ref 36.0–46.0)
Hemoglobin: 14.3 g/dL (ref 12.0–15.0)
Lymphocytes Relative: 30.3 % (ref 12.0–46.0)
Lymphs Abs: 2.4 10*3/uL (ref 0.7–4.0)
MCHC: 33.8 g/dL (ref 30.0–36.0)
MCV: 88.2 fl (ref 78.0–100.0)
Monocytes Absolute: 0.7 10*3/uL (ref 0.1–1.0)
Monocytes Relative: 8.5 % (ref 3.0–12.0)
Neutro Abs: 4.6 10*3/uL (ref 1.4–7.7)
Neutrophils Relative %: 59.3 % (ref 43.0–77.0)
Platelets: 361 10*3/uL (ref 150.0–400.0)
RBC: 4.81 Mil/uL (ref 3.87–5.11)
RDW: 13.1 % (ref 11.5–15.5)
WBC: 7.8 10*3/uL (ref 4.0–10.5)

## 2023-04-25 LAB — HEPATIC FUNCTION PANEL
ALT: 37 U/L — ABNORMAL HIGH (ref 0–35)
AST: 35 U/L (ref 0–37)
Albumin: 4.2 g/dL (ref 3.5–5.2)
Alkaline Phosphatase: 74 U/L (ref 39–117)
Bilirubin, Direct: 0.1 mg/dL (ref 0.0–0.3)
Total Bilirubin: 0.6 mg/dL (ref 0.2–1.2)
Total Protein: 6.8 g/dL (ref 6.0–8.3)

## 2023-04-25 LAB — CORTISOL: Cortisol, Plasma: 5.9 ug/dL

## 2023-04-25 LAB — BASIC METABOLIC PANEL
BUN: 9 mg/dL (ref 6–23)
CO2: 29 meq/L (ref 19–32)
Calcium: 9.2 mg/dL (ref 8.4–10.5)
Chloride: 104 meq/L (ref 96–112)
Creatinine, Ser: 0.74 mg/dL (ref 0.40–1.20)
GFR: 107.45 mL/min (ref >60.00)
Glucose, Bld: 145 mg/dL — ABNORMAL HIGH (ref 70–99)
Potassium: 3.9 meq/L (ref 3.5–5.1)
Sodium: 138 meq/L (ref 135–145)

## 2023-04-25 LAB — HEMOGLOBIN A1C: Hgb A1c MFr Bld: 5.4 % (ref 4.6–6.5)

## 2023-04-25 LAB — TRIGLYCERIDES: Triglycerides: 178 mg/dL — ABNORMAL HIGH (ref 0.0–149.0)

## 2023-04-25 LAB — TSH: TSH: 1.21 u[IU]/mL (ref 0.35–5.50)

## 2023-04-25 MED ORDER — QULIPTA 60 MG PO TABS: 1.0000 | ORAL_TABLET | Freq: Every day | ORAL | 1 refills | Status: DC

## 2023-04-25 NOTE — Progress Notes (Unsigned)
 Subjective:  Patient ID: Catherine Villarreal, female    DOB: 1990-04-10  Age: 33 y.o. MRN: [id number]  CC: Headache   HPI [name] presents for f/up ---  Discussed the use of AI scribe software for clinical note transcription with the patient, who gave verbal consent to proceed.  History of Present Illness   Catherine Villarreal is a 33 year old female with migraines who presents with adverse reactions to [employer]  She experiences adverse reactions to [facility], which she uses for migraine prevention. The medication has reduced her migraine frequency from over twelve per month to ten to twelve per month. However, she experiences cold and flu-like symptoms two days after her monthly injection, including sinus-like symptoms, and on one occasion, she was diagnosed with strep throat requiring antibiotics. Her symptoms have resolved.  Her migraines occur in clusters, with episodes lasting three days in a row, followed by a week without migraines, and then another cluster of three to four days. She uses sumatriptan as needed, which is very effective, taking up to twelve pills per month.  She has a history of gastrointestinal issues, which have improved with Emgality due to its constipating effect. Previously, she experienced frequent diarrhea, particularly after consuming certain foods like dairy. She has identified specific foods that do not trigger her symptoms, such as lightly seasoned steak and plain baked potatoes.  She reports chronic fatigue, feeling exhausted even after sleeping for extended periods. Fatigue is consistent and impacts her daily activities. No dizziness or lightheadedness is noted.  She underwent a partial hysterectomy on December 11th due to pain, abnormal periods, and heavy cramping. Post-surgery, it was found that her uterus had thickened over time, although tests for endometriosis were negative.  She is a stepmother in a joint household with children from different areas, which  influences her decision not to receive flu vaccinations.       Outpatient Medications Prior to Visit  Medication Sig Dispense Refill   esomeprazole (NEXIUM) 40 MG capsule TAKE 1 CAPSULE(40 MG) BY MOUTH DAILY (Patient taking differently: Take 40 mg by mouth as needed.) 90 capsule 0   SUMAtriptan (IMITREX) 50 MG tablet TAKE 1 TABLET BY MOUTH EVERY 2 HOURS AS NEEDED FOR HEADACHE. IF PERSISTS MAY REPEAT IN 2HOURS FOR MIGRAINE(UP TO 200 MG IN 24 HOURS). 12 tablet 1   EMGALITY 120 MG/ML SOAJ INJECT 120 MG IN THE MUSCLE EVERY 30 DAYS. 6 mL 0   amoxicillin (AMOXIL) 875 MG tablet Take 1 tablet (875 mg total) by mouth 2 (two) times daily. 20 tablet 0   lidocaine (XYLOCAINE) 2 % solution Use as directed 10 mLs in the mouth or throat every 3 (three) hours as needed. 100 mL 0   No facility-administered medications prior to visit.    ROS Review of Systems  Objective:  BP 100/64 (BP Location: Left Arm, Patient Position: Sitting, Cuff Size: Normal)   Pulse 90   Temp 98.3 F (36.8 C) (Oral)   Ht 5\' 7"  (1.702 m)   Wt 179 lb 9.6 oz (81.5 kg)   LMP 01/10/2023 (Approximate)   SpO2 97%   BMI 28.13 kg/m   BP Readings from Last 3 Encounters:  04/25/23 100/64  03/28/23 108/77  03/07/23 102/71    Wt Readings from Last 3 Encounters:  04/25/23 179 lb 9.6 oz (81.5 kg)  03/28/23 175 lb 1.6 oz (79.4 kg)  02/22/23 171 lb 12.8 oz (77.9 kg)    Physical Exam  Lab Results  Component Value  Date   WBC 7.8 04/25/2023   HGB 14.3 04/25/2023   HCT 42.4 04/25/2023   PLT 361.0 04/25/2023   GLUCOSE 145 (H) 04/25/2023   CHOL 218 (H) 03/01/2021   TRIG 178.0 (H) 04/25/2023   HDL 34.90 (L) 03/01/2021   LDLDIRECT 124.0 03/01/2021   LDLCALC 113 (H) 05/25/2020   ALT 37 (H) 04/25/2023   AST 35 04/25/2023   NA 138 04/25/2023   K 3.9 04/25/2023   CL 104 04/25/2023   CREATININE 0.74 04/25/2023   BUN 9 04/25/2023   CO2 29 04/25/2023   TSH 1.21 04/25/2023   INR 1.0 03/14/2022   HGBA1C 5.4 04/25/2023    No  results found.  Assessment & Plan:  Pure hypertriglyceridemia -     Triglycerides; Future  Elevated LFTs -     Hepatic function panel; Future  Chronic hyperglycemia -     Hemoglobin A1c; Future  Chronic fatigue -     TSH; Future -     CBC with Differential/Platelet; Future -     Basic metabolic panel; Future -     Cortisol; Future  Idiopathic hypotension -     TSH; Future -     Hepatic function panel; Future -     CBC with Differential/Platelet; Future -     Basic metabolic panel; Future -     Cortisol; Future  Chronic migraine with aura without status migrainosus, not intractable -     Qulipta; Take 1 tablet (60 mg total) by mouth daily.  Dispense: 90 tablet; Refill: 1 -     Ambulatory referral to Neurology     Follow-up: Return in about 6 months (around 10/26/2023).  Sanda Linger, MD

## 2023-04-25 NOTE — Patient Instructions (Signed)

## 2023-04-26 ENCOUNTER — Encounter: Payer: Self-pay | Admitting: Neurology

## 2023-04-30 ENCOUNTER — Telehealth: Payer: Self-pay | Admitting: Pharmacy Technician

## 2023-04-30 ENCOUNTER — Other Ambulatory Visit (HOSPITAL_COMMUNITY): Payer: Self-pay

## 2023-04-30 NOTE — Telephone Encounter (Signed)
 Pharmacy Patient Advocate Encounter   Received notification from CoverMyMeds that prior authorization for QULIPTA 60MG  is required/requested.   Insurance verification completed.   The patient is insured through [facility] .   Per test claim: [id number]  Submitted and pending

## 2023-05-02 NOTE — Telephone Encounter (Signed)
 Pharmacy Patient Advocate Encounter  Received notification from [facility] that Prior Authorization for QULIPTA 60MG  TABLETS has been DENIED.  Full denial letter will be uploaded to the media tab. See denial reason below.   PA #/Case ID/Reference #: [id number]

## 2023-05-10 NOTE — Telephone Encounter (Signed)
 Patient has been made aware.

## 2023-05-10 NOTE — Telephone Encounter (Signed)
 Copied from CRM [id number]. Topic: General - Other >> May 10, 2023  3:18 PM Jon Gills C wrote: Reason for CRM: Patient called in stated she got a medication decision for Atogepant (QULIPTA) 60 MG TABS was denied because the letter stated there were two other medications to try before that one   Aimovig Ajovy
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
## 2023-05-16 ENCOUNTER — Telehealth: Payer: Medicaid Other | Admitting: Psychiatry

## 2023-05-16 ENCOUNTER — Encounter: Payer: Self-pay | Admitting: Psychiatry

## 2023-05-16 DIAGNOSIS — F330 Major depressive disorder, recurrent, mild: Secondary | ICD-10-CM | POA: Diagnosis not present

## 2023-06-01 ENCOUNTER — Other Ambulatory Visit: Payer: Self-pay | Admitting: Internal Medicine

## 2023-06-01 DIAGNOSIS — G43009 Migraine without aura, not intractable, without status migrainosus: Secondary | ICD-10-CM

## 2023-06-14 NOTE — Progress Notes (Unsigned)
 NEUROLOGY CONSULTATION NOTE  Catherine Villarreal MRN: [id number] DOB: [birth year]  Referring provider: Sandra Crouch, MD Primary care provider: Sandra Crouch, MD  Reason for consult:  migraines  Assessment/Plan:   Migraine without aura, without status migrainosus, not intractable  Migraine prevention:  Start Aimovig 140mg  every 28 days Migraine rescue:  She will try samples of Ubrelvy  100mg  and update me Limit use of pain relievers to no more than 9 days out of the month to prevent risk of rebound or medication-overuse headache. Keep headache diary Follow up in 6 months.    Subjective:  Catherine Villarreal is a 32 year old right-handed female with GAD, MDD and history of kidney stone who presents for migraines.  History supplemented by referring provider's note.  MRI of brain from 03/2022 personally reviewed.  Onset:  2016 when she was pregnant with her son and progressively got worse afterwards.  2020, changed to having migraine features.  Initially a couple of times a month and now chronic. Location:  right retro-orbital, base of skull; if not as severe, facial and bilateral periorbital Quality:  pressure, pounding Intensity:  5/10 (if takes medication).  She denies new headache, thunderclap headache Aura:  absent Prodrome:  absent Associated symptoms:  Nausea, rarely vomiting, either photophobia OR phonophobia, sometimes difficulty getting words out/stutter, on occasion blurred vision.  She denies associated unilateral numbness or weakness. Duration:  2 to 4 hours with sumatriptan  but for 2-3 days consecutively Frequency:  10 to 12 days a month Frequency of abortive medication: 10-12 days a month Triggers:  night driving/headlight glare, air conditioning in car, anxiety Relieving factors:  sumatriptan , sleep, heating pad, cold pack Activity:  activity aggravates them.    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  She reports that she  had a recent MRI not in the system/report and imaging not available for review.    Past NSAIDS/analgesics:  Fioricet , naproxen , ibuprofen , acetaminophen  Past abortive triptans:  rizatriptan  10mg , sumatriptan  50mg  Past abortive ergotamine:  none Past muscle relaxants:  none Past anti-emetic:  Zofran , Phenergan  25mg  Past antihypertensive medications:  none Past antidepressant medications:  nortriptyline , venlafaxine , sertraline, escitalopram Past anticonvulsant medications:  topiramate , lamotrigine  Past anti-CGRP:  Emgality  (side effects - flu like symptoms, rash), Nurtec PRN (ineffective) Past vitamins/Herbal/Supplements:  none Past antihistamines/decongestants:  none Other past therapies:  none  Current NSAIDS/analgesics:  Excedrin (occasionally) Current triptans:  none Current ergotamine:  none Current anti-emetic:  none Current muscle relaxants:  none Current Antihypertensive medications:  none Current Antidepressant medications:  none Current Anticonvulsant medications:  none Current anti-CGRP:  none Current Vitamins/Herbal/Supplements:  none Current Antihistamines/Decongestants:  none Other therapy:  none Birth control:  no   Caffeine :  Energy drink daily.  No coffee.  Tried caffeine  cessation.  No improvement Alcohol:  rarely Diet:  Tried diet modification.  No improvement.  Drinks 60 oz water daily.  Rarely soda.  Tries not to skip meals.   Exercise:  not routine Depression:  diagnosed; Anxiety:  yes Sleep hygiene:  good.  7 to 8 hours a night.  Other history:  partial hysterectomy.  No history of head trauma. Mother to 4 children Family history of headache:  Mom (migraines)      PAST MEDICAL HISTORY: Past Medical History:  Diagnosis Date   Abnormal uterine bleeding (AUB)    Chronic pelvic pain in female    GAD (generalized anxiety disorder)    GERD (gastroesophageal reflux disease)  Left nephrolithiasis    urologist--- dr r.  davis;   last ultrasound in  care everywhere 01-04-2023 .  nonobstructive   MDD (major depressive disorder)    Migraines     PAST SURGICAL HISTORY: Past Surgical History:  Procedure Laterality Date   CYSTOSCOPY N/A 01/31/2023   Procedure: CYSTOSCOPY;  Surgeon: Kiki Pelton, MD;  Location: [facility];  Service: Gynecology;  Laterality: N/A;   LAPAROSCOPIC CHOLECYSTECTOMY  2017   dr gross   TOTAL LAPAROSCOPIC HYSTERECTOMY WITH SALPINGECTOMY Bilateral 01/31/2023   Procedure: TOTAL LAPAROSCOPIC HYSTERECTOMY WITH SALPINGECTOMY;  Surgeon: Kiki Pelton, MD;  Location: [facility];  Service: Gynecology;  Laterality: Bilateral;    MEDICATIONS: Current Outpatient Medications on File Prior to Visit  Medication Sig Dispense Refill   Atogepant  (QULIPTA ) 60 MG TABS Take 1 tablet (60 mg total) by mouth daily. 90 tablet 1   esomeprazole  (NEXIUM ) 40 MG capsule TAKE 1 CAPSULE(40 MG) BY MOUTH DAILY (Patient taking differently: Take 40 mg by mouth as needed.) 90 capsule 0   SUMAtriptan  (IMITREX ) 50 MG tablet TAKE 1 TABLET BY MOUTH EVERY 2 HOURS AS NEEDED FOR HEADACHE. IF PERSISTS MAY REPEAT IN 2 HOURS FOR MIGRAINE (UP TO 200 MG IN 24 HOURS) 12 tablet 0   No current facility-administered medications on file prior to visit.    ALLERGIES: Allergies  Allergen Reactions   Topamax  [Topiramate ] Other (See Comments)    Headaches worsened   Dilaudid  [Hydromorphone  Hcl]     migraine   Adhesive [Tape] Rash    Worse with time, paper tape better    FAMILY HISTORY: Family History  Problem Relation Age of Onset   Cancer Mother    Breast cancer Mother        pt states diagnosed at age 69/40   Thyroid  cancer Mother    Other Mother    Thyroid  disease Mother    Murrell Arrant' disease Mother    Stroke Father    Thyroid  disease Maternal Aunt    Thyroid  disease Maternal Grandmother    Drug abuse Brother     Objective:  Blood pressure 103/73, pulse 67, height 5\' 7"  (1.702 m), weight 181 lb 9.6 oz  (82.4 kg), last menstrual period 01/10/2023, SpO2 98%. General: No acute distress.  Patient appears well-groomed.   Head:  Normocephalic/atraumatic Eyes:  fundi examined but not visualized Neck: supple, no paraspinal tenderness, full range of motion Back: No paraspinal tenderness Heart: regular rate and rhythm Lungs: Clear to auscultation bilaterally. Vascular: No carotid bruits. Neurological Exam: Mental status: alert and oriented to person, place, and time, speech fluent and not dysarthric, language intact. Cranial nerves: CN I: not tested CN II: pupils equal, round and reactive to light, visual fields intact CN III, IV, VI:  full range of motion, no nystagmus, no ptosis CN V: facial sensation intact. CN VII: upper and lower face symmetric CN VIII: hearing intact CN IX, X: gag intact, uvula midline CN XI: sternocleidomastoid and trapezius muscles intact CN XII: tongue midline Bulk & Tone: normal, no fasciculations. Motor:  muscle strength 5/5 throughout Sensation:  Pinprick, temperature and vibratory sensation intact. Deep Tendon Reflexes:  2+ throughout,  toes downgoing.   Finger to nose testing:  Without dysmetria.   Heel to shin:  Without dysmetria.   Gait:  Normal station and stride.  Romberg negative.    Thank you for allowing me to take part in the care of this patient.  Janne Members, DO  CC: Sandra Crouch, MD

## 2023-06-24 ENCOUNTER — Other Ambulatory Visit: Payer: Self-pay | Admitting: Internal Medicine

## 2023-06-24 DIAGNOSIS — G43009 Migraine without aura, not intractable, without status migrainosus: Secondary | ICD-10-CM

## 2023-06-25 ENCOUNTER — Encounter: Payer: Self-pay | Admitting: Neurology

## 2023-06-25 ENCOUNTER — Ambulatory Visit: Admitting: Neurology

## 2023-06-25 VITALS — BP 103/73 | HR 67 | Ht 67.0 in | Wt 181.6 lb

## 2023-06-25 DIAGNOSIS — G43009 Migraine without aura, not intractable, without status migrainosus: Secondary | ICD-10-CM

## 2023-06-25 MED ORDER — AIMOVIG 140 MG/ML ~~LOC~~ SOAJ: 140.0000 mg | SUBCUTANEOUS | 5 refills | Status: DC

## 2023-06-25 NOTE — Patient Instructions (Addendum)
 3 Start Aimovig 140mg  every 28 days.  Contact us  in 3 months with update and we can increase dose if needed. Take Ubrelvy  100mg  at earliest onset of headache.  May repeat dose once in 2 hours if needed.  Maximum 2 tablets in 24 hours.  LET ME KNOW IF IT WORKS Limit use of pain relievers to no more than 9 days out of the month.  These medications include acetaminophen , NSAIDs (ibuprofen /Advil /Motrin , naproxen /Aleve , triptans (Imitrex /sumatriptan ), Excedrin, and narcotics.  This will help reduce risk of rebound headaches. Be aware of common food triggers: Routine exercise Stay adequately hydrated (aim for 64 oz water daily) Keep headache diary Maintain proper stress management Maintain proper sleep hygiene Do not skip meals

## 2023-06-25 NOTE — Progress Notes (Unsigned)
 Medication Samples have been provided to the patient.  Drug name: Ubrelvy        Strength: 100 mg        Qty: 4  LOT: [id number]  Exp.Date: 12/26  Dosing instructions: as needed  The patient has been instructed regarding the correct time, dose, and frequency of taking this medication, including desired effects and most common side effects.   Michalene Agee 8:35 AM 06/25/2023

## 2023-06-26 ENCOUNTER — Telehealth: Payer: Self-pay

## 2023-06-26 NOTE — Telephone Encounter (Signed)
 PA needed for Aimovig 140 mg

## 2023-06-27 ENCOUNTER — Other Ambulatory Visit (HOSPITAL_COMMUNITY): Payer: Self-pay

## 2023-06-27 ENCOUNTER — Telehealth: Payer: Self-pay

## 2023-06-27 NOTE — Telephone Encounter (Signed)
 PA request has been Submitted. New Encounter has been or will be created for follow up. For additional info see Pharmacy Prior Auth telephone encounter from 06-27-2023.

## 2023-06-27 NOTE — Telephone Encounter (Signed)
 Pharmacy Patient Advocate Encounter   Received notification from Pt Calls Messages that prior authorization for Aimovig 140MG /ML auto-injectors is required/requested.   Insurance verification completed.   The patient is insured through [facility] .   Per test claim: PA required; PA submitted to above mentioned insurance via CoverMyMeds Key/confirmation #/EOC [id number] Status is pending

## 2023-06-27 NOTE — Telephone Encounter (Signed)
 Pharmacy Patient Advocate Encounter  Received notification from [facility] that Prior Authorization for Aimovig 140MG /ML auto-injectors has been APPROVED from 06-27-2023 to 09-27-2023   PA #/Case ID/Reference #: [facility]

## 2023-07-02 ENCOUNTER — Ambulatory Visit: Payer: Medicaid Other | Admitting: Psychology

## 2023-07-03 ENCOUNTER — Other Ambulatory Visit: Payer: Self-pay | Admitting: Neurology

## 2023-07-03 ENCOUNTER — Telehealth: Payer: Self-pay | Admitting: Neurology

## 2023-07-03 ENCOUNTER — Encounter: Payer: Self-pay | Admitting: Neurology

## 2023-07-03 DIAGNOSIS — G43009 Migraine without aura, not intractable, without status migrainosus: Secondary | ICD-10-CM

## 2023-07-03 MED ORDER — SUMATRIPTAN SUCCINATE 50 MG PO TABS: ORAL_TABLET | ORAL | 5 refills | Status: DC

## 2023-07-03 NOTE — Telephone Encounter (Signed)
 Patient states that the ubrelvy  samples did not work. She would like to see if Dr Festus Hubert can call her in the medication Sumatriptan  that works for her. [facility] uses the [facility] in [place]   Pt also sent in a mychart message about this as well

## 2023-07-03 NOTE — Telephone Encounter (Signed)
 Please see mychart message.

## 2023-08-09 ENCOUNTER — Encounter: Payer: Self-pay | Admitting: Obstetrics and Gynecology

## 2023-08-09 ENCOUNTER — Other Ambulatory Visit: Payer: Self-pay | Admitting: Lactation Services

## 2023-08-09 MED ORDER — FLUCONAZOLE 150 MG PO TABS: 150.0000 mg | ORAL_TABLET | Freq: Once | ORAL | 0 refills | Status: AC

## 2023-08-09 NOTE — Progress Notes (Signed)
 Diflucan  ordered for s/s yeast infection per standing order.
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
## 2023-09-12 ENCOUNTER — Telehealth: Admitting: Psychiatry

## 2023-10-15 ENCOUNTER — Telehealth: Payer: Self-pay | Admitting: Neurology

## 2023-10-15 NOTE — Telephone Encounter (Signed)
 Pt said her pharmacy did not let her know her medication needs another PA. She called the insurance.  Aimovig  Pharmacy- [facility] [place] Patient is out of medication since the pharmacy didn't send the PA over.  She has 3 emergency medication She said if you need to talk with her you can mychart her

## 2023-10-15 NOTE — Telephone Encounter (Signed)
 Myhcart message sent.     Pa team please start a PA for Aimovig 

## 2023-10-18 ENCOUNTER — Telehealth: Payer: Self-pay

## 2023-10-18 ENCOUNTER — Other Ambulatory Visit (HOSPITAL_COMMUNITY): Payer: Self-pay

## 2023-10-18 NOTE — Telephone Encounter (Signed)
 Pharmacy Patient Advocate Encounter   Received notification from Pt Calls Messages that prior authorization for Aimovig  140 is due for renewal.   Insurance verification completed.   The patient is insured through [facility].  Action: PA required; PA submitted to above mentioned insurance via Latent Key/confirmation #/EOC [id number] Status is pending

## 2023-10-18 NOTE — Telephone Encounter (Signed)
 I submitted provider note 06/25/23 that states partial hysterectomy.  Pharmacy Patient Advocate Encounter  Received notification from [employer] that Prior Authorization for Aimovig  140 has been DENIED.  Full denial letter will be uploaded to the media tab. See denial reason below.     PA #/Case ID/Reference #: # [id number]

## 2023-10-19 ENCOUNTER — Telehealth: Payer: Self-pay | Admitting: Neurology

## 2023-10-19 DIAGNOSIS — G43009 Migraine without aura, not intractable, without status migrainosus: Secondary | ICD-10-CM | POA: Diagnosis not present

## 2023-10-19 LAB — HCG, SERUM, QUALITATIVE: Preg, Serum: NEGATIVE

## 2023-10-19 NOTE — Telephone Encounter (Signed)
 Lab order faxed to [facility] near patient. She will try and go today.

## 2023-10-19 NOTE — Telephone Encounter (Signed)
 Pt called in this morning and she stated that she is being denied for Aimovig  140 from her insurance. Pt stated that the insurance needs a Prior Authorization for the prescription now.   Received notification from [facility] that Prior Authorization for Aimovig  140 has been DENIED.

## 2023-10-19 NOTE — Telephone Encounter (Signed)
 Please see denial note 10/15/23.  Patient to have blood drawn for pregnancy test.  Will send results to insurance once received to have Denial over turned.

## 2023-10-23 ENCOUNTER — Telehealth: Payer: Self-pay | Admitting: Neurology

## 2023-10-23 NOTE — Telephone Encounter (Signed)
 Pt called in today and she stated that the test results came back in from  her pregnancy test. Thanks

## 2023-10-25 ENCOUNTER — Telehealth: Payer: Self-pay | Admitting: Neurology

## 2023-10-25 NOTE — Telephone Encounter (Signed)
 Dr. Skeet office is now taking over this PA please close the PA from our office.

## 2023-10-25 NOTE — Telephone Encounter (Signed)
 Pt said she is needing the PA for Aimovig . When the PA is submitted please call her and let her know so she will be able to call her insurance again. It keeps getting denied and she is two weeks behind

## 2023-10-26 ENCOUNTER — Telehealth: Payer: Self-pay

## 2023-10-26 ENCOUNTER — Other Ambulatory Visit (HOSPITAL_COMMUNITY): Payer: Self-pay

## 2023-10-26 NOTE — Telephone Encounter (Signed)
 Pharmacy Patient Advocate Encounter   Received notification from Pt Calls Messages that prior authorization for Aimovig  140MG /ML auto-injectors is required/requested.   Insurance verification completed.   The patient is insured through [facility].   Per test claim: PA required; PA submitted to above mentioned insurance via Latent Key/confirmation #/EOC [id number] Status is pending
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
## 2023-10-30 NOTE — Telephone Encounter (Signed)
 Pharmacy Patient Advocate Encounter  Received notification from [facility] that Prior Authorization for Aimovig  140MG /ML auto-injectors has been APPROVED from 10-26-2023 to 10-25-2024   PA #/Case ID/Reference #: [id number]

## 2023-10-31 ENCOUNTER — Telehealth (INDEPENDENT_AMBULATORY_CARE_PROVIDER_SITE_OTHER): Admitting: Psychiatry

## 2023-10-31 ENCOUNTER — Encounter: Payer: Self-pay | Admitting: Psychiatry

## 2023-10-31 ENCOUNTER — Telehealth: Payer: Self-pay

## 2023-10-31 DIAGNOSIS — F330 Major depressive disorder, recurrent, mild: Secondary | ICD-10-CM

## 2023-10-31 MED ORDER — DULOXETINE HCL 20 MG PO CPEP: 20.0000 mg | ORAL_CAPSULE | Freq: Every day | ORAL | 1 refills | Status: DC

## 2023-10-31 NOTE — Patient Instructions (Signed)
 Start duloxetine  20 mg daily  Next appointment- 10/29 at 1 pm

## 2023-10-31 NOTE — Telephone Encounter (Signed)
 Copied from CRM #[id number]. Topic: Referral - Request for Referral >> Oct 31, 2023  9:12 AM Carlyon D wrote: Did the patient discuss referral with their provider in the last year? Yes (If No - schedule appointment) (If Yes - send message)  Appointment offered? Yes  Type of order/referral and detailed reason for visit: Endocrinologist   Preference of office, provider, location: Open, would prefer [place] or surrounding area or [place]   If referral order, have you been seen by this specialty before? No (If Yes, this issue or another issue? When? Where?  Can we respond through MyChart? Yes

## 2023-11-02 NOTE — Telephone Encounter (Signed)
 Patient has been scheduled for an appointment to have this referral placed.

## 2023-11-04 NOTE — Progress Notes (Signed)
 Acute Office Visit  Subjective:     Patient ID: Catherine Villarreal, female    DOB: 25-Jun-1990, 33 y.o.   MRN: [id number]  No chief complaint on file.   HPI  Discussed the use of AI scribe software for clinical note transcription with the patient, who gave verbal consent to proceed.  History of Present Illness Catherine Villarreal is a 33 year old female who presents with a referral for endocrinology evaluation due to persistent symptoms suggestive of a hormonal imbalance.  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 - Aimovig  previously effective but interrupted due to insurance issues - Sumatriptan  provides relief but causes side effects - No palpitations except when using rizatriptan , which has been discontinued  Gastrointestinal symptoms - Alternating diarrhea and constipation  Musculoskeletal symptoms - Joint pain  Gynecologic and hormonal history - Partial hysterectomy in December with ovaries retained - Partial hysterectomy alleviated severe menstrual periods but did not improve other symptoms - Family history of breast cancer influenced decision to retain ovaries - History of pelvic floor pain previously managed with copper  IUD - Mother has Hashimoto's disease - Previously used MP thyroid  and progesterone  with benefit, but both were discontinued by a prior provider - Thyroid  function tests normal     ROS Per HPI      Objective:    BP 110/78 (BP Location: Left Arm, Patient Position: Sitting, Cuff Size: Normal)   Pulse 97   Temp 97.6 F (36.4 C) (Temporal)   Ht 5' 7 (1.702 m)    Wt 178 lb 9.6 oz (81 kg)   LMP 01/10/2023 (Approximate)   SpO2 97%   BMI 27.97 kg/m    Physical Exam Vitals and nursing note reviewed.  Constitutional:      General: She is not in acute distress.    Comments: Appears fatigued  HENT:     Head: Normocephalic and atraumatic.     Right Ear: External ear normal.     Left Ear: External ear normal.     Nose: Nose normal.  Eyes:     Extraocular Movements: Extraocular movements intact.     Pupils: Pupils are equal, round, and reactive to light.  Cardiovascular:     Rate and Rhythm: Normal rate and regular rhythm.     Pulses: Normal pulses.     Heart sounds: Normal heart sounds. No murmur heard. Pulmonary:     Effort: Pulmonary effort is normal. No respiratory distress.     Breath sounds: Normal breath sounds. No wheezing, rhonchi or rales.  Musculoskeletal:        General: Normal range of motion.     Cervical back: Normal range of motion.     Right lower leg: No edema.     Left lower leg: No edema.  Lymphadenopathy:     Cervical: No cervical adenopathy.  Skin:    General: Skin is warm and dry.  Neurological:     General: No focal deficit present.     Mental Status: She is alert and oriented to person, place, and time.  Psychiatric:        Mood and Affect: Mood normal.  Thought Content: Thought content normal.     No results found for any visits on 11/05/23.      Assessment & Plan:   Assessment and Plan Assessment & Plan Family History of Hashimoto's Suspected Hashimoto's Thyroiditis Symptoms suggestive of Hashimoto's Thyroiditis despite normal TSH and T3/T4 levels. Family history and partial hysterectomy complicate hormonal assessments. Comprehensive thyroid  antibody tests and inflammatory markers needed for endocrinology referral. - Order thyroid  antibody tests and inflammatory markers. - Refer to endocrinology for further evaluation.  Chronic Fatigue Chronic fatigue not alleviated by sleep, worsens after  eating, impacts daily functioning. Previous sleep studies inconclusive. Evaluation of thyroid  function and inflammatory markers part of workup. - Evaluate thyroid  function and inflammatory markers. - Consider referral to functional medicine if conventional workup is inconclusive.  Chronic Migraines Chronic migraines resistant to treatment. Aimovig  interrupted by insurance issues. Sumatriptan  provides relief but causes side effects. Pineal cyst relevance unclear. Neurology follow-up scheduled. - Continue Aimovig  for migraine prevention. - Continue sumatriptan  as needed for acute migraine relief. - Consider Cefaly device for non-pharmacological management. - Follow up with neurology in November or December.  Chronic Joint Pain, bloating - CBC, CMP, CRP, sed rate  GAD, MDD, recurrent, moderate - not well controlled - seeing psych in [place], [place] - has not started cymbalta  yet, wary of possible side effects  General Health Maintenance Proactive in managing health, open to conventional and functional medicine. Discussed functional medicine benefits and limitations, noting insurance coverage issues. - Discuss the potential benefits and limitations of functional medicine. - Provide information on functional medicine practitioners.  Follow-up Requires follow-up for ongoing evaluation and management. Coordination with specialists necessary. - Ensure endocrinology referral is processed. - Monitor for updates from neurology follow-up.     Orders Placed This Encounter  Procedures   CBC with Differential/Platelet    Release to patient:   Immediate [1]   Comprehensive metabolic panel with GFR    Release to patient:   Immediate [1]   C-reactive protein   Sedimentation rate   Thyroid  Profile   TSH   Vitamin B12   VITAMIN D  25 Hydroxy (Vit-D Deficiency, Fractures)   Progesterone    Estrogens , Total   Testosterone    Thyroid  peroxidase antibody   Ambulatory referral to Endocrinology     Referral Priority:   Routine    Referral Type:   Consultation    Referral Reason:   Specialty Services Required    Number of Visits Requested:   1     No orders of the defined types were placed in this encounter.   Return if symptoms worsen or fail to improve.  Corean LITTIE Ku, FNP

## 2023-11-05 ENCOUNTER — Ambulatory Visit: Admitting: Family Medicine

## 2023-11-05 ENCOUNTER — Encounter: Payer: Self-pay | Admitting: Family Medicine

## 2023-11-05 VITALS — BP 110/78 | HR 97 | Temp 97.6°F | Ht 67.0 in | Wt 178.6 lb

## 2023-11-05 DIAGNOSIS — M2559 Pain in other specified joint: Secondary | ICD-10-CM | POA: Diagnosis not present

## 2023-11-05 DIAGNOSIS — F411 Generalized anxiety disorder: Secondary | ICD-10-CM | POA: Diagnosis not present

## 2023-11-05 DIAGNOSIS — R5382 Chronic fatigue, unspecified: Secondary | ICD-10-CM | POA: Diagnosis not present

## 2023-11-05 DIAGNOSIS — G43009 Migraine without aura, not intractable, without status migrainosus: Secondary | ICD-10-CM | POA: Diagnosis not present

## 2023-11-05 DIAGNOSIS — Z8349 Family history of other endocrine, nutritional and metabolic diseases: Secondary | ICD-10-CM | POA: Diagnosis not present

## 2023-11-05 DIAGNOSIS — F331 Major depressive disorder, recurrent, moderate: Secondary | ICD-10-CM | POA: Diagnosis not present

## 2023-11-05 DIAGNOSIS — M2550 Pain in unspecified joint: Secondary | ICD-10-CM | POA: Insufficient documentation

## 2023-11-05 DIAGNOSIS — R140 Abdominal distension (gaseous): Secondary | ICD-10-CM

## 2023-11-05 LAB — COMPREHENSIVE METABOLIC PANEL WITH GFR
ALT: 17 U/L (ref 0–35)
AST: 23 U/L (ref 0–37)
Albumin: 4.4 g/dL (ref 3.5–5.2)
Alkaline Phosphatase: 74 U/L (ref 39–117)
BUN: 6 mg/dL (ref 6–23)
CO2: 28 meq/L (ref 19–32)
Calcium: 9.7 mg/dL (ref 8.4–10.5)
Chloride: 103 meq/L (ref 96–112)
Creatinine, Ser: 0.75 mg/dL (ref 0.40–1.20)
GFR: 105.34 mL/min (ref >60.00)
Glucose, Bld: 97 mg/dL (ref 70–99)
Potassium: 4.1 meq/L (ref 3.5–5.1)
Sodium: 138 meq/L (ref 135–145)
Total Bilirubin: 0.4 mg/dL (ref 0.2–1.2)
Total Protein: 7.2 g/dL (ref 6.0–8.3)

## 2023-11-05 LAB — VITAMIN B12: Vitamin B-12: 603 pg/mL (ref 211–911)

## 2023-11-05 LAB — CBC WITH DIFFERENTIAL/PLATELET
Basophils Absolute: 0 K/uL (ref 0.0–0.1)
Basophils Relative: 0.7 % (ref 0.0–3.0)
Eosinophils Absolute: 0.1 K/uL (ref 0.0–0.7)
Eosinophils Relative: 1.4 % (ref 0.0–5.0)
HCT: 44.2 % (ref 36.0–46.0)
Hemoglobin: 14.8 g/dL (ref 12.0–15.0)
Lymphocytes Relative: 35.6 % (ref 12.0–46.0)
Lymphs Abs: 2.7 K/uL (ref 0.7–4.0)
MCHC: 33.5 g/dL (ref 30.0–36.0)
MCV: 88.2 fl (ref 78.0–100.0)
Monocytes Absolute: 0.6 K/uL (ref 0.1–1.0)
Monocytes Relative: 7.3 % (ref 3.0–12.0)
Neutro Abs: 4.2 K/uL (ref 1.4–7.7)
Neutrophils Relative %: 55 % (ref 43.0–77.0)
Platelets: 344 K/uL (ref 150.0–400.0)
RBC: 5.01 Mil/uL (ref 3.87–5.11)
RDW: 12.7 % (ref 11.5–15.5)
WBC: 7.6 K/uL (ref 4.0–10.5)

## 2023-11-05 LAB — TESTOSTERONE: Testosterone: 48.23 ng/dL — ABNORMAL HIGH (ref 15.00–40.00)

## 2023-11-05 LAB — VITAMIN D 25 HYDROXY (VIT D DEFICIENCY, FRACTURES): VITD: 43.23 ng/mL (ref 30.00–100.00)

## 2023-11-05 LAB — C-REACTIVE PROTEIN: CRP: <1 mg/dL (ref 0.5–20.0)

## 2023-11-05 LAB — SEDIMENTATION RATE: Sed Rate: 10 mm/h (ref 0–20)

## 2023-11-05 LAB — TSH: TSH: 1.37 u[IU]/mL (ref 0.35–5.50)

## 2023-11-05 NOTE — Patient Instructions (Addendum)
 Cefaly device for headaches to possibly treat migraines  Dr Sharyne Pacini with [facility]  [phone]  We are checking labs today, will be in contact with any results that require further attention.  We will follow up from lab results.   I have placed a referral to endocrinology for you today.   Will restart progesterone  if needed

## 2023-11-06 ENCOUNTER — Ambulatory Visit: Payer: Self-pay | Admitting: Family Medicine

## 2023-11-06 LAB — THYROID PANEL
Free Thyroxine Index: 1.9 (ref 1.2–4.9)
T3 Uptake Ratio: 23 % — ABNORMAL LOW (ref 24–39)
T4, Total: 8.4 ug/dL (ref 4.5–12.0)

## 2023-11-09 LAB — THYROID PEROXIDASE ANTIBODY: Thyroperoxidase Ab SerPl-aCnc: <1 [IU]/mL (ref <9)

## 2023-11-09 LAB — ESTROGENS, TOTAL: Estrogen: 544 pg/mL

## 2023-11-09 LAB — PROGESTERONE: Progesterone: 0.5 ng/mL

## 2023-11-12 MED ORDER — PROGESTERONE 200 MG PO CAPS: 200.0000 mg | ORAL_CAPSULE | Freq: Every day | ORAL | 1 refills | Status: DC

## 2023-11-22 ENCOUNTER — Other Ambulatory Visit: Payer: Self-pay | Admitting: Family Medicine

## 2023-11-22 MED ORDER — PROGESTERONE MICRONIZED 100 MG PO CAPS: 100.0000 mg | ORAL_CAPSULE | Freq: Every day | ORAL | 1 refills | Status: AC

## 2023-11-26 ENCOUNTER — Other Ambulatory Visit: Payer: Self-pay | Admitting: Family Medicine

## 2023-11-26 DIAGNOSIS — R7989 Other specified abnormal findings of blood chemistry: Secondary | ICD-10-CM | POA: Insufficient documentation

## 2023-11-26 NOTE — Telephone Encounter (Signed)
**Note De-identified  [place] Obfuscation** Please advise 

## 2023-11-26 NOTE — Telephone Encounter (Unsigned)
 Copied from CRM #[id number]. Topic: Referral - Question >> Nov 26, 2023  3:54 PM Martinique E wrote: Reason for CRM: Patient called stating that the endocrinology office needs an updated referral with patient's diagnosis of elevated testosterone  and a hormone imbalance, the referral needs to reflect this in order for patient to get scheduled at that specialty.

## 2023-11-26 NOTE — Telephone Encounter (Signed)
Can you do this for this patient?

## 2023-11-27 NOTE — Telephone Encounter (Signed)
 Patient has been made aware and gave a verbal understanding.

## 2023-12-13 ENCOUNTER — Other Ambulatory Visit: Payer: Self-pay | Admitting: Internal Medicine

## 2023-12-13 DIAGNOSIS — G43009 Migraine without aura, not intractable, without status migrainosus: Secondary | ICD-10-CM
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
## 2023-12-17 ENCOUNTER — Other Ambulatory Visit: Payer: Self-pay | Admitting: Neurology

## 2023-12-19 ENCOUNTER — Telehealth: Admitting: Psychiatry

## 2023-12-19 ENCOUNTER — Encounter: Payer: Self-pay | Admitting: Psychiatry

## 2023-12-19 DIAGNOSIS — F330 Major depressive disorder, recurrent, mild: Secondary | ICD-10-CM | POA: Diagnosis not present

## 2023-12-19 NOTE — Patient Instructions (Addendum)
 Hold duloxetine  Next appointment- 1/21 at 1 20  Here are a couple of links to explore the condition called Myalgic Encephalomyelitis (ME). Please let me know if you're interested in a referral to [facility].  I've included that link at the end.  Myalgic Encephalomyelitis (ME) [email or web address] [email or web address]-[email or web address]://www.cdc.gov/me-cfs/pdfs/could-you-have-mecfs_[email or web address]  [facility] clinic [email or web address]

## 2023-12-26 NOTE — Progress Notes (Unsigned)
 NEUROLOGY FOLLOW UP OFFICE NOTE  Catherine Villarreal [id number]  Assessment/Plan:   Migraine without aura, without status migrainosus, not intractable  Migraine prevention:  Plan to change from Aimovig  to Qulipta  60mg  daily. Migraine rescue:  Increase sumatriptan  to 100mg .  Will have her try samples of Zavzpret NS as second line if needed. Limit use of pain relievers to no more than 9 days out of the month to prevent risk of rebound or medication-overuse headache. Keep headache diary Follow up in 7 months.    Subjective:  Catherine Villarreal is a 33 year old right-handed female with GAD, MDD and history of kidney stone who follows up for migraines.  UPDATE: Started Aimovig .  Reports difficulty picking up her medications for the past couple of months.  Therefore, for the last 2 months, there has been a delay in taking it.    Intensity:  5/10 (worse if not treated) Duration:  30-60 minutes with sumatriptan  50mg  for 75% of time, ineffective other 25% of time Frequency:  12-13 days a month Frequency of abortive medication: 12 days a month Current NSAIDS/analgesics:  none Current triptans:  sumatriptan  50mg  Current ergotamine:  none Current anti-emetic:  none Current muscle relaxants:  none Current Antihypertensive medications:  none Current Antidepressant medications:  none Current Anticonvulsant medications:  none Current anti-CGRP:  Aimovig  140mg  Current Vitamins/Herbal/Supplements:  none Current Antihistamines/Decongestants:  none Other therapy:  none    Caffeine :  Energy drink daily.  No coffee.  Tried caffeine  cessation.  No improvement Alcohol:  rarely Diet:  Tried diet modification.  No improvement.  Drinks 60 oz water daily.  Rarely soda.  Tries not to skip meals.   Exercise:  not routine Depression:  diagnosed; Anxiety:  yes Sleep hygiene:  good.  7 to 8 hours a night.  HISTORY: Onset:  2016 when she was pregnant with her son and progressively got worse afterwards.   2020, changed to having migraine features.  Initially a couple of times a month and now chronic. Location:  right retro-orbital, base of skull; if not as severe, facial and bilateral periorbital Quality:  pressure, pounding Intensity:  5/10 (if takes medication).  She denies new headache, thunderclap headache Aura:  absent Prodrome:  absent Associated symptoms:  Nausea, rarely vomiting, either photophobia OR phonophobia, sometimes difficulty getting words out/stutter, on occasion blurred vision.  She denies associated unilateral numbness or weakness. Duration:  2 to 4 hours with sumatriptan  but for 2-3 days consecutively Frequency:  10 to 12 days a month Frequency of abortive medication: 10-12 days a month Triggers:  night driving/headlight glare, air conditioning in car, anxiety Relieving factors:  sumatriptan , sleep, heating pad, cold pack Activity:  activity aggravates them.    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  She reports that she had a recent MRI not in the system/report and imaging not available for review.    Past NSAIDS/analgesics:  Fioricet , naproxen , ibuprofen , acetaminophen , Excedrin Past abortive triptans:  rizatriptan  10mg  Past abortive ergotamine:  none Past muscle relaxants:  none Past anti-emetic:  Zofran , Phenergan  25mg  Past antihypertensive medications:  none Past antidepressant medications:  nortriptyline , venlafaxine , sertraline, escitalopram Past anticonvulsant medications:  topiramate , lamotrigine  Past anti-CGRP:  Emgality  (side effects - flu like symptoms, rash), Nurtec PRN (ineffective), Ubrelvy  100mg  (ineffective) Past vitamins/Herbal/Supplements:  none Past antihistamines/decongestants:  none Other past therapies:  none   Other history:  partial hysterectomy.  No history of head trauma. Mother to 4 children Family  history of headache:  Mom (migraines)  PAST MEDICAL HISTORY: Past Medical  History:  Diagnosis Date   Abnormal uterine bleeding (AUB)    Chronic pelvic pain in female    GAD (generalized anxiety disorder)    GERD (gastroesophageal reflux disease)    Left nephrolithiasis    urologist--- dr r.  davis;   last ultrasound in care everywhere 01-04-2023 .  nonobstructive   MDD (major depressive disorder)    Migraines     MEDICATIONS: Current Outpatient Medications on File Prior to Visit  Medication Sig Dispense Refill   AIMOVIG  140 MG/ML SOAJ INJECT 140 MG INTO THE SKIN EVERY 28 DAY. 1 mL 0   esomeprazole  (NEXIUM ) 40 MG capsule TAKE 1 CAPSULE(40 MG) BY MOUTH DAILY (Patient taking differently: Take 40 mg by mouth as needed.) 90 capsule 0   progesterone  (PROMETRIUM ) 100 MG capsule Take 1 capsule (100 mg total) by mouth daily. 30 capsule 1   SUMAtriptan  (IMITREX ) 50 MG tablet TAKE 1 TABLET BY MOUTH EVERY 2 HOURS AS NEEDED FOR HEADACHE. IF PERSISTS MAY REPEAT IN 2 HOURS FOR MIGRAINE ( UP TO 200 MG IN 24 HRS) 12 tablet 0   No current facility-administered medications on file prior to visit.    ALLERGIES: Allergies  Allergen Reactions   Topamax  [Topiramate ] Other (See Comments)    Headaches worsened   Dilaudid  [Hydromorphone  Hcl]     migraine   Adhesive [Tape] Rash    Worse with time, paper tape better    FAMILY HISTORY: Family History  Problem Relation Age of Onset   Cancer Mother    Breast cancer Mother        pt states diagnosed at age 23/40   Thyroid  cancer Mother    Other Mother    Thyroid  disease Mother    Yvone' disease Mother    Stroke Father    Thyroid  disease Maternal Aunt    Thyroid  disease Maternal Grandmother    Drug abuse Brother       Objective:  Blood pressure 128/89, pulse 90, height 5' 7 (1.702 m), weight 180 lb (81.6 kg), last menstrual period 01/10/2023, SpO2 100%. General: No acute distress.  Patient appears well-groomed.    Juliene Dunnings, DO  CC: Debby Molt, MD

## 2023-12-27 ENCOUNTER — Ambulatory Visit: Admitting: Neurology

## 2023-12-27 ENCOUNTER — Encounter: Payer: Self-pay | Admitting: Neurology

## 2023-12-27 VITALS — BP 128/89 | HR 90 | Ht 67.0 in | Wt 180.0 lb

## 2023-12-27 DIAGNOSIS — G43009 Migraine without aura, not intractable, without status migrainosus: Secondary | ICD-10-CM

## 2023-12-27 MED ORDER — SUMATRIPTAN SUCCINATE 100 MG PO TABS: 100.0000 mg | ORAL_TABLET | ORAL | 5 refills | Status: AC | PRN

## 2023-12-27 MED ORDER — QULIPTA 60 MG PO TABS: 60.0000 mg | ORAL_TABLET | Freq: Every day | ORAL | 11 refills | Status: AC

## 2023-12-27 NOTE — Progress Notes (Signed)
 Medication Samples have been provided to the patient.  Drug name: ZavZpret       Strength: 10 mg        Qty: 2  LOT: [id number]  Exp.Date: 10/27  Dosing instructions: as needed  The patient has been instructed regarding the correct time, dose, and frequency of taking this medication, including desired effects and most common side effects.   Catherine Villarreal 11:55 AM 12/27/2023

## 2023-12-27 NOTE — Patient Instructions (Signed)
 Stop Aimovig .  Plan to start Qulipta  60mg  daily.  If no improvement in 3 months, contact me Increase sumatriptan  to 100mg  - if still does not respond to dose, then try the Zavzpret nasal spray (once in 24 hours) and give me update Limit use of pain relievers to no more than 9 days out of the month to prevent risk of rebound or medication-overuse headache. Keep headache diary Follow up 7 months.

## 2024-01-03 DIAGNOSIS — R8290 Unspecified abnormal findings in urine: Secondary | ICD-10-CM | POA: Diagnosis not present

## 2024-01-03 DIAGNOSIS — N200 Calculus of kidney: Secondary | ICD-10-CM | POA: Diagnosis not present

## 2024-01-08 NOTE — Telephone Encounter (Addendum)
 Called the patient and relayed the information per Dr. Nicholaus. Patient stated understanding.      ----- Message from Tanda Nicholaus, MD sent at 01/05/2024  3:53 PM EST ----- I recommend Augmentin  500 mg p.o. 3 times daily #21.  Thanks ----- Message ----- From: Eleanor HERO Pegram Sent: 01/03/2024  12:04 PM EST To: Tanda Jama Nicholaus DOUGLAS, MD

## 2024-01-09 ENCOUNTER — Telehealth: Payer: Self-pay | Admitting: Pharmacy Technician

## 2024-01-09 ENCOUNTER — Other Ambulatory Visit (HOSPITAL_COMMUNITY): Payer: Self-pay

## 2024-01-09 NOTE — Telephone Encounter (Signed)
 Pharmacy Patient Advocate Encounter   Received notification from CoverMyMeds that prior authorization for QULIPTA  60MG  is required/requested.   Insurance verification completed.   The patient is insured through [facility] MEDICAID.   Per test claim: PA required; PA submitted to above mentioned insurance via Latent Key/confirmation #/EOC [facility] Status is pending

## 2024-01-11 ENCOUNTER — Other Ambulatory Visit (HOSPITAL_COMMUNITY): Payer: Self-pay

## 2024-01-11 NOTE — Telephone Encounter (Signed)
 Pharmacy Patient Advocate Encounter  Received notification from [employer] that Prior Authorization for QULIPTA  60MG  has been APPROVED from 11.19.25 to 2.19.26   PA #/Case ID/Reference #: [id number]

## 2024-01-15 ENCOUNTER — Ambulatory Visit: Admitting: Internal Medicine

## 2024-01-15 ENCOUNTER — Other Ambulatory Visit

## 2024-01-15 ENCOUNTER — Encounter: Payer: Self-pay | Admitting: Internal Medicine

## 2024-01-15 VITALS — BP 112/70 | Ht 67.0 in | Wt 181.0 lb

## 2024-01-15 DIAGNOSIS — R5383 Other fatigue: Secondary | ICD-10-CM | POA: Diagnosis not present

## 2024-01-15 DIAGNOSIS — R7989 Other specified abnormal findings of blood chemistry: Secondary | ICD-10-CM

## 2024-01-15 DIAGNOSIS — R6882 Decreased libido: Secondary | ICD-10-CM

## 2024-01-15 NOTE — Progress Notes (Signed)
 Name: Catherine Villarreal  MRN/ DOB: [id number], 08-17-1990    Age/ Sex: 33 y.o., female    PCP: Joshua Debby CROME, MD   Reason for Endocrinology Evaluation: Elevated testosterone      Date of Initial Endocrinology Evaluation: 01/15/2024     HPI: Catherine Villarreal is a 33 y.o. female with a past medical history of migraine headaches, chronic fatigue, depression. The patient presented for initial endocrinology clinic visit on 01/15/2024 for consultative assistance with her elevated testosterone .   Patient was referred here for further evaluation of elevated testosterone  that was noted on her labs from September, 2025 at 48.23 NG/DL (reference [id number]).  The patient had an estrogen level of 544 at the time   TPO antibodies were negative.  TFTs were normal  Patient follows with behavioral health for depression/GAD , she is not on any meds for over a year, due to resistant depression  Patient follows with neurology for migraine headaches  Mother with Hashimoto/Graves' disease  Has partial hysterectomy 01/2023 due to menorrhagia and pain - [facility]  Has rare facial acne , only when she uses certain topical products No hirsutism No facial plucking or shaving of face  Menarche 12 She did have monthly menstruations , no missed appointments She doesn't sleep well , she is tired  She is on progesterone  through her PCP's office but caused breast pain  She is also c/o fatigue, diarrhea, constipation , dry skin, low libido, mood changes . Cold sensitivity . No nipple discharge  Has 1 biological child 8 yr Weight fluctuates      HISTORY:  Past Medical History:  Past Medical History:  Diagnosis Date   Abnormal uterine bleeding (AUB)    Chronic pelvic pain in female    GAD (generalized anxiety disorder)    GERD (gastroesophageal reflux disease)    Left nephrolithiasis    urologist--- dr r.  davis;   last ultrasound in care everywhere 01-04-2023 .  nonobstructive   MDD (major  depressive disorder)    Migraines    Past Surgical History:  Past Surgical History:  Procedure Laterality Date   CYSTOSCOPY N/A 01/31/2023   Procedure: CYSTOSCOPY;  Surgeon: Jeralyn Crutch, MD;  Location: [facility];  Service: Gynecology;  Laterality: N/A;   LAPAROSCOPIC CHOLECYSTECTOMY  2017   dr gross   TOTAL LAPAROSCOPIC HYSTERECTOMY WITH SALPINGECTOMY Bilateral 01/31/2023   Procedure: TOTAL LAPAROSCOPIC HYSTERECTOMY WITH SALPINGECTOMY;  Surgeon: Jeralyn Crutch, MD;  Location: [facility];  Service: Gynecology;  Laterality: Bilateral;    Social History:  reports that she has quit smoking. Her smoking use included cigarettes. She has never used smokeless tobacco. She reports current alcohol use. She reports that she does not currently use drugs. Family History: family history includes Breast cancer in her mother; Cancer in her mother; Drug abuse in her brother; Yvone' disease in her mother; Other in her mother; Stroke in her father; Thyroid  cancer in her mother; Thyroid  disease in her maternal aunt, maternal grandmother, and mother.   HOME MEDICATIONS: Allergies as of 01/15/2024       Reactions   Topamax  [topiramate ] Other (See Comments)   Headaches worsened   Dilaudid  [hydromorphone  Hcl]    migraine   Adhesive [tape] Rash   Worse with time, paper tape better        Medication List        Accurate as of January 15, 2024  2:13 PM. If you have any questions, ask your nurse  or doctor.          esomeprazole  40 MG capsule Commonly known as: NEXIUM  TAKE 1 CAPSULE(40 MG) BY MOUTH DAILY What changed: See the new instructions.   progesterone  100 MG capsule Commonly known as: PROMETRIUM  Take 1 capsule (100 mg total) by mouth daily.   Qulipta  60 MG Tabs Generic drug: Atogepant  Take 1 tablet (60 mg total) by mouth daily.   SUMAtriptan  100 MG tablet Commonly known as: IMITREX  Take 1 tablet (100 mg total) by mouth as needed for  migraine. May repeat in 2 hours if headache persists or recurs.  Maximum 2 tablets in 24 hours.          REVIEW OF SYSTEMS: A comprehensive ROS was conducted with the patient and is negative except as per HPI     OBJECTIVE:  VS: BP 112/70   Ht 5' 7 (1.702 m)   Wt 181 lb (82.1 kg)   LMP 01/10/2023 (Approximate)   BMI 28.35 kg/m    Wt Readings from Last 3 Encounters:  01/15/24 181 lb (82.1 kg)  12/27/23 180 lb (81.6 kg)  11/05/23 178 lb 9.6 oz (81 kg)     EXAM: General: Pt appears well and is in NAD No acne no hirsutism  Eyes: External eye exam normal without stare, lid lag or exophthalmos.  EOM intact.  Neck: General: Supple without adenopathy. Thyroid : Thyroid  size normal.  No goiter or nodules appreciated.  Lungs: Clear with good BS bilat   Heart: Auscultation: RRR.  Abdomen: Soft, nontender  Extremities:  BL LE: No pretibial edema   Mental Status: Judgment, insight: Intact Orientation: Oriented to time, place, and person Mood and affect: No depression, anxiety, or agitation     DATA REVIEWED:  Latest Reference Range & Units 01/15/24 15:03  Prolactin ng/mL 2.6  Testosterone , Total, LC-MS-MS 2 - 45 ng/dL 20  TSH mIU/L 9.11  Triiodothyronine,Free,Serum 2.3 - 4.2 pg/mL 3.4  T4,Free(Direct) 0.8 - 1.8 ng/dL 1.3        ASSESSMENT/PLAN/RECOMMENDATIONS:   Elevated Testosterone :   - Patient is not symptomatic with no acne no hirsutism - Patient does NOT meet criteria for PCOS - Testosterone  through LC-MS-MS is within normal range   2.  Fatigue:  - With untreated depression, anxiety, and sleep issues which could be a major contributing factors - She does have family history of Hashimoto's thyroiditis, TPO antibodies were undetectable through PCPs office, she did have normal TSH and free T4, but a low T3 uptake ratio but I did explain to the patient I will check her TSH, free T4, and free T3.  There are other factors that could contribute to the results of  a ratio - I will screen her for Cushing syndrome with 24-hour urinary cortisol - TPO antibodies undetectable - TFTs normal   3.  Decreased libido:  - No galactorrhea - Prolactin level is normal    4.  Treatment resistant depression:  - She has been without antidepressants for approximately a year.  The patient has tried multiple agents without help.  I did advise the patient to seek a second opinion from a treatment resistant depression specialist    Medications :  Signed electronically by: Stefano Redgie Butts, MD  [facility] Endocrinology  [facility] [address] [place], [place] [address] Phone: [phone] FAX: [phone]   CC: Joshua Debby CROME, MD [address] [place] [address] Phone: [phone] Fax: [phone]   Return to Endocrinology clinic as below: Future  Appointments  Date Time Provider Department Center  03/12/2024  1:20 PM Vickey Mettle, MD [facility] None  07/28/2024 10:30 AM Skeet Juliene SAUNDERS, DO [facility] None

## 2024-01-15 NOTE — Patient Instructions (Signed)
 24-Hour Urine Collection   You will be collecting your urine for a 24-hour period of time.  Your timer starts with your first urine of the morning (For example - If you first pee at 9AM, your timer will start at 9AM)  Throw away your first urine of the morning  Collect your urine every time you pee for the next 24 hours STOP your urine collection 24 hours after you started the collection (For example - You would stop at 9AM the day after you started)

## 2024-01-19 LAB — PROLACTIN: Prolactin: 2.6 ng/mL

## 2024-01-19 LAB — TESTOSTERONE, TOTAL, LC/MS/MS: Testosterone, Total, LC-MS-MS: 20 ng/dL (ref 2–45)

## 2024-01-19 LAB — T4, FREE: Free T4: 1.3 ng/dL (ref 0.8–1.8)

## 2024-01-19 LAB — T3, FREE: T3, Free: 3.4 pg/mL (ref 2.3–4.2)

## 2024-01-19 LAB — TSH: TSH: 0.88 m[IU]/L

## 2024-01-21 ENCOUNTER — Ambulatory Visit: Payer: Self-pay | Admitting: Internal Medicine

## 2024-02-08 ENCOUNTER — Other Ambulatory Visit (HOSPITAL_COMMUNITY): Payer: Self-pay
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
## 2024-03-12 ENCOUNTER — Ambulatory Visit: Admitting: Family Medicine

## 2024-03-12 ENCOUNTER — Telehealth: Admitting: Psychiatry

## 2024-03-12 ENCOUNTER — Encounter: Payer: Self-pay | Admitting: Psychiatry

## 2024-03-12 ENCOUNTER — Other Ambulatory Visit: Payer: Self-pay

## 2024-03-12 VITALS — BP 114/79 | HR 77 | Wt 178.8 lb

## 2024-03-12 DIAGNOSIS — F330 Major depressive disorder, recurrent, mild: Secondary | ICD-10-CM | POA: Diagnosis not present

## 2024-03-12 DIAGNOSIS — N649 Disorder of breast, unspecified: Secondary | ICD-10-CM | POA: Diagnosis not present

## 2024-03-12 DIAGNOSIS — R5383 Other fatigue: Secondary | ICD-10-CM

## 2024-03-12 NOTE — Patient Instructions (Signed)
 Obtain pharmacogenetic testing Referral to [facility] myalgic encephalomyelitis/chronic fatigue syndrome ME/CFS care clinic  Next appointment- 3/11 at 9 AM

## 2024-03-12 NOTE — Progress Notes (Unsigned)
" ° ° °  Subjective:  Catherine Villarreal is a 34 y.o. female who presents to the clinic today for right breast lesion  HPI: Patient presenting for concern for right breast lesion.  She reports that for the last few days she has noticed erythema and itching of her right breast on the medial aspect of it.  It is painful and she reports lumpy.  She has family history of breast cancer with her mom having breast cancer at 48 years old.  She is extremely concerned and wants further evaluation.  Objective:  Physical Exam: BP 114/79   Pulse 77   Wt 178 lb 12.8 oz (81.1 kg)   LMP 01/10/2023   BMI 28.00 kg/m   Gen: Alert, well-appearing, no acute distress CV regular rate and rhythm Pulm: Normal work of breathing, speaking in full sentences Breasts: Right breast with erythematous lesion measuring approximately 4 cm x 4 cm at the 5 o'clock position of the breast.  It is also warm to touch.  No nipple dimpling.  No axillary lymphadenopathy noted. MSK: no edema, cyanosis, or clubbing noted Skin: warm, dry Neuro: grossly normal, moves all extremities Psych: Normal affect and thought content  No results found for this or any previous visit (from the past 72 hours).   Assessment/Plan:  Breast lesion Right breast mass with erythema and itching.  No drainage appreciated, no dimpling appreciated.  Patient is extremely concerned about breast cancer given family history at a young age.  Mammogram ordered.  Will follow-up after results.   Lab Orders  No laboratory test(s) ordered today    No orders of the defined types were placed in this encounter.     Steffan Rover, MD Attending Family Medicine Physician, [facility] for [facility], [facility]   03/13/24 7:00 PM  "

## 2024-03-13 ENCOUNTER — Other Ambulatory Visit: Payer: Self-pay | Admitting: Family Medicine

## 2024-03-13 DIAGNOSIS — N649 Disorder of breast, unspecified: Secondary | ICD-10-CM

## 2024-03-13 NOTE — Assessment & Plan Note (Addendum)
 Right breast mass with erythema and itching.  No drainage appreciated, no dimpling appreciated.  Patient is extremely concerned about breast cancer given family history at a young age.  Mammogram ordered.  Will follow-up after results.

## 2024-03-14 ENCOUNTER — Encounter: Payer: Self-pay | Admitting: Psychiatry

## 2024-03-25 ENCOUNTER — Encounter (HOSPITAL_COMMUNITY): Payer: Self-pay | Admitting: *Deleted

## 2024-03-25 ENCOUNTER — Emergency Department (HOSPITAL_COMMUNITY)

## 2024-03-25 ENCOUNTER — Emergency Department (HOSPITAL_COMMUNITY)
Admission: EM | Admit: 2024-03-25 | Discharge: 2024-03-25 | Disposition: A | Discharge status: Home / Self Care | Attending: Emergency Medicine | Admitting: Emergency Medicine

## 2024-03-25 ENCOUNTER — Other Ambulatory Visit: Payer: Self-pay

## 2024-03-25 DIAGNOSIS — S86111A Strain of other muscle(s) and tendon(s) of posterior muscle group at lower leg level, right leg, initial encounter: Secondary | ICD-10-CM | POA: Insufficient documentation

## 2024-03-25 DIAGNOSIS — W07XXXA Fall from chair, initial encounter: Secondary | ICD-10-CM | POA: Insufficient documentation

## 2024-03-25 NOTE — ED Provider Notes (Signed)
 " [place] EMERGENCY DEPARTMENT AT [facility] Provider Note   CSN: [id number] Arrival date & time: 03/25/24  8258     Patient presents with: Leg Pain   Catherine Villarreal is a 34 y.o. female.   Pt is a 34 yo female with pmhx significant for migraines, anxiety, kidney stones, depression, and GERD.  Pt was stepping down from a chair and felt a pop in her right calf.  She has been unable to lift her foot since.  She has been nonambulatory.       Prior to Admission medications  Medication Sig Start Date End Date Taking? Authorizing Provider  Atogepant  (QULIPTA ) 60 MG TABS Take 1 tablet (60 mg total) by mouth daily. 12/27/23   Skeet Juliene SAUNDERS, DO  esomeprazole  (NEXIUM ) 40 MG capsule TAKE 1 CAPSULE(40 MG) BY MOUTH DAILY Patient taking differently: Take 40 mg by mouth as needed. 02/22/23   Joshua Debby CROME, MD  progesterone  (PROMETRIUM ) 100 MG capsule Take 1 capsule (100 mg total) by mouth daily. 11/22/23   Alvia Corean CROME, FNP  SUMAtriptan  (IMITREX ) 100 MG tablet Take 1 tablet (100 mg total) by mouth as needed for migraine. May repeat in 2 hours if headache persists or recurs.  Maximum 2 tablets in 24 hours. 12/27/23   Skeet Juliene SAUNDERS, DO    Allergies: Topamax  [topiramate ], Dilaudid  [hydromorphone  hcl], and Adhesive [tape]    Review of Systems  Musculoskeletal:        Right calf pain  All other systems reviewed and are negative.   Updated Vital Signs BP 107/80   Pulse 70   Temp 98.5 F (36.9 C) (Oral)   Resp 14   Ht 5' 7 (1.702 m)   Wt 81.1 kg   LMP 01/10/2023   SpO2 94%   BMI 28.00 kg/m   Physical Exam Vitals and nursing note reviewed.  Constitutional:      Appearance: Normal appearance.  HENT:     Head: Normocephalic and atraumatic.     Right Ear: External ear normal.     Left Ear: External ear normal.     Nose: Nose normal.     Mouth/Throat:     Mouth: Mucous membranes are moist.  Eyes:     Extraocular Movements: Extraocular movements intact.      Conjunctiva/sclera: Conjunctivae normal.     Pupils: Pupils are equal, round, and reactive to light.  Cardiovascular:     Rate and Rhythm: Normal rate and regular rhythm.     Pulses: Normal pulses.     Heart sounds: Normal heart sounds.  Pulmonary:     Effort: Pulmonary effort is normal.     Breath sounds: Normal breath sounds.  Abdominal:     General: Abdomen is flat. Bowel sounds are normal.     Palpations: Abdomen is soft.  Musculoskeletal:     Cervical back: Normal range of motion and neck supple.       Legs:     Comments: No achilles tenderness.    Skin:    General: Skin is warm.     Capillary Refill: Capillary refill takes less than 2 seconds.  Neurological:     General: No focal deficit present.     Mental Status: She is alert and oriented to person, place, and time.  Psychiatric:        Mood and Affect: Mood normal.        Behavior: Behavior normal.     (all labs ordered are listed, but  only abnormal results are displayed) Labs Reviewed - No data to display  EKG: None  Radiology: CT Tibia Fibula Right Wo Contrast Result Date: 03/25/2024 EXAM: CT RIGHT LOWER EXTREMITY, WITHOUT IV CONTRAST 03/25/2024 09:54:08 PM TECHNIQUE: Axial images were acquired through the right lower extremity without IV contrast. Reformatted images were reviewed. Automated exposure control, iterative reconstruction, and/or weight based adjustment of the mA/kV was utilized to reduce the radiation dose to as low as reasonably achievable. COMPARISON: None available. CLINICAL HISTORY: Lower leg trauma. FINDINGS: BONES AND JOINTS: Ostrigonum present. No acute fracture or focal osseous lesion. No dislocation. The joint spaces are normal. SOFT TISSUES: The soft tissues are unremarkable. IMPRESSION: 1. No acute osseous abnormality of the lower leg. 2. Os trigonum. Electronically signed by: Greig Pique MD 03/25/2024 09:59 PM EST RP Workstation: [id number]   DG Tibia/Fibula Right Result Date:  03/25/2024 CLINICAL DATA:  Pain after stepping off a chair. EXAM: RIGHT TIBIA AND FIBULA - 2 VIEW COMPARISON:  None Available. FINDINGS: There is no evidence of fracture or other focal bone lesions. The cortical margins of the tibia and fibula are intact. Knee and ankle alignment are maintained. Soft tissues are unremarkable. IMPRESSION: Negative radiographs of the right lower leg. Electronically Signed   By: Andrea Gasman M.D.   On: 03/25/2024 21:28     Procedures   Medications Ordered in the ED - No data to display                                  Medical Decision Making Amount and/or Complexity of Data Reviewed Radiology: ordered.   This patient presents to the ED for concern of right lower leg pain, this involves an extensive number of treatment options, and is a complaint that carries with it a high risk of complications and morbidity.  The differential diagnosis includes fx, sprain, tendon injury   Co morbidities that complicate the patient evaluation   migraines, anxiety, kidney stones, depression, and GERD   Additional history obtained:  Additional history obtained from epic chart review   Imaging Studies ordered:  I ordered imaging studies including right tib fib xr and ct  I independently visualized and interpreted imaging which showed  R tib/fib xr: Negative radiographs of the right lower leg.  CT: No acute osseous abnormality of the lower leg.  2. Os trigonum.   I agree with the radiologist interpretation   Medicines ordered and prescription drug management:   I have reviewed the patients home medicines and have made adjustments as needed   Test Considered:  d5   Problem List / ED Course:  Calf pain:  I suspect partial gastroc muscle tear.  She is placed in an ace wrap and is given crutches.  She is to f/u with ortho.  She did not want anything for pain her or at home.  She knows to return if worse.    Reevaluation:  After the interventions  noted above, I reevaluated the patient and found that they have :improved   Social Determinants of Health:  Lives at home   Dispostion:  After consideration of the diagnostic results and the patients response to treatment, I feel that the patent would benefit from discharge with outpatient f/u.       Final diagnoses:  Gastrocnemius tear, right, initial encounter    ED Discharge Orders     None          Drakkar Medeiros,  Mliss, MD 03/25/24 2238  "

## 2024-03-25 NOTE — ED Notes (Addendum)
 This RN educated the pt on the ace wrap that was placed on her right calf. The pt is showed understand by verbalizing what she would do to decrease tingling in her leg or foot. Pt was educated on the use of crutches. Pt showed understanding by showing the RN how to use them.

## 2024-03-27 ENCOUNTER — Ambulatory Visit

## 2024-03-27 ENCOUNTER — Ambulatory Visit
Admission: RE | Admit: 2024-03-27 | Discharge: 2024-03-27 | Disposition: A | Source: Ambulatory Visit | Attending: Family Medicine

## 2024-03-27 DIAGNOSIS — N649 Disorder of breast, unspecified: Secondary | ICD-10-CM

## 2024-03-28 ENCOUNTER — Ambulatory Visit: Admitting: Orthopedic Surgery

## 2024-03-28 VITALS — BP 110/78 | HR 73 | Ht 67.0 in | Wt 178.0 lb

## 2024-03-28 DIAGNOSIS — S86111A Strain of other muscle(s) and tendon(s) of posterior muscle group at lower leg level, right leg, initial encounter: Secondary | ICD-10-CM

## 2024-03-28 NOTE — Progress Notes (Unsigned)
 New Patient Visit  Summary: Catherine Villarreal is a 34 y.o. female with the following: 1. Strain of right gastrocnemius muscle, initial encounter ***   Assessment and Plan Assessment & Plan Gastrocnemius muscle tear Acute gastrocnemius muscle belly tear with intact Achilles tendon, excluding rupture. Self-limited injury with expected gradual improvement and full recovery. No significant complications noted. - Advised discontinuation of crutches as tolerated and gradual weight bearing within pain limits. - Recommended compression wrap or sleeve for comfort. - Advised use of ice for symptomatic relief. - Recommended over-the-counter analgesics such as acetaminophen  or ibuprofen . - Discussed short course of corticosteroids for refractory symptoms if needed. - Discussed possible referral to physical therapy if recovery is delayed or symptoms persist. - Provided reassurance regarding intact Achilles tendon and expected recovery. - Advised to contact clinic if symptoms worsen or additional assistance is needed.     Follow-up: Return if symptoms worsen or fail to improve.  Subjective:  Chief Complaint  Patient presents with   Leg Pain    R calf after stepping off a chair and felt a pop and immediate pain DOI 03/25/24. Can only toe touch on the R side any movement past that point causes pain.      Discussed the use of AI scribe software for clinical note transcription with the patient, who gave verbal consent to proceed.  History of Present Illness Catherine Villarreal is a 34 year old female who presents with acute left calf pain and swelling following a gastrocnemius muscle tear.  Two days ago she stepped off a chair and felt an immediate pop in the lateral left calf with sharp pain, swelling, and ecchymosis, most pronounced in the mid-calf.  She ambulates with crutches. She can toe bear but cannot place the foot flat due to pain. Pain worsens with pressure and attempted dorsiflexion. Symptoms  have slightly improved since the day after injury but she still has pain with weight-bearing and certain movements. She denies numbness, tingling, or other neurologic symptoms.  She is using a wrap for comfort and has acetaminophen  and ibuprofen  available at home. No other treatments have been used.    Review of Systems: No fevers or chills*** No numbness or tingling No chest pain No shortness of breath No bowel or bladder dysfunction No GI distress No headaches   Medical History:  Past Medical History:  Diagnosis Date   Abnormal uterine bleeding (AUB)    Chronic pelvic pain in female    GAD (generalized anxiety disorder)    GERD (gastroesophageal reflux disease)    Left nephrolithiasis    urologist--- dr r.  davis;   last ultrasound in care everywhere 01-04-2023 .  nonobstructive   MDD (major depressive disorder)    Migraines     Past Surgical History:  Procedure Laterality Date   CYSTOSCOPY N/A 01/31/2023   Procedure: CYSTOSCOPY;  Surgeon: Jeralyn Crutch, MD;  Location: [facility];  Service: Gynecology;  Laterality: N/A;   LAPAROSCOPIC CHOLECYSTECTOMY  2017   dr gross   TOTAL LAPAROSCOPIC HYSTERECTOMY WITH SALPINGECTOMY Bilateral 01/31/2023   Procedure: TOTAL LAPAROSCOPIC HYSTERECTOMY WITH SALPINGECTOMY;  Surgeon: Jeralyn Crutch, MD;  Location: [facility];  Service: Gynecology;  Laterality: Bilateral;    Family History  Problem Relation Age of Onset   Cancer Mother    Breast cancer Mother        pt states diagnosed at age 35/40   Thyroid  cancer Mother    Other Mother    Thyroid  disease Mother  Graves' disease Mother    Stroke Father    Thyroid  disease Maternal Aunt    Thyroid  disease Maternal Grandmother    Drug abuse Brother    Social History[1]  Allergies[2]  Active Medications[3]  Objective: BP 110/78   Pulse 73   Ht 5' 7 (1.702 m)   Wt 178 lb (80.7 kg)   LMP 01/10/2023   BMI 27.88 kg/m   Physical  Exam:    General: {General PE Findings:25791} Gait: {Gait:25792}  Physical Exam MUSCULOSKELETAL: Achilles tendon intact. Tenderness in the middle of the calf.   IMAGING: {XR Reviewed:24899}     New Medications:  No orders of the defined types were placed in this encounter.     Portions of this note were completed via Scientist, clinical (histocompatibility and immunogenetics).  Oneil DELENA Horde, MD  03/28/2024 11:20 AM      [1]  Social History Tobacco Use   Smoking status: Former    Current packs/day: 0.75    Types: Cigarettes   Smokeless tobacco: Never   Tobacco comments:    01-25-2023  pt stated quit smoking 2022,  started 2009  Vaping Use   Vaping status: Every Day   Substances: Nicotine , Flavoring, Nicotine -salt   Devices: geek bar  Substance Use Topics   Alcohol use: Yes    Comment: very rare   Drug use: Not Currently  [2]  Allergies Allergen Reactions   Topamax  [Topiramate ] Other (See Comments)    Headaches worsened   Dilaudid  [Hydromorphone  Hcl]     migraine   Adhesive [Tape] Rash    Worse with time, paper tape better  [3]  No outpatient medications have been marked as taking for the 03/28/24 encounter (Office Visit) with Horde Oneil DELENA, MD.

## 2024-04-30 ENCOUNTER — Telehealth: Admitting: Psychiatry

## 2024-07-28 ENCOUNTER — Ambulatory Visit: Admitting: Neurology
